# Patient Record
Sex: Male | Born: 1985
Health system: Southern US, Community
[De-identification: ages and names within clinical notes are randomized; demographics above are authoritative.]

## PROBLEM LIST (undated history)

## (undated) DIAGNOSIS — K5190 Ulcerative colitis, unspecified, without complications: Secondary | ICD-10-CM

## (undated) DIAGNOSIS — F419 Anxiety disorder, unspecified: Secondary | ICD-10-CM

## (undated) DIAGNOSIS — Z21 Asymptomatic human immunodeficiency virus [HIV] infection status: Secondary | ICD-10-CM

## (undated) DIAGNOSIS — E559 Vitamin D deficiency, unspecified: Secondary | ICD-10-CM

## (undated) DIAGNOSIS — F329 Major depressive disorder, single episode, unspecified: Secondary | ICD-10-CM

## (undated) DIAGNOSIS — Z8619 Personal history of other infectious and parasitic diseases: Secondary | ICD-10-CM

## (undated) DIAGNOSIS — F32A Depression, unspecified: Secondary | ICD-10-CM

## (undated) DIAGNOSIS — B20 Human immunodeficiency virus [HIV] disease: Secondary | ICD-10-CM

## (undated) HISTORY — DX: Major depressive disorder, single episode, unspecified: F32.9

## (undated) HISTORY — DX: Depression, unspecified: F32.A

## (undated) HISTORY — DX: Ulcerative colitis, unspecified, without complications: K51.90

## (undated) HISTORY — DX: Vitamin D deficiency, unspecified: E55.9

## (undated) HISTORY — DX: Anxiety disorder, unspecified: F41.9

## (undated) HISTORY — DX: Personal history of other infectious and parasitic diseases: Z86.19

## (undated) HISTORY — DX: Human immunodeficiency virus (HIV) disease: B20

## (undated) HISTORY — DX: Asymptomatic human immunodeficiency virus (hiv) infection status: Z21

## (undated) MED FILL — Emtricitabine-Rilpivirine-Tenofovir AF Tab 200-25-25 MG: ORAL | Fill #0 | Status: CN

---

## 2005-07-27 ENCOUNTER — Emergency Department: Payer: Self-pay | Admitting: Emergency Medicine

## 2005-10-31 ENCOUNTER — Emergency Department (HOSPITAL_COMMUNITY): Admission: EM | Admit: 2005-10-31 | Discharge: 2005-10-31 | Payer: Self-pay | Admitting: Family Medicine

## 2005-11-01 ENCOUNTER — Emergency Department: Payer: Self-pay | Admitting: Emergency Medicine

## 2006-02-16 ENCOUNTER — Ambulatory Visit: Payer: Self-pay | Admitting: Gastroenterology

## 2006-03-16 ENCOUNTER — Ambulatory Visit: Payer: Self-pay | Admitting: Gastroenterology

## 2007-08-27 ENCOUNTER — Emergency Department: Payer: Self-pay | Admitting: Emergency Medicine

## 2008-03-11 ENCOUNTER — Emergency Department: Payer: Self-pay | Admitting: Internal Medicine

## 2008-03-13 ENCOUNTER — Emergency Department: Payer: Self-pay | Admitting: Unknown Physician Specialty

## 2009-05-31 ENCOUNTER — Emergency Department: Payer: Self-pay | Admitting: Emergency Medicine

## 2009-06-29 ENCOUNTER — Ambulatory Visit: Payer: Self-pay | Admitting: Family

## 2009-07-03 ENCOUNTER — Emergency Department (HOSPITAL_COMMUNITY): Admission: EM | Admit: 2009-07-03 | Discharge: 2009-07-03 | Payer: Self-pay | Admitting: Emergency Medicine

## 2009-07-06 ENCOUNTER — Ambulatory Visit: Payer: Self-pay | Admitting: Infectious Diseases

## 2009-07-06 DIAGNOSIS — K5190 Ulcerative colitis, unspecified, without complications: Secondary | ICD-10-CM | POA: Insufficient documentation

## 2009-07-06 DIAGNOSIS — R509 Fever, unspecified: Secondary | ICD-10-CM | POA: Insufficient documentation

## 2009-07-06 LAB — CONVERTED CEMR LAB
Albumin: 3.8 g/dL (ref 3.5–5.2)
BUN: 8 mg/dL (ref 6–23)
CO2: 30 meq/L (ref 19–32)
Eosinophils Absolute: 0.1 10*3/uL (ref 0.0–0.7)
Eosinophils Relative: 1 % (ref 0–5)
HIV 1 RNA Quant: 355000 copies/mL — ABNORMAL HIGH (ref <48)
HIV-1 RNA Quant, Log: 5.55 — ABNORMAL HIGH (ref <1.68)
HIV: REACTIVE
Lymphs Abs: 0.8 10*3/uL (ref 0.7–4.0)
MCHC: 31.3 g/dL (ref 30.0–36.0)
Monocytes Absolute: 0.5 10*3/uL (ref 0.1–1.0)
Monocytes Relative: 6 % (ref 3–12)
Platelets: 127 10*3/uL — ABNORMAL LOW (ref 150–400)
Potassium: 3.7 meq/L (ref 3.5–5.3)
RBC: 4.26 M/uL (ref 4.22–5.81)
Sodium: 138 meq/L (ref 135–145)
Total Protein: 7.4 g/dL (ref 6.0–8.3)
WBC: 8.2 10*3/uL (ref 4.0–10.5)

## 2009-07-09 ENCOUNTER — Ambulatory Visit (HOSPITAL_COMMUNITY): Admission: RE | Admit: 2009-07-09 | Discharge: 2009-07-09 | Payer: Self-pay | Admitting: Infectious Diseases

## 2009-07-13 ENCOUNTER — Ambulatory Visit: Payer: Self-pay | Admitting: Infectious Diseases

## 2009-07-13 DIAGNOSIS — B20 Human immunodeficiency virus [HIV] disease: Secondary | ICD-10-CM | POA: Insufficient documentation

## 2009-07-13 LAB — CONVERTED CEMR LAB
Basophils Absolute: 0.1 10*3/uL (ref 0.0–0.1)
Basophils Relative: 1 % (ref 0–1)
Chlamydia, Swab/Urine, PCR: NEGATIVE
Eosinophils Relative: 0 % (ref 0–5)
GC Probe Amp, Urine: NEGATIVE
HCT: 34.4 % — ABNORMAL LOW (ref 39.0–52.0)
HCV Ab: NEGATIVE
HDL: 23 mg/dL — ABNORMAL LOW (ref >39)
Monocytes Absolute: 0.8 10*3/uL (ref 0.1–1.0)
Monocytes Relative: 9 % (ref 3–12)
Neutrophils Relative %: 77 % (ref 43–77)
RBC: 4.01 M/uL — ABNORMAL LOW (ref 4.22–5.81)
VLDL: 39 mg/dL (ref 0–40)
WBC: 8.7 10*3/uL (ref 4.0–10.5)

## 2009-07-22 ENCOUNTER — Ambulatory Visit: Payer: Self-pay | Admitting: Infectious Diseases

## 2009-07-29 ENCOUNTER — Telehealth: Payer: Self-pay

## 2009-08-21 ENCOUNTER — Encounter: Payer: Self-pay | Admitting: Infectious Diseases

## 2009-08-25 ENCOUNTER — Encounter: Payer: Self-pay | Admitting: Infectious Diseases

## 2009-09-02 ENCOUNTER — Ambulatory Visit: Payer: Self-pay | Admitting: Infectious Diseases

## 2009-09-02 LAB — CONVERTED CEMR LAB
HIV 1 RNA Quant: 63 copies/mL — ABNORMAL HIGH (ref <48)
HIV-1 RNA Quant, Log: 1.8 — ABNORMAL HIGH (ref <1.68)

## 2009-09-03 ENCOUNTER — Encounter: Payer: Self-pay | Admitting: Infectious Diseases

## 2009-11-02 ENCOUNTER — Ambulatory Visit: Payer: Self-pay | Admitting: Infectious Diseases

## 2010-01-07 ENCOUNTER — Encounter (INDEPENDENT_AMBULATORY_CARE_PROVIDER_SITE_OTHER): Payer: Self-pay | Admitting: *Deleted

## 2010-03-02 ENCOUNTER — Ambulatory Visit: Payer: Self-pay | Admitting: Infectious Diseases

## 2010-03-15 ENCOUNTER — Ambulatory Visit: Payer: Self-pay | Admitting: Infectious Diseases

## 2010-03-23 ENCOUNTER — Encounter: Payer: Self-pay | Admitting: Infectious Diseases

## 2010-05-03 ENCOUNTER — Telehealth: Payer: Self-pay | Admitting: Infectious Diseases

## 2010-05-18 NOTE — Assessment & Plan Note (Signed)
Summary: 70MONTH F/U/VS   CC:  2 month follow up.  History of Present Illness: 25 yo M with a hx of Ulcerative Coliits. Seen in ID clinic for FUO and found to have CD4 10 and VL 355,000 (07-06-09). Geno naive. Started on atripla 07-22-09.  CD4 40 and VL 63 (09-02-09). no problems. working. no problems with medicine. has some timing irregularity.   Preventive Screening-Counseling & Management  Alcohol-Tobacco     Alcohol drinks/day: 0     Smoking Status: never  Caffeine-Diet-Exercise     Caffeine use/day:  sodas and tea     Does Patient Exercise: no  Safety-Violence-Falls     Seat Belt Use: yes  Current Medications (verified): 1)  Atripla 600-200-300 Mg Tabs (Efavirenz-Emtricitab-Tenofovir) .Marland Kitchen.. 1 Tab At Bedtime  Allergies (verified): No Known Drug Allergies    Updated Prior Medication List: ATRIPLA 600-200-300 MG TABS (EFAVIRENZ-EMTRICITAB-TENOFOVIR) 1 tab at bedtime  Current Allergies (reviewed today): No known allergies  Past History:  Past medical, surgical, family and social histories (including risk factors) reviewed, and no changes noted (except as noted below).  Past Medical History: Reviewed history from 07/13/2009 and no changes required. Current Problems:  UNSPECIFIED ULCERATIVE COLITIS (ICD-556.9) FEVER UNSPECIFIED (ICD-780.60) HIV disease  Family History: Reviewed history from 07/06/2009 and no changes required. Family History Hypertension  Social History: Reviewed history from 07/06/2009 and no changes required. Never Smoked Alcohol use-no here with girlfriend.   Review of Systems       wt steady, eating well,   Vital Signs:  Patient profile:   25 year old male Height:      71 inches (180.34 cm) Weight:      151.8 pounds (69 kg) BMI:     21.25 Temp:     98.1 degrees F (36.72 degrees C) oral Pulse rate:   86 / minute BP sitting:   117 / 72  (left arm)  Vitals Entered By: Rocky Morel) (November 02, 2009 10:02 AM) CC: 2 month follow  up Pain Assessment Patient in pain? no      Nutritional Status Detail appetite is good per patient  Have you ever been in a relationship where you felt threatened, hurt or afraid?Unable to ask-friend in the room   Does patient need assistance? Functional Status Self care Ambulation Normal        Medication Adherence: 11/02/2009   Adherence to medications reviewed with patient. Counseling to provide adequate adherence provided   Prevention For Positives: 11/02/2009   Safe sex practices discussed with patient. Condoms offered.                             Physical Exam  General:  well-developed, well-nourished, and well-hydrated.   Eyes:  pupils equal, pupils round, and pupils reactive to light.   Mouth:  pharynx pink and moist and no exudates.   Neck:  no masses.   Lungs:  normal respiratory effort and normal breath sounds.   Heart:  normal rate, regular rhythm, and no murmur.   Abdomen:  soft, non-tender, and normal bowel sounds.     Impression & Recommendations:  Problem # 1:  HIV DISEASE (ICD-042) he is doing well with his meds. unfotunately is not recontituting his immune system. would like his chart to be made anonymous. return to clinic 4 months with labs prior. offered condoms. he will get vaccine records from hospital for [place].   Problem # 2:  FEVER UNSPECIFIED (ICD-780.60) Assessment:  Improved resolved.   Other Orders: Est. Patient Level III (09927) Future Orders: T-HIV Viral Load ([id number]) ... 01/31/2010 T-CD4SP ([facility]) (CD4SP) ... 01/31/2010

## 2010-05-18 NOTE — Letter (Signed)
Summary: [employer]: [employer]: 32 Statement   Imported By: Bonner Puna 08/27/2009 14:55:01  _____________________________________________________________________  External Attachment:    Type:   Image     Comment:   External Document

## 2010-05-18 NOTE — Miscellaneous (Signed)
Summary: [facility] [employer]   Imported By: Bonner Puna 09/03/2009 16:24:18  _____________________________________________________________________  External Attachment:    Type:   Image     Comment:   External Document

## 2010-05-18 NOTE — Assessment & Plan Note (Signed)
Summary: 1WK F/U/OK PER DR Carlyn Lemke/VS   CC:  1 week follow up.  History of Present Illness: 25 yo M with a hx of Ulcerative Coliits. Seen in ID clinic for FUO and found to have CD4 10 and VL 355,000 (07-06-09). Geno naive. States he has been having temps to 101. feeling sleepy.   Preventive Screening-Counseling & Management  Alcohol-Tobacco     Alcohol drinks/day: 0     Smoking Status: never  Caffeine-Diet-Exercise     Caffeine use/day:  sodas and tea     Does Patient Exercise: no  Safety-Violence-Falls     Seat Belt Use: yes   Current Allergies (reviewed today): No known allergies  Past History:  Past medical, surgical, family and social histories (including risk factors) reviewed, and no changes noted (except as noted below).  Past Medical History: Reviewed history from 07/13/2009 and no changes required. Current Problems:  UNSPECIFIED ULCERATIVE COLITIS (ICD-556.9) FEVER UNSPECIFIED (ICD-780.60) HIV disease  Family History: Reviewed history from 07/06/2009 and no changes required. Family History Hypertension  Social History: Reviewed history from 07/06/2009 and no changes required. Never Smoked Alcohol use-no here with girlfriend.   Review of Systems       see hpi. wt down 4#. no dysphagia. decreased apetite.   Vital Signs:  Patient profile:   25 year old male Height:      71 inches (180.34 cm) Weight:      161.8 pounds (73.55 kg) BMI:     22.65 Temp:     98.0 degrees F (36.67 degrees C) oral Pulse rate:   116 / minute BP sitting:   106 / 72  (left arm)  Vitals Entered By: Rocky Morel) (July 22, 2009 10:16 AM) CC: 1 week follow up Is Patient Diabetic? No Pain Assessment Patient in pain? no      Nutritional Status BMI of 19 -24 = normal Nutritional Status Detail appetite is not good per patient  Does patient need assistance? Functional Status Self care Ambulation Normal   Physical Exam  General:  well-developed, well-nourished,  and well-hydrated.   Eyes:  pupils equal, pupils round, and pupils reactive to light.   Mouth:  pharynx pink and moist.  mild thrush Neck:  no masses.   Lungs:  normal respiratory effort and normal breath sounds.   Heart:  normal rate, regular rhythm, and no murmur.   Abdomen:  soft, non-tender, and normal bowel sounds.          Medication Adherence: 07/22/2009   Adherence to medications reviewed with patient. Counseling to provide adequate adherence provided                                Current Medications (verified): 1)  Tylenol 325 Mg Tabs (Acetaminophen) .... As Needed  Allergies (verified): No Known Drug Allergies   Impression & Recommendations:  Problem # 1:  HIV DISEASE (ICD-042)  will start him on atripla and bactrim for OI prophylaxis. explained side effects to him (rash and abn dreams). he will call if he finds med intolerable. offered condoms. return to clinic 1 month. need dates of  flu, pnvx and to restart Hep B series (S Ab indeterminant).   His updated medication list for this problem includes:    Bactrim Ds 800-160 Mg Tabs (Sulfamethoxazole-trimethoprim) .Marland Kitchen... 1 tab by mouth mwf  Medications Added to Medication List This Visit: 1)  Atripla 600-200-300 Mg Tabs (Efavirenz-emtricitab-tenofovir) .Marland Kitchen.. 1 tab  at bedtime 2)  Bactrim Ds 800-160 Mg Tabs (Sulfamethoxazole-trimethoprim) .Marland Kitchen.. 1 tab by mouth mwf  Other Orders: Est. Patient Level IV (41282)  Prescriptions: BACTRIM DS 800-160 MG TABS (SULFAMETHOXAZOLE-TRIMETHOPRIM) 1 tab by mouth mwf  #30 x 3   Entered and Authorized by:   Bobby Rumpf MD   Signed by:   Bobby Rumpf MD on 07/22/2009   Method used:   Print then Give to Patient   RxID:   [id number] ATRIPLA 550-158-682 MG TABS (EFAVIRENZ-EMTRICITAB-TENOFOVIR) 1 tab at bedtime  #30 x 3   Entered and Authorized by:   Bobby Rumpf MD   Signed by:   Bobby Rumpf MD on 07/22/2009   Method used:   Print then Give to Patient   RxID:    [id number]

## 2010-05-18 NOTE — Assessment & Plan Note (Addendum)
Summary: 50MONTH F/U [MKJ]   CC:  follow-up visit and would like something for his appetite.  History of Present Illness: 25 yo M with a hx of Ulcerative Coliits. Seen in ID clinic for FUO and found to have CD4 10 and VL 355,000 (07-06-09). Geno naive. Started on atripla 07-22-09.  CD4 140 and VL <20 (03-02-10).  Has been feeling well, concerned he is loosing weight.   Preventive Screening-Counseling & Management  Alcohol-Tobacco     Alcohol drinks/day: 0     Smoking Status: never  Caffeine-Diet-Exercise     Caffeine use/day:  sodas and tea     Does Patient Exercise: no  Hep-HIV-STD-Contraception     HIV Risk: no risk noted     HIV Risk Counseling: not indicated-no HIV risk noted  Safety-Violence-Falls     Seat Belt Use: yes  Comments: declined condoms      Sexual History:  currently monogamous.        Drug Use:  never.     Updated Prior Medication List: ATRIPLA 600-200-300 MG TABS (EFAVIRENZ-EMTRICITAB-TENOFOVIR) 1 tab at bedtime  Current Allergies (reviewed today): No known allergies  Past History:  Past medical, surgical, family and social histories (including risk factors) reviewed, and no changes noted (except as noted below).  Past Medical History: Reviewed history from 07/13/2009 and no changes required. Current Problems:  UNSPECIFIED ULCERATIVE COLITIS (ICD-556.9) FEVER UNSPECIFIED (ICD-780.60) HIV disease  Family History: Reviewed history from 07/06/2009 and no changes required. Family History Hypertension  Social History: Reviewed history from 07/06/2009 and no changes required. Never Smoked Alcohol use-no here with girlfriend.  Sexual History:  currently monogamous  Review of Systems       wt down 9.8#. eating ok- early satiety, little apetite. eats 'constantly', no dysphagia, nl BM.   Vital Signs:  Patient profile:   25 year old male Height:      71 inches (180.34 cm) Weight:      142 pounds (64.55 kg) BMI:     19.88 Temp:     97.8  degrees F (36.56 degrees C) oral Pulse rate:   79 / minute BP sitting:   131 / 72  (left arm) Cuff size:   regular  Vitals Entered By: Lorne Skeens RN (March 15, 2010 10:42 AM) CC: follow-up visit, would like something for his appetite Is Patient Diabetic? No Pain Assessment Patient in pain? no      Nutritional Status BMI of 19 -24 = normal Nutritional Status Detail appetite "so-so"  Have you ever been in a relationship where you felt threatened, hurt or afraid?not asked today, friend present   Does patient need assistance? Functional Status Self care Ambulation Normal Comments no missed doses of rxes   Physical Exam  General:  well-developed, well-nourished, well-hydrated, and underweight appearing.   Eyes:  pupils equal, pupils round, and pupils reactive to light.   Mouth:  pharynx pink and moist and no exudates.   Neck:  no masses.   Lungs:  normal respiratory effort and normal breath sounds.   Heart:  normal rate, regular rhythm, and no murmur.   Abdomen:  soft, non-tender, and normal bowel sounds.          Medication Adherence: 03/15/2010   Adherence to medications reviewed with patient. Counseling to provide adequate adherence provided   Prevention For Positives: 03/15/2010   Safe sex practices discussed with patient. Condoms offered.  Impression & Recommendations:  Problem # 1:  HIV DISEASE (ICD-042) he is doing better despite his wt loss (CD4 has come up). Still hopeful it will get to over 200. will give him Rx for marinol to try and improve apetite. offered condoms. he has gotten flu shot. will re-start Hep B series. return to clinic 2-3 months.   Medications Added to Medication List This Visit: 1)  Marinol 10 Mg Caps (Dronabinol) .... Take 1 tablet by mouth once a day  Other Orders: Est. Patient Level IV (93552) Future Orders: T-CD4SP ([facility]) (CD4SP) ... 06/13/2010 T-HIV Viral Load [id number]) ...  06/13/2010 T-Comprehensive Metabolic Panel ([id number]) ... 06/13/2010 T-CBC w/Diff ([id number]) ... 06/13/2010 T-RPR (Syphilis) [phone]) ... 06/13/2010 T-Lipid Profile [id number]) ... 06/13/2010 Prescriptions: MARINOL 10 MG CAPS (DRONABINOL) Take 1 tablet by mouth once a day  #30 x 3   Entered and Authorized by:   Bobby Rumpf MD   Signed by:   Bobby Rumpf MD on 03/15/2010   Method used:   Print then Give to Patient   RxID:   [id number]     Appended Document: 53MONTH F/U. #1 Hep B   Hepatitis B Vaccine # 1    Vaccine Type: HepB Adult    Site: left deltoid    Mfr: Merck    Dose: 0.5 ml    Route: IM    Given by: Lorne Skeens RN    Exp. Date: 04/05/2012    Lot #: [id number]

## 2010-05-18 NOTE — Miscellaneous (Signed)
Summary: RW update  Clinical Lists Changes  Observations: Added new observation of RWPARTICIP: Yes (01/07/2010 9:54)

## 2010-05-18 NOTE — Miscellaneous (Signed)
Summary: HIPAA Restrictions  HIPAA Restrictions   Imported By: Bonner Puna 07/06/2009 14:44:00  _____________________________________________________________________  External Attachment:    Type:   Image     Comment:   External Document

## 2010-05-18 NOTE — Assessment & Plan Note (Signed)
Summary: 28monthf/u/vs   CC:  follow-up visit.  History of Present Illness: 25yo M with a hx of Ulcerative Coliits. Seen in ID clinic for FUO and found to have CD4 10 and VL 355,000 (07-06-09). Geno naive. States he has been having temps but improved. has otherwise "been sitting at the house, bored." no probs with medicine. has been having some n/v after taking meds. perhaps 1-2/week. worse when he has full stomach. skipped meds last pm because his stomach was full.   Preventive Screening-Counseling & Management  Alcohol-Tobacco     Alcohol drinks/day: 0     Smoking Status: never  Caffeine-Diet-Exercise     Caffeine use/day:  sodas and tea     Does Patient Exercise: no  Hep-HIV-STD-Contraception     HIV Risk: no risk noted  Safety-Violence-Falls     Seat Belt Use: yes  Comments: declined condoms      Sexual History:  n/a.        Drug Use:  never.     Updated Prior Medication List: TTZGYFVC 944MG TABS (ACETAMINOPHEN) as needed ATRIPLA 600-200-300 MG TABS (EFAVIRENZ-EMTRICITAB-TENOFOVIR) 1 tab at bedtime  Current Allergies (reviewed today): No known allergies  Past History:  Past medical, surgical, family and social histories (including risk factors) reviewed, and no changes noted (except as noted below).  Past Medical History: Reviewed history from 07/13/2009 and no changes required. Current Problems:  UNSPECIFIED ULCERATIVE COLITIS (ICD-556.9) FEVER UNSPECIFIED (ICD-780.60) HIV disease  Family History: Reviewed history from 07/06/2009 and no changes required. Family History Hypertension  Social History: Reviewed history from 07/06/2009 and no changes required. Never Smoked Alcohol use-no here with girlfriend.  Sexual History:  n/a Drug Use:  never  Review of Systems       no headaches. no sob, nl BM, nl urination.   Vital Signs:  Patient profile:   25year old male Height:      71 inches (180.34 cm) Weight:      148.5 pounds (67.50 kg) BMI:      20.79 Temp:     98.1 degrees F (36.72 degrees C) oral Pulse rate:   114 / minute BP sitting:   155 / 97  (left arm)  Vitals Entered By: DLorne SkeensRN (Sep 02, 2009 9:14 AM) CC: follow-up visit Is Patient Diabetic? No Pain Assessment Patient in pain? no      Nutritional Status BMI of 19 -24 = normal Nutritional Status Detail appetite "GREAT"  Have you ever been in a relationship where you felt threatened, hurt or afraid?not asked friend present   Does patient need assistance? Functional Status Self care Ambulation Normal Comments vomiting after taking medication at night. random times.  Stomach burns then he vomits   Serial Vital Signs/Assessments:  Time      Position  BP       Pulse  Resp  Temp     By 9:19 AM   R Arm     152/93                         DLorne SkeensRN        Medication Adherence: 09/02/2009   Adherence to medications reviewed with patient. Counseling to provide adequate adherence provided   Prevention For Positives: 09/02/2009   Safe sex practices discussed with patient. Condoms offered.  Physical Exam  General:  alert, well-developed, well-nourished, and well-hydrated.   Eyes:  pupils equal, pupils round, and pupils reactive to light.   Mouth:  pharynx pink and moist and no exudates.   Neck:  no masses.   Lungs:  normal respiratory effort and normal breath sounds.   Heart:  regular rhythm, tachycardia, and Grade  2 /6 systolic ejection murmur.   Abdomen:  soft, non-tender, and normal bowel sounds.     Impression & Recommendations:  Problem # 1:  HIV DISEASE (ICD-042) he is doing whe is well. will recheck his labs. will give PNVX. given Rx for zofran to help with nausea and adherence. adherence reinforced. offered condoms. asked him to limithis tylenol use to 530m tid. written notes for pt to return to work. return to clinic 2-3 months.  The following medications were removed from the medication list:     Bactrim Ds 800-160 Mg Tabs (Sulfamethoxazole-trimethoprim) ..Marland Kitchen.. 1 tab by mouth mwf  Problem # 2:  UNSPECIFIED ULCERATIVE COLITIS (ICD-556.9) states that ths is improving.   Medications Added to Medication List This Visit: 1)  Zofran 4 Mg Tabs (Ondansetron hcl) ..Marland Kitchen. 1 tab 30 minutes prior to taking art  Other Orders: Est. Patient Level IV ((12248 T-CD4SP ([facility]) (CD4SP) T-HIV Viral Load (([id number]  Prescriptions: ZOFRAN 4 MG TABS (ONDANSETRON HCL) 1 tab 30 minutes prior to taking ART  #30 x 3   Entered and Authorized by:   JBobby RumpfMD   Signed by:   JBobby RumpfMD on 09/02/2009   Method used:   Print then Give to Patient   RxID:   [id number]  Appended Document: 179month/u - pneumovax   Pneumovax Vaccine    Vaccine Type: Pneumovax    Site: left deltoid    Mfr: Merck    Dose: 0.5 ml    Route: IM    Given by: DeLorne SkeensN    Exp. Date: 02/10/2011    Lot #: [id number]

## 2010-05-18 NOTE — Assessment & Plan Note (Signed)
Summary: OK PER [facility]/new pt fever x 3 wks/ulcerative colits   History of Present Illness: 25 yo M with a hx of Ulcerative Coliits. Has had fevers for the last 3 weeks. Temp is releived with tylenol. Temp goes to 103. No pain, no diarrhea. Has nausea when his fever goes up, but not otherwise. No change in his UC signs. Has flare in January. No new meds. Has rx for UC but has not been taking due to fear that he would throw it up.  Was seen in ED 3-18 and had WBC 9, CXR showing linear atx vs infiltrate, UA +prot. UCx (-), BCx NGTD.   Preventive Screening-Counseling & Management  Alcohol-Tobacco     Alcohol drinks/day: 0     Smoking Status: never   Updated Prior Medication List: SFKCLEX 517 MG TABS (ACETAMINOPHEN) as needed  Current Allergies: No known allergies  Past History:  Past Medical History: Current Problems:  UNSPECIFIED ULCERATIVE COLITIS (ICD-556.9) FEVER UNSPECIFIED (ICD-780.60)  Family History: Family History Hypertension  Social History: Never Smoked Alcohol use-no here with girlfriend.   Review of Systems       wt is down 10# in last few weeks due to decreased apetite. early satiety. no oral ulcers, no rashes.   Vital Signs:  Patient profile:   25 year old male O2 Sat:      95 % on Room air Temp:     98.8 degrees F oral Pulse rate:   87 / minute BP sitting:   109 / 79  O2 Flow:  Room air  Physical Exam  General:  well-developed, well-nourished, and well-hydrated.   Eyes:  pupils equal, pupils round, and pupils reactive to light.   Mouth:  pharynx pink and moist and no exudates.  mild erythema at L tonsil.  Neck:  no masses.   Lungs:  normal respiratory effort and normal breath sounds.   Heart:  normal rate, regular rhythm, and no murmur.   Abdomen:  soft, non-tender, and normal bowel sounds.   Extremities:  no edema   Impression & Recommendations:  Problem # 1:  UNSPECIFIED ULCERATIVE COLITIS (ICD-556.9)  Will recheck his labs, HIV elisa/RNA.  Check CT of abd/pelvis. return to clinic 2 weeks. Suspect he has UC flair.   Orders: T-CBC w/Diff [phone]) T-HIV Antibody  (Reflex) [id number]) T-HIV1 Quant rflx Ultra or Genotype ([id number]) T-Culture, Blood Routine ([id number]) T-Sed Rate (Automated) ([id number]) CT with Contrast (CT w/ contrast) T-Comprehensive Metabolic Panel ([id number])  Medications Added to Medication List This Visit: 1)  Tylenol 325 Mg Tabs (Acetaminophen) .... As needed  Other Orders: Consultation Level IV (93734) Consultation Level IV (28768) Process Orders Check Orders Results:     [facility]: ABN not required for this insurance Tests Sent for requisitioning (July 06, 2009 12:12 PM):     07/06/2009: [facility] -- T-CBC w/Diff [[id number]] (signed)     07/06/2009: [facility] -- T-HIV Antibody  (Reflex) [[id number]] (signed)     07/06/2009: [facility] -- T-HIV1 Quant rflx Ultra or Genotype [id number] (signed)     07/06/2009: [facility] -- T-Culture, Blood Routine [[id number]] (signed)     07/06/2009: [facility] -- T-Sed Rate (Automated) [[id number]] (signed)     07/06/2009: [facility] -- T-Comprehensive Metabolic Panel [[id number]] (signed)   Appended Document: Orders Update    Clinical Lists Changes  Orders: Added new Test order of T-Culture, Blood Routine ([id number]) - Signed      Process Orders Check Orders Results:  [facility]: ABN not required for this insurance Tests Sent for requisitioning (October 20, 2009 2:40 PM):     07/06/2009: [facility] -- T-Culture, Blood Routine [[id number]] (signed)

## 2010-05-18 NOTE — Progress Notes (Signed)
Summary: Cont. FMLA?  Phone Note Call from Patient Call back at Home Phone [phone]   Caller: Patient Call For: Bobby Rumpf MD Reason for Call: Acute Illness Summary of Call: Pt. requesting extension of FMLA.  Last day of 2-week FMLA was 07/27/09.  Pt. still having problems with fatigue/malaise.  Requesting an additional 2 weeks of FMLA.  Please let Nevin Bloodgood, CMA know of your decision.  Lorne Skeens RN  July 29, 2009 11:33 AM fax to [phone] to Zebed[employer] Iba  Initial call taken by: Jarrett Ables CMA,  Aug 18, 2009 9:37 AM  Follow-up for Phone Call        I spoke with Zebedee Iba and informed her that the FMLA would be extended .  Dr Johnnye Sima should just write the script and fax to Zebedee Iba.  In speaking with her we realized he will need an intermittent FMLA completed due to his medical conditon.  She will fax the forms. Orland Mustard RN  Aug 21, 2009 12:28 PM   Additional Follow-up for Phone Call Additional follow up Details #1::        Forms received.  Orland Mustard RN  Aug 21, 2009 4:05 PM     Additional Follow-up for Phone Call Additional follow up Details #2::    Extension faxed to Zebedee Iba on 08-24-09 [facility] RN  Aug 26, 2009 11:41 AM

## 2010-05-18 NOTE — Assessment & Plan Note (Signed)
Summary: 1wk f/u/vs   CC:  1 week follow up.  History of Present Illness: 25 yo M with a hx of Ulcerative Coliits. Has had fevers for the last 3 weeks. Temp is releived with tylenol. Temp goes to 103. No pain, no diarrhea. Has nausea when his fever goes up, but not otherwise. No change in his UC signs. Has flare in January. No new meds. Has rx for UC but has not been taking due to fear that he would throw it up.  Was seen in ED 3-18 and had WBC 9, CXR showing linear atx vs infiltrate, UA +prot. UCx (-), BCx NGTD. At ID clinic was sched for CT abd/pelvis (-). Had HIV+ Today c/o feeling weak and tired. his fevers have resolved, no tylenol in 2 days. Normal BM, no abd pain.    Preventive Screening-Counseling & Management  Alcohol-Tobacco     Alcohol drinks/day: 0     Smoking Status: never  Caffeine-Diet-Exercise     Caffeine use/day:  sodas and tea     Does Patient Exercise: no  Safety-Violence-Falls     Seat Belt Use: yes   Updated Prior Medication List: TYLENOL 325 MG TABS (ACETAMINOPHEN) as needed  Current Allergies (reviewed today): No known allergies  Past History:  Past medical, surgical, family and social histories (including risk factors) reviewed, and no changes noted (except as noted below).  Past Medical History: Current Problems:  UNSPECIFIED ULCERATIVE COLITIS (ICD-556.9) FEVER UNSPECIFIED (ICD-780.60) HIV disease  Family History: Reviewed history from 07/06/2009 and no changes required. Family History Hypertension  Social History: Reviewed history from 07/06/2009 and no changes required. Never Smoked Alcohol use-no here with girlfriend.   Review of Systems       no oral ulcers, no dysphagia  Vital Signs:  Patient profile:   25 year old male Height:      71 inches (180.34 cm) Weight:      165.1 pounds (75.05 kg) BMI:     23.11 Temp:     99.1 degrees F (37.28 degrees C) oral Pulse rate:   122 / minute BP sitting:   106 / 71  (left arm)  Vitals  Entered By: Rocky Morel) (July 13, 2009 10:14 AM) CC: 1 week follow up Pain Assessment Patient in pain? no      Nutritional Status BMI of 19 -24 = normal Nutritional Status Detail appetite is not good per patient  Does patient need assistance? Functional Status Self care Ambulation Normal   Physical Exam  General:  well-developed, well-nourished, and well-hydrated.   Eyes:  pupils equal, pupils round, and pupils reactive to light.   Mouth:  pharynx pink and moist and no exudates.   Neck:  no masses.   Lungs:  normal respiratory effort and normal breath sounds.   Heart:  normal rate, regular rhythm, and no murmur.   Abdomen:  soft, non-tender, and normal bowel sounds.             Prevention For Positives: 07/13/2009   Safe sex practices discussed with patient. Condoms offered.                             Impression & Recommendations:  Problem # 1:  FEVER UNSPECIFIED (ICD-780.60) dx change to below  Problem # 2:  HIV DISEASE (ICD-042)  pt states that he has had high rsik exposures, not using condoms. He is offered condoms today but states he is no longer sexually  active. will check his CD4 and general STD screening. see him back in 1 week, hopeful to start ART then. His FMLA form is filled out.   Orders: T-CD4SP [facility]) (CD4SP) T-Chlamydia  Probe, urine [phone]) T-GC Probe, urine [phone]) T-RPR (Syphilis) [phone]) Est. Patient Level IV (48250) T-Hepatitis C Antibody (03704-88891) T-Hepatitis A Antibody (69450-38882) T-Hepatitis B Core Antibody (80034-91791) T-Hepatitis B Surface Antibody (50569-79480) T-Hepatitis B Surface Antigen (16553-74827) T-Lipid Profile ([id number])  Process Orders Check Orders Results:     [facility]: EFE not required for this insurance Tests Sent for requisitioning (July 13, 2009 10:40 AM):     07/13/2009: [facility] -- T-Chlamydia  Probe, urine [phone]  (signed)     07/13/2009: [facility] -- T-GC Probe, urine [phone] (signed)     07/13/2009: [facility] -- T-RPR (Syphilis) [phone] (signed)     07/13/2009: [facility] -- T-Hepatitis C Antibody [[id number]] (signed)     07/13/2009: [facility] -- T-Hepatitis A Antibody [[id number]] (signed)     07/13/2009: [facility] -- T-Hepatitis B Core Antibody [[id number]] (signed)     07/13/2009: [facility] -- T-Hepatitis B Surface Antibody [[id number]] (signed)     07/13/2009: [facility] -- T-Hepatitis B Surface Antigen [[id number]] (signed)     07/13/2009: [facility] -- T-Lipid Profile [phone] (signed)   Appended Document: Orders Update    Clinical Lists Changes  Orders: Added new Test order of T-CBC w/Diff [phone]) - Signed      Process Orders Check Orders Results:     [facility]: EBX not required for this insurance Tests Sent for requisitioning (July 13, 2009 11:12 AM):     07/13/2009: [facility] -- [facility] w/Diff [[id number]] (signed)

## 2010-05-18 NOTE — Letter (Signed)
Summary: FMLA  FMLA   Imported By: Bonner Puna 09/01/2009 15:17:49  _____________________________________________________________________  External Attachment:    Type:   Image     Comment:   External Document

## 2010-05-18 NOTE — Letter (Signed)
Summary: Connor [name]: FMLA 24 Statement  Connor [name]: FMLA 67 Statement   Imported By: Bonner Puna 09/03/2009 09:54:26  _____________________________________________________________________  External Attachment:    Type:   Image     Comment:   External Document

## 2010-05-20 NOTE — Progress Notes (Signed)
Summary: change of dose on Marinol  Phone Note Call from Patient   Caller: Patient Summary of Call: Pt. requesting a lower dose of Marinol stating the 10 mg is causing dizziness.  Per Dr. Edrick Oh to call in 2.5 mg. Initial call taken by: Myrtis Hopping CMA Deborra Medina),  May 03, 2010 2:53 PM    New/Updated Medications: MARINOL 2.5 MG CAPS (DRONABINOL) take one capsule one time a day Prescriptions: MARINOL 2.5 MG CAPS (DRONABINOL) take one capsule one time a day  #30 x 3   Entered by:   Myrtis Hopping CMA ( [place])   Authorized by:   Bobby Rumpf MD   Signed by:   Myrtis Hopping CMA ( [place]) on 05/03/2010   Method used:   Telephoned to ...       [id number]* (retail)       [place], [place]  [address]       Ph: [phone]       Fax: [phone]   RxID:   [id number]

## 2010-05-20 NOTE — Miscellaneous (Signed)
Summary: RW Update  Clinical Lists Changes  Observations: Added new observation of PAYOR: Private (03/23/2010 14:10) Added new observation of GENDER: Male (03/23/2010 14:10) Added new observation of RACE: African American (03/23/2010 14:10)

## 2010-05-31 ENCOUNTER — Encounter (INDEPENDENT_AMBULATORY_CARE_PROVIDER_SITE_OTHER): Payer: Self-pay | Admitting: *Deleted

## 2010-06-09 NOTE — Miscellaneous (Signed)
  Clinical Lists Changes  Observations: Added new observation of LATINO/HISP: No (05/31/2010 15:39) Added new observation of [place]COUNT[place]: [place] (05/31/2010 15:39)

## 2010-06-15 ENCOUNTER — Encounter: Payer: Self-pay | Admitting: Infectious Diseases

## 2010-06-16 ENCOUNTER — Encounter (INDEPENDENT_AMBULATORY_CARE_PROVIDER_SITE_OTHER): Payer: Self-pay | Admitting: *Deleted

## 2010-06-24 NOTE — Miscellaneous (Signed)
Summary: Atripla prior auth aproved until 04/17/2038.  Clinical Lists Changes  Prior authorization received from [employer] for pt.  Authorized time frame until 04/17/2038. Lorne Skeens RN  June 16, 2010 9:21 AM

## 2010-06-24 NOTE — Medication Information (Signed)
Summary: [employer]: Prior Auth  [employer]: Prior Auth   Imported By: Bonner Puna 06/16/2010 15:03:10  _____________________________________________________________________  External Attachment:    Type:   Image     Comment:   External Document

## 2010-06-29 ENCOUNTER — Other Ambulatory Visit: Payer: Self-pay | Admitting: Infectious Diseases

## 2010-06-29 ENCOUNTER — Encounter: Payer: Self-pay | Admitting: Infectious Diseases

## 2010-06-29 ENCOUNTER — Other Ambulatory Visit (INDEPENDENT_AMBULATORY_CARE_PROVIDER_SITE_OTHER): Payer: 59

## 2010-06-29 DIAGNOSIS — B20 Human immunodeficiency virus [HIV] disease: Secondary | ICD-10-CM

## 2010-06-29 LAB — CONVERTED CEMR LAB
ALT: 10 units/L (ref 0–53)
AST: 15 units/L (ref 0–37)
Albumin: 4.4 g/dL (ref 3.5–5.2)
Basophils Absolute: 0 10*3/uL (ref 0.0–0.1)
Basophils Relative: 0 % (ref 0–1)
Calcium: 9.6 mg/dL (ref 8.4–10.5)
Creatinine, Ser: 0.99 mg/dL (ref 0.40–1.50)
Eosinophils Absolute: 0.2 10*3/uL (ref 0.0–0.7)
Glucose, Bld: 105 mg/dL — ABNORMAL HIGH (ref 70–99)
HIV-1 RNA Quant, Log: <1.3 (ref <1.30)
Lymphocytes Relative: 59 % — ABNORMAL HIGH (ref 12–46)
Lymphs Abs: 5.7 10*3/uL — ABNORMAL HIGH (ref 0.7–4.0)
MCHC: 32.2 g/dL (ref 30.0–36.0)
Monocytes Absolute: 1 10*3/uL (ref 0.1–1.0)
Monocytes Relative: 10 % (ref 3–12)
Neutro Abs: 2.8 10*3/uL (ref 1.7–7.7)
Platelets: 194 10*3/uL (ref 150–400)
RBC: 4.36 M/uL (ref 4.22–5.81)
RDW: 12.9 % (ref 11.5–15.5)
Total CHOL/HDL Ratio: 3.5
Triglycerides: 118 mg/dL (ref <150)
VLDL: 24 mg/dL (ref 0–40)
WBC: 9.6 10*3/uL (ref 4.0–10.5)

## 2010-06-29 LAB — T-HELPER CELL (CD4) - (RCID CLINIC ONLY): CD4 T Cell Abs: 140 uL — ABNORMAL LOW (ref 400–2700)

## 2010-07-05 LAB — T-HELPER CELL (CD4) - (RCID CLINIC ONLY): CD4 % Helper T Cell: 3 % — ABNORMAL LOW (ref 33–55)

## 2010-07-12 ENCOUNTER — Ambulatory Visit: Payer: Self-pay | Admitting: Infectious Diseases

## 2010-07-12 LAB — URINE CULTURE: Culture: NO GROWTH

## 2010-07-12 LAB — MONONUCLEOSIS SCREEN: Mono Screen: NEGATIVE

## 2010-07-12 LAB — POCT I-STAT, CHEM 8
Calcium, Ion: 1.04 mmol/L — ABNORMAL LOW (ref 1.12–1.32)
Creatinine, Ser: 1 mg/dL (ref 0.4–1.5)
Glucose, Bld: 109 mg/dL — ABNORMAL HIGH (ref 70–99)
Hemoglobin: 11.6 g/dL — ABNORMAL LOW (ref 13.0–17.0)
Potassium: 3.5 mEq/L (ref 3.5–5.1)

## 2010-07-12 LAB — DIFFERENTIAL
Basophils Absolute: 0.1 10*3/uL (ref 0.0–0.1)
Basophils Relative: 1 % (ref 0–1)
Lymphocytes Relative: 6 % — ABNORMAL LOW (ref 12–46)
Lymphs Abs: 0.5 10*3/uL — ABNORMAL LOW (ref 0.7–4.0)
Neutro Abs: 7.8 10*3/uL — ABNORMAL HIGH (ref 1.7–7.7)

## 2010-07-12 LAB — CBC
HCT: 33.8 % — ABNORMAL LOW (ref 39.0–52.0)
Platelets: 80 10*3/uL — ABNORMAL LOW (ref 150–400)
RDW: 13 % (ref 11.5–15.5)

## 2010-07-12 LAB — URINALYSIS, ROUTINE W REFLEX MICROSCOPIC
Leukocytes, UA: NEGATIVE
Nitrite: NEGATIVE
Protein, ur: 100 mg/dL — AB
Urobilinogen, UA: 1 mg/dL (ref 0.0–1.0)
pH: 6 (ref 5.0–8.0)

## 2010-07-12 LAB — CULTURE, BLOOD (ROUTINE X 2)
Culture: NO GROWTH
Culture: NO GROWTH

## 2010-07-12 LAB — URINE MICROSCOPIC-ADD ON

## 2010-07-12 LAB — T-HELPER CELL (CD4) - (RCID CLINIC ONLY): CD4 % Helper T Cell: 1 % — ABNORMAL LOW (ref 33–55)

## 2010-07-13 ENCOUNTER — Ambulatory Visit (INDEPENDENT_AMBULATORY_CARE_PROVIDER_SITE_OTHER): Payer: 59 | Admitting: Infectious Diseases

## 2010-07-13 ENCOUNTER — Telehealth: Payer: Self-pay | Admitting: *Deleted

## 2010-07-13 ENCOUNTER — Encounter: Payer: Self-pay | Admitting: Infectious Diseases

## 2010-07-13 VITALS — BP 115/72 | HR 69 | Temp 97.8°F | Ht 71.0 in | Wt 145.5 lb

## 2010-07-13 DIAGNOSIS — B20 Human immunodeficiency virus [HIV] disease: Secondary | ICD-10-CM

## 2010-07-13 MED ORDER — MEGESTROL ACETATE 625 MG/5ML PO SUSP: 625.0000 mg | Freq: Every day | ORAL | Status: AC

## 2010-07-13 NOTE — Progress Notes (Signed)
  Subjective:    Patient ID: Mahin Guardia, male    DOB: 08/04/1983, 25 y.o.   MRN: [id number]  HPI 25 yo M with HIV/AIDS here for f/u. Has been taking atripla without difficulty. His most recent CD4 is 240 and VL is <20 (06-29-10). Without complaints today.     Review of Systems  Constitutional: Negative for unexpected weight change.  Respiratory: Negative for cough and shortness of breath.   Gastrointestinal: Negative for diarrhea and constipation.       Objective:   Physical Exam  Constitutional: He appears well-developed and well-nourished.  Eyes: EOM are normal. Pupils are equal, round, and reactive to light.  Neck: Normal range of motion. Neck supple.  Cardiovascular: Normal rate, regular rhythm and normal heart sounds.   Pulmonary/Chest: Effort normal and breath sounds normal.  Abdominal: Soft. Bowel sounds are normal. He exhibits no distension. There is no tenderness.          Assessment & Plan:

## 2010-07-13 NOTE — Telephone Encounter (Signed)
Here for appt. His insurance will only pay for 22 days of Atripla per month. He is going to have his insurance company fax a form here to see if we can override this. Has enough for another few weeks. Had him sign an application for Atripla. Form in triage office.

## 2010-07-13 NOTE — Assessment & Plan Note (Signed)
He is doing well. Offered condoms (refused). Will cont his current meds. He states he quit taking the marinol as it didn't make him feel himself. He would like to try something else so I will rx megace. Will see him back in 3 months to see how he tolerates this and what his wt does.

## 2010-08-02 ENCOUNTER — Telehealth: Payer: Self-pay | Admitting: Infectious Diseases

## 2010-08-02 NOTE — Telephone Encounter (Signed)
Connor [name] called back and said that he does not need assistance for Atripla, he has gotten everything straightened out with his Insurance.Connor [name]

## 2011-03-17 ENCOUNTER — Encounter: Payer: Self-pay | Admitting: *Deleted

## 2011-04-14 ENCOUNTER — Telehealth: Payer: Self-pay | Admitting: *Deleted

## 2011-04-14 NOTE — Telephone Encounter (Signed)
Called patient to have him reschedule an appointment and had to leave a message as he was not available.

## 2011-05-31 ENCOUNTER — Telehealth: Payer: Self-pay | Admitting: *Deleted

## 2011-05-31 DIAGNOSIS — B20 Human immunodeficiency virus [HIV] disease: Secondary | ICD-10-CM

## 2011-05-31 MED ORDER — EFAVIRENZ-EMTRICITAB-TENOFOVIR 600-200-300 MG PO TABS: 1.0000 | ORAL_TABLET | Freq: Every day | ORAL | Status: DC

## 2011-05-31 NOTE — Telephone Encounter (Signed)
He called for a refill on the atripla . Our chart has the year of his birth as [birth year]. He states it is 34 .  To office manager to correct   Transferred to front desk to make appt

## 2011-06-08 ENCOUNTER — Other Ambulatory Visit: Payer: Self-pay | Admitting: Infectious Diseases

## 2011-06-08 DIAGNOSIS — Z79899 Other long term (current) drug therapy: Secondary | ICD-10-CM

## 2011-06-08 DIAGNOSIS — B20 Human immunodeficiency virus [HIV] disease: Secondary | ICD-10-CM

## 2011-06-08 DIAGNOSIS — Z113 Encounter for screening for infections with a predominantly sexual mode of transmission: Secondary | ICD-10-CM

## 2011-06-08 NOTE — Telephone Encounter (Signed)
Gay Filler, I changed this the first time you sent to me. Thanks Roselyn Reef

## 2011-06-14 ENCOUNTER — Other Ambulatory Visit: Payer: Self-pay | Admitting: Infectious Diseases

## 2011-06-14 ENCOUNTER — Other Ambulatory Visit: Payer: 59

## 2011-06-14 DIAGNOSIS — Z21 Asymptomatic human immunodeficiency virus [HIV] infection status: Secondary | ICD-10-CM

## 2011-06-14 DIAGNOSIS — B20 Human immunodeficiency virus [HIV] disease: Secondary | ICD-10-CM

## 2011-06-14 DIAGNOSIS — Z79899 Other long term (current) drug therapy: Secondary | ICD-10-CM

## 2011-06-14 DIAGNOSIS — Z113 Encounter for screening for infections with a predominantly sexual mode of transmission: Secondary | ICD-10-CM

## 2011-06-14 LAB — URINALYSIS, ROUTINE W REFLEX MICROSCOPIC
Bilirubin Urine: NEGATIVE
Glucose, UA: NEGATIVE mg/dL
Specific Gravity, Urine: 1.021 (ref 1.005–1.030)

## 2011-06-14 LAB — LIPID PANEL
Cholesterol: 124 mg/dL (ref 0–200)
HDL: 29 mg/dL — ABNORMAL LOW (ref >39)
Triglycerides: 98 mg/dL (ref <150)

## 2011-06-14 LAB — COMPREHENSIVE METABOLIC PANEL
ALT: 13 U/L (ref 0–53)
AST: 19 U/L (ref 0–37)
Calcium: 9.1 mg/dL (ref 8.4–10.5)
Chloride: 108 mEq/L (ref 96–112)
Creat: 0.95 mg/dL (ref 0.50–1.35)
Sodium: 142 mEq/L (ref 135–145)
Total Bilirubin: 0.3 mg/dL (ref 0.3–1.2)
Total Protein: 7.2 g/dL (ref 6.0–8.3)

## 2011-06-14 LAB — CBC WITH DIFFERENTIAL/PLATELET
Basophils Absolute: 0 10*3/uL (ref 0.0–0.1)
HCT: 35.4 % — ABNORMAL LOW (ref 39.0–52.0)
Lymphocytes Relative: 55 % — ABNORMAL HIGH (ref 12–46)
Lymphs Abs: 5.2 10*3/uL — ABNORMAL HIGH (ref 0.7–4.0)
MCHC: 31.6 g/dL (ref 30.0–36.0)
Monocytes Absolute: 1.1 10*3/uL — ABNORMAL HIGH (ref 0.1–1.0)
Monocytes Relative: 11 % (ref 3–12)
Neutro Abs: 3.1 10*3/uL (ref 1.7–7.7)
Neutrophils Relative %: 33 % — ABNORMAL LOW (ref 43–77)
Platelets: 185 10*3/uL (ref 150–400)
RDW: 13.2 % (ref 11.5–15.5)

## 2011-06-15 LAB — GC/CHLAMYDIA PROBE AMP, URINE
Chlamydia, Swab/Urine, PCR: NEGATIVE
GC Probe Amp, Urine: NEGATIVE

## 2011-06-17 ENCOUNTER — Other Ambulatory Visit: Payer: Self-pay | Admitting: Infectious Diseases

## 2011-06-17 DIAGNOSIS — B20 Human immunodeficiency virus [HIV] disease: Secondary | ICD-10-CM

## 2011-06-27 LAB — HIV-1 GENOTYPR PLUS

## 2011-06-28 ENCOUNTER — Encounter: Payer: Self-pay | Admitting: Infectious Diseases

## 2011-06-28 ENCOUNTER — Ambulatory Visit (INDEPENDENT_AMBULATORY_CARE_PROVIDER_SITE_OTHER): Payer: 59 | Admitting: Infectious Diseases

## 2011-06-28 ENCOUNTER — Other Ambulatory Visit: Payer: Self-pay | Admitting: *Deleted

## 2011-06-28 ENCOUNTER — Telehealth: Payer: Self-pay | Admitting: *Deleted

## 2011-06-28 DIAGNOSIS — G4700 Insomnia, unspecified: Secondary | ICD-10-CM

## 2011-06-28 DIAGNOSIS — K5190 Ulcerative colitis, unspecified, without complications: Secondary | ICD-10-CM

## 2011-06-28 DIAGNOSIS — B20 Human immunodeficiency virus [HIV] disease: Secondary | ICD-10-CM

## 2011-06-28 MED ORDER — EMTRICITAB-RILPIVIR-TENOFOV DF 200-25-300 MG PO TABS: 1.0000 | ORAL_TABLET | Freq: Every day | ORAL | Status: DC

## 2011-06-28 MED ORDER — ZOLPIDEM TARTRATE 10 MG PO TABS: 10.0000 mg | ORAL_TABLET | Freq: Every evening | ORAL | Status: DC | PRN

## 2011-06-28 NOTE — Progress Notes (Signed)
  Subjective:    Patient ID: Connor [name], male    DOB: [birth year], 26 y.o.   MRN: [id number]  HPI 26 yo M with HIV/AIDS here for f/u. HIV 1 RNA Quant (copies/mL)  Date Value  06/14/2011 2259*  06/29/2010 <20 copies/mL   03/02/2010 <20 copies/mL      CD4 T Cell Abs (cmm)  Date Value  06/14/2011 330*  06/29/2010 240*  03/02/2010 140*    At last lab had detectable VL, had genotype done which showed naive virus.  Has been off medicine for several months. Not sure why he quit taking. Has had difficulty with sleep. Feels drained in the AM's. Seeing GI in the AM, having colitis flare up currently. Having constant diarrhea.    Review of Systems  Constitutional: Negative for appetite change and unexpected weight change.  HENT: Negative for mouth sores.   Gastrointestinal: Positive for diarrhea. Negative for constipation.  Genitourinary: Negative for dysuria.  Psychiatric/Behavioral: Positive for sleep disturbance.       Objective:   Physical Exam  Constitutional: He appears well-developed and well-nourished.  HENT:  Mouth/Throat: Oropharynx is clear and moist. No oropharyngeal exudate.  Eyes: EOM are normal. Pupils are equal, round, and reactive to light.  Neck: Neck supple.  Cardiovascular: Normal rate, regular rhythm and normal heart sounds.   Pulmonary/Chest: Effort normal and breath sounds normal.  Abdominal: Soft. Bowel sounds are normal. He exhibits no distension.  Lymphadenopathy:    He has no cervical adenopathy.          Assessment & Plan:

## 2011-06-28 NOTE — Assessment & Plan Note (Signed)
Has f/u appt with GI. Greatly appreciate their partnering with [place].

## 2011-06-28 NOTE — Assessment & Plan Note (Signed)
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 Will change him to complera (he had a low VL and good CD4 at last lab, off rx). counseled him to take with food. Will see hm back in 3 months. Need to check his Hep A serologies. Other vaccines up to date. Offered/refused condoms.

## 2011-06-28 NOTE — Assessment & Plan Note (Signed)
Will write him rx for ambien.

## 2011-06-28 NOTE — Telephone Encounter (Signed)
Prior authorization request faxed by [facility], [address] [phone]) [place], [place].  Phone call to [place] [phone]).  Prior Authorization must be completed by the MD on paper not over the phone.  Request to be faxed by [employer].  Fax arrived and placed in Dr. Dellis Filbert Hatcher's box for completion.

## 2011-07-04 ENCOUNTER — Other Ambulatory Visit: Payer: Self-pay | Admitting: *Deleted

## 2011-07-04 DIAGNOSIS — B20 Human immunodeficiency virus [HIV] disease: Secondary | ICD-10-CM

## 2011-07-04 MED ORDER — EMTRICITAB-RILPIVIR-TENOFOV DF 200-25-300 MG PO TABS: 1.0000 | ORAL_TABLET | Freq: Every day | ORAL | Status: DC

## 2011-07-04 NOTE — Telephone Encounter (Signed)
[employer] Prior Authorization approved for Complera rx, [id number].  Pharmacy to call pt when rx ready for pick-up.

## 2012-02-29 ENCOUNTER — Other Ambulatory Visit: Payer: Self-pay | Admitting: *Deleted

## 2012-02-29 ENCOUNTER — Telehealth: Payer: Self-pay | Admitting: *Deleted

## 2012-02-29 DIAGNOSIS — B20 Human immunodeficiency virus [HIV] disease: Secondary | ICD-10-CM

## 2012-02-29 NOTE — Telephone Encounter (Signed)
Called patient to try and have him come in as he is unsuppressed and he advised he just sleeps and works. Was able to work with is schedule and give him a lab appt for 03/05/12 and doctor appt for 03/20/12 which is a Tuesday and Lonna Duval can possibly speak with him about adherence.

## 2012-03-05 ENCOUNTER — Other Ambulatory Visit (INDEPENDENT_AMBULATORY_CARE_PROVIDER_SITE_OTHER): Payer: 59

## 2012-03-05 DIAGNOSIS — B20 Human immunodeficiency virus [HIV] disease: Secondary | ICD-10-CM

## 2012-03-05 LAB — CBC WITH DIFFERENTIAL/PLATELET
Eosinophils Absolute: 0.1 10*3/uL (ref 0.0–0.7)
Eosinophils Relative: 1 % (ref 0–5)
Hemoglobin: 13.2 g/dL (ref 13.0–17.0)
Lymphocytes Relative: 52 % — ABNORMAL HIGH (ref 12–46)
Lymphs Abs: 5.1 10*3/uL — ABNORMAL HIGH (ref 0.7–4.0)
MCH: 30.3 pg (ref 26.0–34.0)
MCV: 88.3 fL (ref 78.0–100.0)
Monocytes Relative: 9 % (ref 3–12)
RBC: 4.36 MIL/uL (ref 4.22–5.81)
WBC: 9.7 10*3/uL (ref 4.0–10.5)

## 2012-03-05 LAB — COMPLETE METABOLIC PANEL WITH GFR
ALT: 10 U/L (ref 0–53)
AST: 18 U/L (ref 0–37)
Alkaline Phosphatase: 76 U/L (ref 39–117)
BUN: 13 mg/dL (ref 6–23)
Creat: 1.15 mg/dL (ref 0.50–1.35)
Total Bilirubin: 0.5 mg/dL (ref 0.3–1.2)

## 2012-03-06 LAB — T-HELPER CELL (CD4) - (RCID CLINIC ONLY)
CD4 % Helper T Cell: 9 % — ABNORMAL LOW (ref 33–55)
CD4 T Cell Abs: 430 uL (ref 400–2700)

## 2012-03-06 LAB — HEPATITIS A ANTIBODY, IGM: Hep A IgM: NEGATIVE

## 2012-03-06 LAB — HIV-1 RNA QUANT-NO REFLEX-BLD: HIV 1 RNA Quant: 24 copies/mL — ABNORMAL HIGH (ref <20)

## 2012-03-20 ENCOUNTER — Encounter: Payer: Self-pay | Admitting: Infectious Diseases

## 2012-03-20 ENCOUNTER — Ambulatory Visit (INDEPENDENT_AMBULATORY_CARE_PROVIDER_SITE_OTHER): Payer: 59 | Admitting: Infectious Diseases

## 2012-03-20 VITALS — BP 120/70 | HR 70 | Temp 97.7°F | Ht 71.0 in | Wt 175.8 lb

## 2012-03-20 DIAGNOSIS — Z113 Encounter for screening for infections with a predominantly sexual mode of transmission: Secondary | ICD-10-CM

## 2012-03-20 DIAGNOSIS — Z79899 Other long term (current) drug therapy: Secondary | ICD-10-CM

## 2012-03-20 DIAGNOSIS — K5190 Ulcerative colitis, unspecified, without complications: Secondary | ICD-10-CM

## 2012-03-20 DIAGNOSIS — G4700 Insomnia, unspecified: Secondary | ICD-10-CM

## 2012-03-20 DIAGNOSIS — B20 Human immunodeficiency virus [HIV] disease: Secondary | ICD-10-CM

## 2012-03-20 NOTE — Progress Notes (Signed)
  Subjective:    Patient ID: Connor [name], male    DOB: Dec 04, 1985, 26 y.o.   MRN: [id number]  HPI  26 yo M with hx of HIV/AIDS, was having difficulty with his ART previously ( headaches, trouble sleeping). Was changed to complera at his previous visit April 2013.  Has been taking his ART without difficulty, noted no side effects.   HIV 1 RNA Quant (copies/mL)  Date Value  03/05/2012 24*  06/14/2011 2259*  06/29/2010 <20 copies/mL      CD4 T Cell Abs (cmm)  Date Value  03/05/2012 430   06/14/2011 330*  06/29/2010 240*     Review of Systems  Constitutional: Negative for appetite change and unexpected weight change.  Gastrointestinal: Negative for diarrhea and constipation.  Genitourinary: Negative for difficulty urinating.       Objective:   Physical Exam  Constitutional: He appears well-developed and well-nourished.  Eyes: EOM are normal. Pupils are equal, round, and reactive to light.  Neck: Neck supple.  Cardiovascular: Normal rate, regular rhythm and normal heart sounds.   Pulmonary/Chest: Effort normal and breath sounds normal.  Abdominal: Soft. Bowel sounds are normal. There is no tenderness. There is no rebound.  Lymphadenopathy:    He has no cervical adenopathy.          Assessment & Plan:

## 2012-03-20 NOTE — Assessment & Plan Note (Signed)
He's doing very well after med switch. Hopefully  <20 soon. will continue complera. Has gotten flu shot, PNVX uptodate. . Offered/refused condoms.  rtc 6 months.

## 2012-03-20 NOTE — Assessment & Plan Note (Signed)
Related to working 3rd shift.

## 2012-03-20 NOTE — Assessment & Plan Note (Signed)
Appears to be doing well, no abn BM per pt.

## 2012-07-03 ENCOUNTER — Other Ambulatory Visit: Payer: Self-pay | Admitting: Infectious Diseases

## 2012-07-03 DIAGNOSIS — B20 Human immunodeficiency virus [HIV] disease: Secondary | ICD-10-CM

## 2012-07-07 ENCOUNTER — Other Ambulatory Visit: Payer: Self-pay | Admitting: Infectious Diseases

## 2012-07-07 DIAGNOSIS — B20 Human immunodeficiency virus [HIV] disease: Secondary | ICD-10-CM

## 2012-09-18 ENCOUNTER — Other Ambulatory Visit: Payer: 59

## 2012-10-02 ENCOUNTER — Ambulatory Visit: Payer: 59 | Admitting: Infectious Diseases

## 2012-10-03 ENCOUNTER — Encounter: Payer: Self-pay | Admitting: Infectious Diseases

## 2012-10-03 ENCOUNTER — Ambulatory Visit (INDEPENDENT_AMBULATORY_CARE_PROVIDER_SITE_OTHER): Payer: 59 | Admitting: Infectious Diseases

## 2012-10-03 VITALS — BP 121/74 | HR 74 | Temp 98.0°F | Ht 71.0 in | Wt 181.0 lb

## 2012-10-03 DIAGNOSIS — Z79899 Other long term (current) drug therapy: Secondary | ICD-10-CM

## 2012-10-03 DIAGNOSIS — Z113 Encounter for screening for infections with a predominantly sexual mode of transmission: Secondary | ICD-10-CM

## 2012-10-03 DIAGNOSIS — K5190 Ulcerative colitis, unspecified, without complications: Secondary | ICD-10-CM

## 2012-10-03 DIAGNOSIS — B20 Human immunodeficiency virus [HIV] disease: Secondary | ICD-10-CM

## 2012-10-03 LAB — CBC
Hemoglobin: 13 g/dL (ref 13.0–17.0)
MCH: 29.6 pg (ref 26.0–34.0)
Platelets: 173 10*3/uL (ref 150–400)
RBC: 4.39 MIL/uL (ref 4.22–5.81)
WBC: 6.3 10*3/uL (ref 4.0–10.5)

## 2012-10-03 LAB — LIPID PANEL
Cholesterol: 125 mg/dL (ref 0–200)
LDL Cholesterol: 72 mg/dL (ref 0–99)
Triglycerides: 65 mg/dL (ref <150)
VLDL: 13 mg/dL (ref 0–40)

## 2012-10-03 LAB — COMPREHENSIVE METABOLIC PANEL
ALT: 11 U/L (ref 0–53)
Albumin: 4.3 g/dL (ref 3.5–5.2)
Alkaline Phosphatase: 68 U/L (ref 39–117)
CO2: 23 mEq/L (ref 19–32)
Glucose, Bld: 101 mg/dL — ABNORMAL HIGH (ref 70–99)
Potassium: 4.1 mEq/L (ref 3.5–5.3)
Sodium: 141 mEq/L (ref 135–145)
Total Bilirubin: 0.4 mg/dL (ref 0.3–1.2)
Total Protein: 7.2 g/dL (ref 6.0–8.3)

## 2012-10-03 NOTE — Assessment & Plan Note (Signed)
He is doing very well. Denies missed. I offered/he refused condoms. i encouraged him to exercise, eat properly, and wear his seat belt. Will see him back in 6 months.

## 2012-10-03 NOTE — Addendum Note (Signed)
Addended byMarlis Edelson on: 10/03/2012 10:38 AM   Modules accepted: Orders

## 2012-10-03 NOTE — Assessment & Plan Note (Signed)
This appears to be quiescent at the moment. Will continue to monitor.

## 2012-10-03 NOTE — Progress Notes (Signed)
  Subjective:    Patient ID: Connor [name], male    DOB: 07/28/1985, 27 y.o.   MRN: [id number]  HPI 27 yo M with hx of HIV/AIDS, was having difficulty with his ART previously ( headaches, trouble sleeping). Was changed to complera at his previous visit April 2013.  Without complaints. 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    HIV 1 RNA Quant (copies/mL)  Date Value  03/05/2012 24*  06/14/2011 2259*  06/29/2010 <20 copies/mL      CD4 T Cell Abs (cmm)  Date Value  03/05/2012 430   06/14/2011 330*  06/29/2010 240*      Review of Systems  Constitutional: Negative for fever.  Gastrointestinal: Negative for diarrhea and constipation.  Genitourinary: Negative for difficulty urinating.  Psychiatric/Behavioral: Negative for sleep disturbance.       Objective:   Physical Exam  Constitutional: He appears well-developed and well-nourished.  HENT:  Mouth/Throat: No oropharyngeal exudate.  Eyes: EOM are normal. Pupils are equal, round, and reactive to light.  Neck: Neck supple.  Cardiovascular: Normal rate, regular rhythm and normal heart sounds.   Pulmonary/Chest: Effort normal and breath sounds normal.  Abdominal: Soft. Bowel sounds are normal. He exhibits no distension.  Lymphadenopathy:    He has no cervical adenopathy.          Assessment & Plan:

## 2012-10-04 LAB — T-HELPER CELL (CD4) - (RCID CLINIC ONLY): CD4 % Helper T Cell: 12 % — ABNORMAL LOW (ref 33–55)

## 2012-10-05 LAB — HIV-1 RNA QUANT-NO REFLEX-BLD
HIV 1 RNA Quant: <20 copies/mL (ref <20)
HIV-1 RNA Quant, Log: <1.3 {Log} (ref <1.30)

## 2013-02-21 ENCOUNTER — Other Ambulatory Visit: Payer: Self-pay

## 2013-04-03 ENCOUNTER — Other Ambulatory Visit: Payer: 59

## 2013-04-03 DIAGNOSIS — B20 Human immunodeficiency virus [HIV] disease: Secondary | ICD-10-CM

## 2013-04-03 LAB — CBC WITH DIFFERENTIAL/PLATELET
Basophils Absolute: 0 10*3/uL (ref 0.0–0.1)
Basophils Relative: 0 % (ref 0–1)
Eosinophils Absolute: 0.1 10*3/uL (ref 0.0–0.7)
HCT: 37.8 % — ABNORMAL LOW (ref 39.0–52.0)
Hemoglobin: 12.8 g/dL — ABNORMAL LOW (ref 13.0–17.0)
MCH: 29.8 pg (ref 26.0–34.0)
MCHC: 33.9 g/dL (ref 30.0–36.0)
Monocytes Absolute: 0.7 10*3/uL (ref 0.1–1.0)
Monocytes Relative: 10 % (ref 3–12)
Neutro Abs: 2.8 10*3/uL (ref 1.7–7.7)
Neutrophils Relative %: 41 % — ABNORMAL LOW (ref 43–77)
RDW: 13.5 % (ref 11.5–15.5)

## 2013-04-03 LAB — COMPLETE METABOLIC PANEL WITH GFR
ALT: <8 U/L (ref 0–53)
Albumin: 4.2 g/dL (ref 3.5–5.2)
CO2: 27 mEq/L (ref 19–32)
GFR, Est African American: >89 mL/min
GFR, Est Non African American: 88 mL/min
Glucose, Bld: 94 mg/dL (ref 70–99)
Potassium: 3.9 mEq/L (ref 3.5–5.3)
Sodium: 138 mEq/L (ref 135–145)
Total Bilirubin: 0.6 mg/dL (ref 0.3–1.2)
Total Protein: 7.2 g/dL (ref 6.0–8.3)

## 2013-04-17 ENCOUNTER — Ambulatory Visit: Payer: 59 | Admitting: Infectious Diseases

## 2013-04-24 ENCOUNTER — Encounter: Payer: Self-pay | Admitting: Infectious Diseases

## 2013-04-24 ENCOUNTER — Ambulatory Visit (INDEPENDENT_AMBULATORY_CARE_PROVIDER_SITE_OTHER): Payer: 59 | Admitting: Infectious Diseases

## 2013-04-24 VITALS — BP 125/69 | HR 78 | Temp 97.7°F | Ht 71.0 in | Wt 197.0 lb

## 2013-04-24 DIAGNOSIS — Z79899 Other long term (current) drug therapy: Secondary | ICD-10-CM

## 2013-04-24 DIAGNOSIS — Z113 Encounter for screening for infections with a predominantly sexual mode of transmission: Secondary | ICD-10-CM

## 2013-04-24 DIAGNOSIS — K5190 Ulcerative colitis, unspecified, without complications: Secondary | ICD-10-CM

## 2013-04-24 DIAGNOSIS — G4700 Insomnia, unspecified: Secondary | ICD-10-CM

## 2013-04-24 DIAGNOSIS — B20 Human immunodeficiency virus [HIV] disease: Secondary | ICD-10-CM

## 2013-04-24 LAB — RPR

## 2013-04-24 LAB — LIPID PANEL
CHOLESTEROL: 138 mg/dL (ref 0–200)
HDL: 42 mg/dL (ref >39)
LDL CALC: 85 mg/dL (ref 0–99)
Total CHOL/HDL Ratio: 3.3 Ratio
Triglycerides: 57 mg/dL (ref <150)
VLDL: 11 mg/dL (ref 0–40)

## 2013-04-24 NOTE — Assessment & Plan Note (Signed)
Currently non-active.

## 2013-04-24 NOTE — Assessment & Plan Note (Signed)
Most likely related to [place], work schedule (3rd shift).

## 2013-04-24 NOTE — Progress Notes (Signed)
   Subjective:    Patient ID: Connor [name], male    DOB: 1985/10/13, 28 y.o.   MRN: [id number]  HPI 28 yo M with hx of HIV/AIDS, was having difficulty with his ART previously ( headaches, trouble sleeping). Was changed to [facility] April 2013. Doing well with complera except would like to take different rx that does not have food or sleep restrictions.  Has had Hep B series at least twice.   HIV 1 RNA Quant (copies/mL)  Date Value  04/03/2013 <20   10/03/2012 <20   03/05/2012 24*     CD4 T Cell Abs (/uL)  Date Value  04/03/2013 340*  10/03/2012 300*  03/05/2012 430      Review of Systems  Constitutional: Negative for appetite change and unexpected weight change.  Gastrointestinal: Negative for diarrhea and constipation.  Genitourinary: Negative for difficulty urinating.  Neurological: Negative for headaches.  Psychiatric/Behavioral: Negative for sleep disturbance.       Objective:   Physical Exam  Constitutional: He appears well-developed and well-nourished.  HENT:  Mouth/Throat: No oropharyngeal exudate.  Eyes: EOM are normal. Pupils are equal, round, and reactive to light.  Neck: Neck supple.  Cardiovascular: Normal rate, regular rhythm and normal heart sounds.   Pulmonary/Chest: Effort normal and breath sounds normal.  Abdominal: Soft. Bowel sounds are normal. He exhibits no distension. There is no tenderness.  Lymphadenopathy:    He has no cervical adenopathy.          Assessment & Plan:

## 2013-04-24 NOTE — Assessment & Plan Note (Signed)
Will check his HLA testing to see if he can be switched triumeq. He has no hx of anal lesions or warts. His flu shot is uptodate, he has received multiple courses of hep B. wil check his lab and call him back to change to triumeq if hla (-). Otherwise rtc in 3-4 months.

## 2013-04-27 LAB — HLA B*5701: HLA-B*5701 w/rflx HLA-B High: NEGATIVE

## 2013-05-08 ENCOUNTER — Encounter: Payer: Self-pay | Admitting: Infectious Diseases

## 2013-05-08 ENCOUNTER — Ambulatory Visit (INDEPENDENT_AMBULATORY_CARE_PROVIDER_SITE_OTHER): Payer: 59 | Admitting: Infectious Diseases

## 2013-05-08 VITALS — BP 119/75 | HR 73 | Temp 98.5°F | Ht 71.0 in | Wt 197.0 lb

## 2013-05-08 DIAGNOSIS — B20 Human immunodeficiency virus [HIV] disease: Secondary | ICD-10-CM

## 2013-05-08 MED ORDER — ABACAVIR-DOLUTEGRAVIR-LAMIVUD 600-50-300 MG PO TABS: 1.0000 | ORAL_TABLET | Freq: Every day | ORAL | Status: DC

## 2013-05-08 NOTE — Progress Notes (Signed)
   Subjective:    Patient ID: Connor [name], male    DOB: 1985-06-04, 28 y.o.   MRN: [id number]  HPI 28 yo M with HIV+, has been having trouble taking his complera due to food requirement. Here to f/u on HLA testing.  No complaints.   Review of Systems     Objective:   Physical Exam  Constitutional: He appears well-developed and well-nourished.          Assessment & Plan:

## 2013-05-08 NOTE — Assessment & Plan Note (Signed)
Doing well. Will change him to triumeq due to problems with missed doses due to food requirements.  Will see him back in 3 months.

## 2013-07-23 ENCOUNTER — Other Ambulatory Visit: Payer: 59

## 2013-08-07 ENCOUNTER — Telehealth: Payer: Self-pay | Admitting: *Deleted

## 2013-08-07 ENCOUNTER — Ambulatory Visit: Payer: 59 | Admitting: Infectious Diseases

## 2013-08-07 NOTE — Telephone Encounter (Signed)
Made new appts for the pt.

## 2013-08-15 ENCOUNTER — Other Ambulatory Visit: Payer: 59

## 2013-09-23 ENCOUNTER — Ambulatory Visit: Payer: 59 | Admitting: Infectious Diseases

## 2013-10-30 ENCOUNTER — Encounter: Payer: Self-pay | Admitting: Infectious Diseases

## 2013-10-30 ENCOUNTER — Ambulatory Visit (INDEPENDENT_AMBULATORY_CARE_PROVIDER_SITE_OTHER): Payer: 59 | Admitting: Infectious Diseases

## 2013-10-30 VITALS — BP 119/74 | HR 72 | Temp 97.9°F | Ht 71.0 in | Wt 206.0 lb

## 2013-10-30 DIAGNOSIS — B20 Human immunodeficiency virus [HIV] disease: Secondary | ICD-10-CM

## 2013-10-30 DIAGNOSIS — G4700 Insomnia, unspecified: Secondary | ICD-10-CM

## 2013-10-30 LAB — CBC
HCT: 38.9 % — ABNORMAL LOW (ref 39.0–52.0)
Hemoglobin: 13 g/dL (ref 13.0–17.0)
MCH: 30.8 pg (ref 26.0–34.0)
MCHC: 33.4 g/dL (ref 30.0–36.0)
MCV: 92.2 fL (ref 78.0–100.0)
Platelets: 167 10*3/uL (ref 150–400)
RBC: 4.22 MIL/uL (ref 4.22–5.81)
RDW: 13.1 % (ref 11.5–15.5)
WBC: 9 10*3/uL (ref 4.0–10.5)

## 2013-10-30 NOTE — Progress Notes (Signed)
   Subjective:    Patient ID: Connor [name], male    DOB: [birth year], 28 y.o.   MRN: [id number]  HPI 28 yo M with hx of HIV/AIDS, was having difficulty with his ART previously ( headaches, trouble sleeping). Was changed to complera April 2013, then to triumeq January 2015 (due to food issues). Has received Hep B series x 2.   HIV 1 RNA Quant (copies/mL)  Date Value  04/03/2013 <20   10/03/2012 <20   03/05/2012 24*     CD4 T Cell Abs (/uL)  Date Value  04/03/2013 340*  10/03/2012 300*  03/05/2012 430    Without complaints, no problems with ART. Has not had labs yet.  Wearing seat belt.   Review of Systems  Constitutional: Negative for appetite change and unexpected weight change.  Gastrointestinal: Negative for diarrhea and constipation.  Genitourinary: Negative for difficulty urinating.  Psychiatric/Behavioral: Negative for sleep disturbance.       Objective:   Physical Exam  Constitutional: He appears well-developed and well-nourished.  HENT:  Mouth/Throat: No oropharyngeal exudate.  Eyes: EOM are normal. Pupils are equal, round, and reactive to light.  Neck: Neck supple.  Cardiovascular: Normal rate, regular rhythm and normal heart sounds.   Pulmonary/Chest: Effort normal and breath sounds normal.  Abdominal: Soft. Bowel sounds are normal. There is no tenderness.  Lymphadenopathy:    He has no cervical adenopathy.          Assessment & Plan:

## 2013-10-30 NOTE — Assessment & Plan Note (Signed)
Doing very well. Will recheck his labs today. He has gotten Hep B x2, will not repeat.  Offered/refused condoms. rtc 6 months.

## 2013-10-30 NOTE — Assessment & Plan Note (Signed)
Appears to [place] for now.Marland KitchenMarland Kitchen

## 2013-10-31 LAB — HIV-1 RNA QUANT-NO REFLEX-BLD
HIV 1 RNA Quant: <20 copies/mL (ref <20)
HIV-1 RNA Quant, Log: <1.3 {Log} (ref <1.30)

## 2013-10-31 LAB — T-HELPER CELL (CD4) - (RCID CLINIC ONLY)
CD4 % Helper T Cell: 11 % — ABNORMAL LOW (ref 33–55)
CD4 T Cell Abs: 410 /uL (ref 400–2700)

## 2014-02-26 ENCOUNTER — Other Ambulatory Visit: Payer: Self-pay | Admitting: *Deleted

## 2014-02-26 DIAGNOSIS — B20 Human immunodeficiency virus [HIV] disease: Secondary | ICD-10-CM

## 2014-02-26 MED ORDER — ABACAVIR-DOLUTEGRAVIR-LAMIVUD 600-50-300 MG PO TABS: 1.0000 | ORAL_TABLET | Freq: Every day | ORAL | Status: DC

## 2014-04-29 ENCOUNTER — Ambulatory Visit (INDEPENDENT_AMBULATORY_CARE_PROVIDER_SITE_OTHER): Payer: 59 | Admitting: Internal Medicine

## 2014-04-29 ENCOUNTER — Encounter: Payer: Self-pay | Admitting: Internal Medicine

## 2014-04-29 VITALS — BP 108/66 | HR 71 | Temp 98.2°F | Ht 70.0 in | Wt 205.0 lb

## 2014-04-29 DIAGNOSIS — L239 Allergic contact dermatitis, unspecified cause: Secondary | ICD-10-CM

## 2014-04-29 DIAGNOSIS — L200 Besnier's prurigo: Secondary | ICD-10-CM

## 2014-04-29 DIAGNOSIS — B20 Human immunodeficiency virus [HIV] disease: Secondary | ICD-10-CM

## 2014-04-29 DIAGNOSIS — R5383 Other fatigue: Secondary | ICD-10-CM

## 2014-04-29 DIAGNOSIS — Z0000 Encounter for general adult medical examination without abnormal findings: Secondary | ICD-10-CM

## 2014-04-29 LAB — VITAMIN D 25 HYDROXY (VIT D DEFICIENCY, FRACTURES): VITD: 20.42 ng/mL — AB (ref 30.00–100.00)

## 2014-04-29 LAB — LIPID PANEL
CHOL/HDL RATIO: 4
CHOLESTEROL: 150 mg/dL (ref 0–200)
HDL: 35 mg/dL — AB (ref >39.00)
LDL CALC: 99 mg/dL (ref 0–99)
NonHDL: 115
TRIGLYCERIDES: 82 mg/dL (ref 0.0–149.0)
VLDL: 16.4 mg/dL (ref 0.0–40.0)

## 2014-04-29 LAB — COMPREHENSIVE METABOLIC PANEL
ALBUMIN: 3.8 g/dL (ref 3.5–5.2)
ALT: 39 U/L (ref 0–53)
AST: 36 U/L (ref 0–37)
Alkaline Phosphatase: 207 U/L — ABNORMAL HIGH (ref 39–117)
BUN: 13 mg/dL (ref 6–23)
CHLORIDE: 103 meq/L (ref 96–112)
CO2: 27 mEq/L (ref 19–32)
Calcium: 9 mg/dL (ref 8.4–10.5)
Creatinine, Ser: 1.2 mg/dL (ref 0.4–1.5)
GFR: 91.86 mL/min (ref >60.00)
Glucose, Bld: 107 mg/dL — ABNORMAL HIGH (ref 70–99)
POTASSIUM: 3.8 meq/L (ref 3.5–5.1)
Sodium: 135 mEq/L (ref 135–145)
TOTAL PROTEIN: 7.9 g/dL (ref 6.0–8.3)
Total Bilirubin: 0.5 mg/dL (ref 0.2–1.2)

## 2014-04-29 LAB — CBC
HCT: 38.3 % — ABNORMAL LOW (ref 39.0–52.0)
Hemoglobin: 12.5 g/dL — ABNORMAL LOW (ref 13.0–17.0)
MCHC: 32.7 g/dL (ref 30.0–36.0)
MCV: 93 fl (ref 78.0–100.0)
PLATELETS: 224 10*3/uL (ref 150.0–400.0)
RBC: 4.12 Mil/uL — ABNORMAL LOW (ref 4.22–5.81)
RDW: 13.7 % (ref 11.5–15.5)
WBC: 7.5 10*3/uL (ref 4.0–10.5)

## 2014-04-29 LAB — TSH: TSH: 2 u[IU]/mL (ref 0.35–4.50)

## 2014-04-29 MED ORDER — PREDNISONE 10 MG PO TABS: ORAL_TABLET | ORAL | Status: DC

## 2014-04-29 NOTE — Assessment & Plan Note (Signed)
Following with Dr. Johnnye Sima Viral load and CD4 counts reviewed

## 2014-04-29 NOTE — Progress Notes (Signed)
Pre visit review using our clinic review tool, if applicable. No additional management support is needed unless otherwise documented below in the visit note. 

## 2014-04-29 NOTE — Patient Instructions (Signed)

## 2014-04-29 NOTE — Progress Notes (Signed)
HPI  Pt presents to the clinic today establish care. She has not had a PCP in many years.   HIV: He is being followed by Dr. Johnnye Sima. His viral load was less than 20. His CD4 count was 410. He is taking the antiviral daily.  He does c/o a rash. He noticed this 2 weeks ago. The rash in noticed on his arm, legs and trunk. It does not itch. He has put a moisturizing lotion on it but it has not helped. He has not come in contact with anything that he is allergic to. He has not changed his diet. He has not started any new medications.  Additionally, he does c/o fatigue. He reports this started in the last month. He has felt like this in the past when his Vit D was low. He would like it rechecked today.  Past Medical History  Diagnosis Date  . HIV infection     Current Outpatient Prescriptions  Medication Sig Dispense Refill  . Abacavir-Dolutegravir-Lamivud 600-50-300 MG TABS Take 1 tablet by mouth daily. 30 tablet 11   No current facility-administered medications for this visit.    No Known Allergies  Family History  Problem Relation Age of Onset  . Hypertension Mother   . Alcohol abuse Father   . Hypertension Father   . Diabetes Maternal Grandmother   . Stroke Paternal Grandfather   . Cancer Neg Hx     History   Social History  . Marital Status: Single    Spouse Name: N/A    Number of Children: N/A  . Years of Education: N/A   Occupational History  . Not on file.   Social History Main Topics  . Smoking status: Never Smoker   . Smokeless tobacco: Never Used  . Alcohol Use: No  . Drug Use: No  . Sexual Activity: Yes     Comment: refused condoms   Other Topics Concern  . Not on file   Social History Narrative    ROS:  Constitutional: Pt reports fatigue. Denies fever, malaise, headache or abrupt weight changes.  HEENT: Denies eye pain, eye redness, ear pain, ringing in the ears, wax buildup, runny nose, nasal congestion, bloody nose, or sore throat. Respiratory:  Denies difficulty breathing, shortness of breath, cough or sputum production.   Cardiovascular: Denies chest pain, chest tightness, palpitations or swelling in the hands or feet.  Gastrointestinal: Denies abdominal pain, bloating, constipation, diarrhea or blood in the stool.  GU: Denies frequency, urgency, pain with urination, blood in urine, odor or discharge. Musculoskeletal: Denies decrease in range of motion, difficulty with gait, muscle pain or joint pain and swelling.  Skin: Pt reports rash. Denies  lesions or ulcercations.  Neurological: Denies dizziness, difficulty with memory, difficulty with speech or problems with balance and coordination.  Psych: Pt denies anxiety, depression, SI/HI.  No other specific complaints in a complete review of systems (except as listed in HPI above).  PE:  BP 108/66 mmHg  Pulse 71  Temp(Src) 98.2 F (36.8 C) (Oral)  Ht 5' 10"  (1.778 m)  Wt 205 lb (92.987 kg)  BMI 29.41 kg/m2  SpO2 98% Wt Readings from Last 3 Encounters:  04/29/14 205 lb (92.987 kg)  10/30/13 206 lb (93.441 kg)  05/08/13 197 lb (89.359 kg)    General: Appears his stated age, overweight but in NAD. Skin: Scattered convalescent erythematous blanchable rash noted on trunk, back and bilateral arms. HEENT: Head: normal shape and size; Eyes: sclera white, no icterus, conjunctiva pink, PERRLA  and EOMs intact; Ears: Tm's gray and intact, normal light reflex; Nose: mucosa pink and moist, septum midline; Throat/Mouth: Teeth present, mucosa pink and moist, no lesions or ulcerations noted.  Neck: Neck supple, trachea midline. No masses, lumps or thyromegaly present.  Cardiovascular: Normal rate and rhythm. S1,S2 noted.  No murmur, rubs or gallops noted. No JVD or BLE edema. No carotid bruits noted. Pulmonary/Chest: Normal effort and positive vesicular breath sounds. No respiratory distress. No wheezes, rales or ronchi noted.  Abdomen: Soft and nontender. Normal bowel sounds, no bruits  noted. No distention or masses noted. Liver, spleen and kidneys non palpable. Musculoskeletal: Normal range of motion. Strength 5/5 BUE/BLE. No difficulty with gait.  Neurological: Alert and oriented. Cranial nerves II-XII grossly intact. Coordination normal.  Psychiatric: Mood and affect normal. Behavior is normal. Judgment and thought content normal.    BMET    Component Value Date/Time   NA 138 04/03/2013 0918   K 3.9 04/03/2013 0918   CL 104 04/03/2013 0918   CO2 27 04/03/2013 0918   GLUCOSE 94 04/03/2013 0918   BUN 12 04/03/2013 0918   CREATININE 1.12 04/03/2013 0918   CREATININE 0.99 06/29/2010 1837   CALCIUM 9.1 04/03/2013 0918   GFRNONAA 88 04/03/2013 0918   GFRAA >89 04/03/2013 0918    Lipid Panel     Component Value Date/Time   CHOL 138 04/24/2013 1030   TRIG 57 04/24/2013 1030   HDL 42 04/24/2013 1030   CHOLHDL 3.3 04/24/2013 1030   VLDL 11 04/24/2013 1030   LDLCALC 85 04/24/2013 1030    CBC    Component Value Date/Time   WBC 9.0 10/30/2013 0912   RBC 4.22 10/30/2013 0912   HGB 13.0 10/30/2013 0912   HCT 38.9* 10/30/2013 0912   PLT 167 10/30/2013 0912   MCV 92.2 10/30/2013 0912   MCH 30.8 10/30/2013 0912   MCHC 33.4 10/30/2013 0912   RDW 13.1 10/30/2013 0912   LYMPHSABS 3.3 04/03/2013 0918   MONOABS 0.7 04/03/2013 0918   EOSABS 0.1 04/03/2013 0918   BASOSABS 0.0 04/03/2013 0918    Hgb A1C No results found for: HGBA1C   Assessment and Plan:  Preventative Health Maintenance:  He is unsure of last Tdap but declines booster today Will check CBC, CMET, Lipid today Encouraged him to work on diet and exercise  Fatigue:  Will check TSH and Vit D today  Allergic Dermatitis:  eRx for pred taper x 6 days Benadryl as needed for itching  Will follow up with you after your labs are back, RTC as needed

## 2014-04-30 ENCOUNTER — Other Ambulatory Visit: Payer: Self-pay | Admitting: Internal Medicine

## 2014-04-30 MED ORDER — VITAMIN D (ERGOCALCIFEROL) 1.25 MG (50000 UNIT) PO CAPS: 50000.0000 [IU] | ORAL_CAPSULE | ORAL | Status: DC

## 2014-06-26 ENCOUNTER — Other Ambulatory Visit: Payer: Self-pay | Admitting: *Deleted

## 2014-06-26 DIAGNOSIS — Z113 Encounter for screening for infections with a predominantly sexual mode of transmission: Secondary | ICD-10-CM

## 2014-07-22 ENCOUNTER — Ambulatory Visit (INDEPENDENT_AMBULATORY_CARE_PROVIDER_SITE_OTHER): Payer: 59 | Admitting: Internal Medicine

## 2014-07-22 ENCOUNTER — Encounter: Payer: Self-pay | Admitting: Internal Medicine

## 2014-07-22 VITALS — BP 116/68 | HR 85 | Temp 98.4°F | Wt 217.5 lb

## 2014-07-22 DIAGNOSIS — R631 Polydipsia: Secondary | ICD-10-CM

## 2014-07-22 DIAGNOSIS — E559 Vitamin D deficiency, unspecified: Secondary | ICD-10-CM | POA: Diagnosis not present

## 2014-07-22 DIAGNOSIS — Z833 Family history of diabetes mellitus: Secondary | ICD-10-CM

## 2014-07-22 DIAGNOSIS — H538 Other visual disturbances: Secondary | ICD-10-CM

## 2014-07-22 DIAGNOSIS — R358 Other polyuria: Secondary | ICD-10-CM

## 2014-07-22 DIAGNOSIS — R3589 Other polyuria: Secondary | ICD-10-CM

## 2014-07-22 LAB — VITAMIN D 25 HYDROXY (VIT D DEFICIENCY, FRACTURES): VITD: 37.87 ng/mL (ref 30.00–100.00)

## 2014-07-22 LAB — HEMOGLOBIN A1C: Hgb A1c MFr Bld: 6.1 % (ref 4.6–6.5)

## 2014-07-22 NOTE — Progress Notes (Signed)
Pre visit review using our clinic review tool, if applicable. No additional management support is needed unless otherwise documented below in the visit note. 

## 2014-07-22 NOTE — Progress Notes (Signed)
Subjective:    Patient ID: Connor [name], male    DOB: 05/13/1985, 29 y.o.   MRN: [id number]  HPI  Pt presents to the clinic today to recheck Vit D levels. His Vit D level was 20.42 04/2014. He was started on ergocalciferol 50000 units weekly x 12 weeks. He took the medication as prescribed. He denies any adverse effects. He does need his levels rechecked today. He is not taking any supplemental Vit D OTC.  He also wants to be checked for diabetes. He has a strong family history of it. He is having some blurred vision in his left eye. This is constant. He saw his eye doctor 04/2014 and was told he had an astigmatism in his left eye. He was given prescription glasses but reports they have not helped. He also feels like he has increased thirst and urination. He denies frequent infections, non healing wounds or numbness and tingling in his hands and feet.  Review of Systems  Past Medical History  Diagnosis Date  . HIV infection     Current Outpatient Prescriptions  Medication Sig Dispense Refill  . Abacavir-Dolutegravir-Lamivud 600-50-300 MG TABS Take 1 tablet by mouth daily. 30 tablet 11  . predniSONE (DELTASONE) 10 MG tablet Take 3 tabs on days 1-2, take 2 tabs on days 3-4, take 1 tab on days 5-6 12 tablet 0  . Vitamin D, Ergocalciferol, (DRISDOL) 50000 UNITS CAPS capsule Take 1 capsule (50,000 Units total) by mouth every 7 (seven) days. 12 capsule 0   No current facility-administered medications for this visit.    No Known Allergies  Family History  Problem Relation Age of Onset  . Hypertension Mother   . Alcohol abuse Father   . Hypertension Father   . Diabetes Maternal Grandmother   . Stroke Paternal Grandfather   . Cancer Neg Hx     History   Social History  . Marital Status: Single    Spouse Name: N/A  . Number of Children: N/A  . Years of Education: N/A   Occupational History  . Not on file.   Social History Main Topics  . Smoking status: Never Smoker   .  Smokeless tobacco: Never Used  . Alcohol Use: No  . Drug Use: No  . Sexual Activity: Yes     Comment: refused condoms   Other Topics Concern  . Not on file   Social History Narrative     Constitutional: Denies fever, malaise, fatigue, headache or abrupt weight changes.  HEENT: Denies eye pain, eye redness, ear pain, ringing in the ears, wax buildup, runny nose, nasal congestion, bloody nose, or sore throat. Respiratory: Denies difficulty breathing, shortness of breath, cough or sputum production.   Cardiovascular: Denies chest pain, chest tightness, palpitations or swelling in the hands or feet.  Gastrointestinal: Denies abdominal pain, bloating, constipation, diarrhea or blood in the stool.  GU: Denies urgency, frequency, pain with urination, burning sensation, blood in urine, odor or discharge. Musculoskeletal: Denies decrease in range of motion, difficulty with gait, muscle pain or joint pain and swelling.  Skin: Denies redness, rashes, lesions or ulcercations.  Neurological: Denies dizziness, difficulty with memory, difficulty with speech or problems with balance and coordination.   No other specific complaints in a complete review of systems (except as listed in HPI above).     Objective:   Physical Exam  BP 116/68 mmHg  Pulse 85  Temp(Src) 98.4 F (36.9 C) (Oral)  Wt 217 lb 8 oz (98.657 kg)  SpO2 99% Wt Readings from Last 3 Encounters:  07/22/14 217 lb 8 oz (98.657 kg)  04/29/14 205 lb (92.987 kg)  10/30/13 206 lb (93.441 kg)    General: Appears his stated age, well developed, well nourished in NAD. Skin: Warm, dry and intact. No rashes, lesions or ulcerations noted. HEENT: Head: normal shape and size; Eyes: sclera white, no icterus, conjunctiva pink, PERRLA and EOMs intact;  Cardiovascular: Normal rate and rhythm. S1,S2 noted.  No murmur, rubs or gallops noted.  Pulmonary/Chest: Normal effort and positive vesicular breath sounds. No respiratory distress. No  wheezes, rales or ronchi noted.  Abdomen: Soft and nontender. Normal bowel sounds, no bruits noted. No distention or masses noted. Liver, spleen and kidneys non palpable. Neurological: Alert and oriented.    BMET    Component Value Date/Time   NA 135 04/29/2014 1125   K 3.8 04/29/2014 1125   CL 103 04/29/2014 1125   CO2 27 04/29/2014 1125   GLUCOSE 107* 04/29/2014 1125   BUN 13 04/29/2014 1125   CREATININE 1.2 04/29/2014 1125   CREATININE 1.12 04/03/2013 0918   CALCIUM 9.0 04/29/2014 1125   GFRNONAA 88 04/03/2013 0918   GFRAA >89 04/03/2013 0918    Lipid Panel     Component Value Date/Time   CHOL 150 04/29/2014 1125   TRIG 82.0 04/29/2014 1125   HDL 35.00* 04/29/2014 1125   CHOLHDL 4 04/29/2014 1125   VLDL 16.4 04/29/2014 1125   LDLCALC 99 04/29/2014 1125    CBC    Component Value Date/Time   WBC 7.5 04/29/2014 1125   RBC 4.12* 04/29/2014 1125   HGB 12.5* 04/29/2014 1125   HCT 38.3* 04/29/2014 1125   PLT 224.0 04/29/2014 1125   MCV 93.0 04/29/2014 1125   MCH 30.8 10/30/2013 0912   MCHC 32.7 04/29/2014 1125   RDW 13.7 04/29/2014 1125   LYMPHSABS 3.3 04/03/2013 0918   MONOABS 0.7 04/03/2013 0918   EOSABS 0.1 04/03/2013 0918   BASOSABS 0.0 04/03/2013 0918    Hgb A1C No results found for: HGBA1C       Assessment & Plan:   Vit D deficiency:  Will repeat Vit D level today If normal, start OTC Vit D3 supplement If low, will repeat ergocalciferol 50000 units weekly x 12 weeks  Family history of DM, blurred vision in left eye, polyuria and polydipsia:  Will check A1C today If A1C normal, will advise him to return to see his opthalmologist about blurred vision.  RTC in 9 months for your annual exam, sooner if needed

## 2014-07-22 NOTE — Patient Instructions (Signed)
Vitamin D Deficiency Vitamin D is an important vitamin that your body needs. Having too little of it in your body is called a deficiency. A very bad deficiency can make your bones soft and can cause a condition called rickets.  Vitamin D is important to your body for different reasons, such as:   It helps your body absorb 2 minerals called calcium and phosphorus.  It helps make your bones healthy.  It may prevent some diseases, such as diabetes and multiple sclerosis.  It helps your muscles and heart. You can get vitamin D in several ways. It is a natural part of some foods. The vitamin is also added to some dairy products and cereals. Some people take vitamin D supplements. Also, your body makes vitamin D when you are in the sun. It changes the sun's rays into a form of the vitamin that your body can use. CAUSES   Not eating enough foods that contain vitamin D.  Not getting enough sunlight.  Having certain digestive system diseases that make it hard to absorb vitamin D. These diseases include Crohn's disease, chronic pancreatitis, and cystic fibrosis.  Having a surgery in which part of the stomach or small intestine is removed.  Being obese. Fat cells pull vitamin D out of your blood. That means that obese people may not have enough vitamin D left in their blood and in other body tissues.  Having chronic kidney or liver disease. RISK FACTORS Risk factors are things that make you more likely to develop a vitamin D deficiency. They include:  Being older.  Not being able to get outside very much.  Living in a nursing home.  Having had broken bones.  Having weak or thin bones (osteoporosis).  Having a disease or condition that changes how your body absorbs vitamin D.  Having dark skin.  Some medicines such as seizure medicines or steroids.  Being overweight or obese. SYMPTOMS Mild cases of vitamin D deficiency may not have any symptoms. If you have a very bad case, symptoms  may include:  Bone pain.  Muscle pain.  Falling often.  Broken bones caused by a minor injury, due to osteoporosis. DIAGNOSIS A blood test is the best way to tell if you have a vitamin D deficiency. TREATMENT Vitamin D deficiency can be treated in different ways. Treatment for vitamin D deficiency depends on what is causing it. Options include:  Taking vitamin D supplements.  Taking a calcium supplement. Your caregiver will suggest what dose is best for you. HOME CARE INSTRUCTIONS  Take any supplements that your caregiver prescribes. Follow the directions carefully. Take only the suggested amount.  Have your blood tested 2 months after you start taking supplements.  Eat foods that contain vitamin D. Healthy choices include:  Fortified dairy products, cereals, or juices. Fortified means vitamin D has been added to the food. Check the label on the package to be sure.  Fatty fish like salmon or trout.  Eggs.  Oysters.  Do not use a tanning bed.  Keep your weight at a healthy level. Lose weight if you need to.  Keep all follow-up appointments. Your caregiver will need to perform blood tests to make sure your vitamin D deficiency is going away. SEEK MEDICAL CARE IF:  You have any questions about your treatment.  You continue to have symptoms of vitamin D deficiency.  You have nausea or vomiting.  You are constipated.  You feel confused.  You have severe abdominal or back pain. MAKE  SURE YOU:  Understand these instructions.  Will watch your condition.  Will get help right away if you are not doing well or get worse. Document Released: 06/27/2011 Document Revised: 07/30/2012 Document Reviewed: 06/27/2011 [facility] Patient Information 2015 [place], [place]. This information is not intended to replace advice given to you by your health care provider. Make sure you discuss any questions you have with your health care provider.

## 2014-09-29 ENCOUNTER — Other Ambulatory Visit: Payer: 59

## 2014-09-29 DIAGNOSIS — Z113 Encounter for screening for infections with a predominantly sexual mode of transmission: Secondary | ICD-10-CM

## 2014-09-29 DIAGNOSIS — B20 Human immunodeficiency virus [HIV] disease: Secondary | ICD-10-CM

## 2014-09-30 ENCOUNTER — Telehealth: Payer: Self-pay | Admitting: *Deleted

## 2014-09-30 LAB — COMPLETE METABOLIC PANEL WITH GFR
ALT: 9 U/L (ref 0–53)
AST: 16 U/L (ref 0–37)
Albumin: 4.2 g/dL (ref 3.5–5.2)
Alkaline Phosphatase: 87 U/L (ref 39–117)
BUN: 15 mg/dL (ref 6–23)
CO2: 28 meq/L (ref 19–32)
Calcium: 9.6 mg/dL (ref 8.4–10.5)
Chloride: 104 mEq/L (ref 96–112)
Creat: 1 mg/dL (ref 0.50–1.35)
GFR, Est Non African American: >89 mL/min
GLUCOSE: 86 mg/dL (ref 70–99)
Potassium: 4.1 mEq/L (ref 3.5–5.3)
SODIUM: 138 meq/L (ref 135–145)
TOTAL PROTEIN: 7.6 g/dL (ref 6.0–8.3)
Total Bilirubin: 0.4 mg/dL (ref 0.2–1.2)

## 2014-09-30 LAB — CBC WITH DIFFERENTIAL/PLATELET
Basophils Absolute: 0 10*3/uL (ref 0.0–0.1)
Basophils Relative: 0 % (ref 0–1)
Eosinophils Absolute: 0.2 10*3/uL (ref 0.0–0.7)
Eosinophils Relative: 3 % (ref 0–5)
HEMATOCRIT: 38.2 % — AB (ref 39.0–52.0)
Hemoglobin: 12.6 g/dL — ABNORMAL LOW (ref 13.0–17.0)
LYMPHS ABS: 2.6 10*3/uL (ref 0.7–4.0)
LYMPHS PCT: 34 % (ref 12–46)
MCH: 30.9 pg (ref 26.0–34.0)
MCHC: 33 g/dL (ref 30.0–36.0)
MCV: 93.6 fL (ref 78.0–100.0)
MPV: 11 fL (ref 8.6–12.4)
Monocytes Absolute: 0.8 10*3/uL (ref 0.1–1.0)
Monocytes Relative: 11 % (ref 3–12)
NEUTROS ABS: 4 10*3/uL (ref 1.7–7.7)
Neutrophils Relative %: 52 % (ref 43–77)
PLATELETS: 219 10*3/uL (ref 150–400)
RBC: 4.08 MIL/uL — ABNORMAL LOW (ref 4.22–5.81)
RDW: 13.5 % (ref 11.5–15.5)
WBC: 7.6 10*3/uL (ref 4.0–10.5)

## 2014-09-30 LAB — T-HELPER CELL (CD4) - (RCID CLINIC ONLY)
CD4 % Helper T Cell: 16 % — ABNORMAL LOW (ref 33–55)
CD4 T Cell Abs: 420 /uL (ref 400–2700)

## 2014-09-30 LAB — RPR TITER: RPR Titer: 1:1024 {titer} — AB

## 2014-09-30 LAB — HIV-1 RNA QUANT-NO REFLEX-BLD

## 2014-09-30 LAB — RPR: RPR Ser Ql: REACTIVE — AB

## 2014-09-30 LAB — FLUORESCENT TREPONEMAL AB(FTA)-IGG-BLD: FLUORESCENT TREPONEMAL ABS: REACTIVE — AB

## 2014-09-30 NOTE — Telephone Encounter (Signed)
Will contact patient. Please advise how many injections of Bicillin 2.4 IM? Landis Gandy, RN

## 2014-09-30 NOTE — Telephone Encounter (Signed)
-----   Message from Campbell Riches, MD sent at 09/30/2014  1:51 PM EDT ----- Has pt gotten PEN G yet for primary syphillis?

## 2014-10-01 NOTE — Telephone Encounter (Signed)
Per Dr. Johnnye Sima, Bicillin 2.4 IM x one. Myrtis Hopping

## 2014-10-01 NOTE — Telephone Encounter (Signed)
Pt called to schedule appt for injection.  Scheduled pt for Fri., June 17 @ 0900.

## 2014-10-01 NOTE — Telephone Encounter (Signed)
Left message with patient asking him to call back and schedule 1 appointment for treatment due to recent lab results.

## 2014-10-01 NOTE — Progress Notes (Signed)
Per Dr. Johnnye Sima 2.4 million Bicillin x one.

## 2014-10-02 NOTE — Progress Notes (Signed)
Patient scheduled for 10/03/14

## 2014-10-03 ENCOUNTER — Ambulatory Visit (INDEPENDENT_AMBULATORY_CARE_PROVIDER_SITE_OTHER): Payer: 59 | Admitting: *Deleted

## 2014-10-03 DIAGNOSIS — A539 Syphilis, unspecified: Secondary | ICD-10-CM | POA: Diagnosis not present

## 2014-10-03 MED ORDER — PENICILLIN G BENZATHINE 1200000 UNIT/2ML IM SUSP
1.2000 10*6.[IU] | Freq: Once | INTRAMUSCULAR | Status: AC
  Administered 2014-10-03: 1.2 10*6.[IU] via INTRAMUSCULAR

## 2014-10-08 ENCOUNTER — Telehealth: Payer: Self-pay | Admitting: *Deleted

## 2014-10-08 NOTE — Telephone Encounter (Signed)
Spoke with patient, confirmed appointments for the next 2 Thursdays instead of Fridays. Patient unable to come in on Fridays due to work schedule. Left message with this information on his voicemail at his request.

## 2014-10-08 NOTE — Telephone Encounter (Signed)
Probably should get 2 more to weekly IM doses.  Let the HD know as well when that is done for their reporting purposes.  thanks

## 2014-10-08 NOTE — Telephone Encounter (Signed)
Connor [name], [facility] in office to follow up on most recent RPR. Patient received bicillin 2.4 million units x 1.  Health Department is inquiring if patient should be treated for latent syphilis due to high titer (1:1024) and last negative RPR >12 months ago (most recent was 18 months ago). Please advise. He can be reached at [phone]. Landis Gandy, RN

## 2014-10-08 NOTE — Telephone Encounter (Signed)
Left message asking the patient to come in the next 2 Fridays to complete treatment.  Asked him to call back and schedule/confirm.

## 2014-10-09 ENCOUNTER — Ambulatory Visit (INDEPENDENT_AMBULATORY_CARE_PROVIDER_SITE_OTHER): Payer: 59 | Admitting: Licensed Clinical Social Worker

## 2014-10-09 DIAGNOSIS — A539 Syphilis, unspecified: Secondary | ICD-10-CM | POA: Diagnosis not present

## 2014-10-09 MED ORDER — PENICILLIN G BENZATHINE 1200000 UNIT/2ML IM SUSP
1.2000 10*6.[IU] | Freq: Once | INTRAMUSCULAR | Status: AC
  Administered 2014-10-09: 1.2 10*6.[IU] via INTRAMUSCULAR

## 2014-10-16 ENCOUNTER — Ambulatory Visit: Payer: 59

## 2014-10-17 ENCOUNTER — Ambulatory Visit (INDEPENDENT_AMBULATORY_CARE_PROVIDER_SITE_OTHER): Payer: 59 | Admitting: *Deleted

## 2014-10-17 DIAGNOSIS — A539 Syphilis, unspecified: Secondary | ICD-10-CM | POA: Diagnosis not present

## 2014-10-17 MED ORDER — PENICILLIN G BENZATHINE 1200000 UNIT/2ML IM SUSP
1.2000 10*6.[IU] | Freq: Once | INTRAMUSCULAR | Status: AC
Start: 2014-10-17 — End: 2014-10-17
  Administered 2014-10-17: 1.2 10*6.[IU] via INTRAMUSCULAR

## 2014-10-17 MED ORDER — PENICILLIN G BENZATHINE 1200000 UNIT/2ML IM SUSP
1.2000 10*6.[IU] | Freq: Once | INTRAMUSCULAR | Status: AC
  Administered 2014-10-17: 1.2 10*6.[IU] via INTRAMUSCULAR

## 2014-10-17 NOTE — Patient Instructions (Signed)
Pt given 2 bags of condoms and instructed to always use them.

## 2014-11-03 ENCOUNTER — Ambulatory Visit (INDEPENDENT_AMBULATORY_CARE_PROVIDER_SITE_OTHER): Payer: 59 | Admitting: Infectious Diseases

## 2014-11-03 ENCOUNTER — Encounter: Payer: Self-pay | Admitting: Infectious Diseases

## 2014-11-03 VITALS — BP 124/72 | HR 72 | Temp 97.6°F | Wt 211.0 lb

## 2014-11-03 DIAGNOSIS — G4700 Insomnia, unspecified: Secondary | ICD-10-CM | POA: Diagnosis not present

## 2014-11-03 DIAGNOSIS — Z79899 Other long term (current) drug therapy: Secondary | ICD-10-CM

## 2014-11-03 DIAGNOSIS — B20 Human immunodeficiency virus [HIV] disease: Secondary | ICD-10-CM | POA: Diagnosis not present

## 2014-11-03 DIAGNOSIS — Z113 Encounter for screening for infections with a predominantly sexual mode of transmission: Secondary | ICD-10-CM | POA: Diagnosis not present

## 2014-11-03 NOTE — Assessment & Plan Note (Signed)
Doing well, appears to be resolved

## 2014-11-03 NOTE — Progress Notes (Signed)
   Subjective:    Patient ID: Connor [name], male    DOB: December 10, 1985, 29 y.o.   MRN: [id number]  HPI 29 yo M with hx of HIV/AIDS, was having difficulty with his ART previously ( headaches, trouble sleeping). Was changed to complera April 2013, then to triumeq January 2015 (due to food issues). Has received Hep B series x 2.   No problems with ART.  Sleeping well.  Has been gaining wt, not exercising. Not watching diet.  Got syphilis inj last month  HIV 1 RNA QUANT (copies/mL)  Date Value  09/29/2014 <20  10/30/2013 <20  04/03/2013 <20   CD4 T CELL ABS (/uL)  Date Value  09/29/2014 420  10/30/2013 410  04/03/2013 340*    Review of Systems  Constitutional: Negative for appetite change and unexpected weight change.  Gastrointestinal: Negative for diarrhea and constipation.  Genitourinary: Negative for difficulty urinating.       Objective:   Physical Exam  Constitutional: He appears well-developed and well-nourished.  HENT:  Mouth/Throat: No oropharyngeal exudate.  Eyes: EOM are normal. Pupils are equal, round, and reactive to light.  Neck: Neck supple.  Cardiovascular: Normal rate, regular rhythm and normal heart sounds.   Pulmonary/Chest: Effort normal and breath sounds normal.  Abdominal: Soft. Bowel sounds are normal. There is no tenderness. There is no rebound.  Lymphadenopathy:    He has no cervical adenopathy.       Assessment & Plan:

## 2014-11-03 NOTE — Assessment & Plan Note (Signed)
He is doing very well.  Has some greenish sputum- suggested he try OTC allergy rx.  Offered/refused condoms.  rtc 6-12 months.

## 2015-02-16 ENCOUNTER — Encounter: Payer: Self-pay | Admitting: Internal Medicine

## 2015-03-17 ENCOUNTER — Other Ambulatory Visit: Payer: Self-pay | Admitting: Infectious Diseases

## 2015-03-17 DIAGNOSIS — B20 Human immunodeficiency virus [HIV] disease: Secondary | ICD-10-CM

## 2015-03-18 ENCOUNTER — Encounter: Payer: Self-pay | Admitting: Internal Medicine

## 2015-03-18 DIAGNOSIS — E559 Vitamin D deficiency, unspecified: Secondary | ICD-10-CM

## 2015-03-19 ENCOUNTER — Other Ambulatory Visit (INDEPENDENT_AMBULATORY_CARE_PROVIDER_SITE_OTHER): Payer: 59

## 2015-03-19 DIAGNOSIS — E559 Vitamin D deficiency, unspecified: Secondary | ICD-10-CM | POA: Diagnosis not present

## 2015-03-19 LAB — VITAMIN D 25 HYDROXY (VIT D DEFICIENCY, FRACTURES): VITD: 34.36 ng/mL (ref 30.00–100.00)

## 2015-04-22 ENCOUNTER — Other Ambulatory Visit: Payer: 59

## 2015-04-23 MED FILL — TRIUMEQ 600-50-300 MG TABS: 600-50-300 | 30 days supply | Qty: 30 | Fill #1

## 2015-04-28 ENCOUNTER — Encounter: Payer: 59 | Admitting: Internal Medicine

## 2015-05-05 ENCOUNTER — Ambulatory Visit (INDEPENDENT_AMBULATORY_CARE_PROVIDER_SITE_OTHER): Payer: 59 | Admitting: Internal Medicine

## 2015-05-05 ENCOUNTER — Encounter: Payer: Self-pay | Admitting: Internal Medicine

## 2015-05-05 VITALS — BP 120/74 | HR 78 | Temp 97.7°F | Ht 70.25 in | Wt 214.0 lb

## 2015-05-05 DIAGNOSIS — Z0000 Encounter for general adult medical examination without abnormal findings: Secondary | ICD-10-CM

## 2015-05-05 DIAGNOSIS — Z23 Encounter for immunization: Secondary | ICD-10-CM

## 2015-05-05 DIAGNOSIS — B20 Human immunodeficiency virus [HIV] disease: Secondary | ICD-10-CM | POA: Diagnosis not present

## 2015-05-05 LAB — COMPREHENSIVE METABOLIC PANEL
ALT: 10 U/L (ref 0–53)
AST: 16 U/L (ref 0–37)
Albumin: 4.2 g/dL (ref 3.5–5.2)
Alkaline Phosphatase: 84 U/L (ref 39–117)
BILIRUBIN TOTAL: 0.3 mg/dL (ref 0.2–1.2)
BUN: 16 mg/dL (ref 6–23)
CALCIUM: 9.4 mg/dL (ref 8.4–10.5)
CO2: 29 meq/L (ref 19–32)
CREATININE: 1.13 mg/dL (ref 0.40–1.50)
Chloride: 106 mEq/L (ref 96–112)
GFR: 96.87 mL/min (ref >60.00)
Glucose, Bld: 108 mg/dL — ABNORMAL HIGH (ref 70–99)
Potassium: 4 mEq/L (ref 3.5–5.1)
Sodium: 141 mEq/L (ref 135–145)
Total Protein: 7.7 g/dL (ref 6.0–8.3)

## 2015-05-05 LAB — CBC
HCT: 40.6 % (ref 39.0–52.0)
Hemoglobin: 13.3 g/dL (ref 13.0–17.0)
MCHC: 32.8 g/dL (ref 30.0–36.0)
MCV: 92.3 fl (ref 78.0–100.0)
PLATELETS: 167 10*3/uL (ref 150.0–400.0)
RBC: 4.4 Mil/uL (ref 4.22–5.81)
RDW: 13.6 % (ref 11.5–15.5)
WBC: 8 10*3/uL (ref 4.0–10.5)

## 2015-05-05 LAB — LIPID PANEL
CHOL/HDL RATIO: 4
Cholesterol: 132 mg/dL (ref 0–200)
HDL: 35.2 mg/dL — ABNORMAL LOW (ref >39.00)
LDL Cholesterol: 86 mg/dL (ref 0–99)
NonHDL: 96.9
Triglycerides: 57 mg/dL (ref 0.0–149.0)
VLDL: 11.4 mg/dL (ref 0.0–40.0)

## 2015-05-05 NOTE — Progress Notes (Signed)
Pre visit review using our clinic review tool, if applicable. No additional management support is needed unless otherwise documented below in the visit note. 

## 2015-05-05 NOTE — Patient Instructions (Signed)

## 2015-05-05 NOTE — Assessment & Plan Note (Signed)
He will follow up with Dr. Johnnye Sima this month Will check CD4, HIV RNA and RPR

## 2015-05-05 NOTE — Progress Notes (Signed)
HPI  Pt presents to the clinic today for his annual exam.  HIV: He is being followed by Dr. Johnnye Sima. His viral load was less than 20. His CD4 count was 420. He is taking the antiviral daily.  Flu: 01/2015, at work Tetanus:  unsure Pneumovax: 2011 Dentist: biannually  Diet: He does eat meat. He consumes fruits and veggies daily. He does eat some fried foods. He drinks mostly water, some soda. Exercise: None  Past Medical History  Diagnosis Date  . HIV infection     Current Outpatient Prescriptions  Medication Sig Dispense Refill  . TRIUMEQ 600-50-300 MG TABS TAKE 1 TABLET BY MOUTH DAILY. 30 tablet 5   No current facility-administered medications for this visit.    No Known Allergies  Family History  Problem Relation Age of Onset  . Hypertension Mother   . Alcohol abuse Father   . Hypertension Father   . Diabetes Maternal Grandmother   . Stroke Paternal Grandfather   . Cancer Neg Hx     Social History   Social History  . Marital Status: Single    Spouse Name: N/A  . Number of Children: N/A  . Years of Education: N/A   Occupational History  . Not on file.   Social History Main Topics  . Smoking status: Never Smoker   . Smokeless tobacco: Never Used  . Alcohol Use: No  . Drug Use: No  . Sexual Activity: Yes     Comment: refused condoms   Other Topics Concern  . Not on file   Social History Narrative    ROS:  Constitutional: Pt reports fatigue. Denies fever, malaise, headache or abrupt weight changes.  HEENT: Denies eye pain, eye redness, ear pain, ringing in the ears, wax buildup, runny nose, nasal congestion, bloody nose, or sore throat. Respiratory: Denies difficulty breathing, shortness of breath, cough or sputum production.   Cardiovascular: Denies chest pain, chest tightness, palpitations or swelling in the hands or feet.  Gastrointestinal: Denies abdominal pain, bloating, constipation, diarrhea or blood in the stool.  GU: Denies frequency,  urgency, pain with urination, blood in urine, odor or discharge. Musculoskeletal: Denies decrease in range of motion, difficulty with gait, muscle pain or joint pain and swelling.  Skin: Pt reports rash. Denies  lesions or ulcercations.  Neurological: Denies dizziness, difficulty with memory, difficulty with speech or problems with balance and coordination.  Psych: Pt denies anxiety, depression, SI/HI.  No other specific complaints in a complete review of systems (except as listed in HPI above).  PE:  BP 120/74 mmHg  Pulse 78  Temp(Src) 97.7 F (36.5 C) (Oral)  Ht 5' 10.25" (1.784 m)  Wt 214 lb (97.07 kg)  BMI 30.50 kg/m2  SpO2 98%  Wt Readings from Last 3 Encounters:  11/03/14 211 lb (95.709 kg)  07/22/14 217 lb 8 oz (98.657 kg)  04/29/14 205 lb (92.987 kg)    General: Appears his stated age, overweight but in NAD. Skin: Warm, dry and intact. HEENT: Head: normal shape and size; Eyes: sclera white, no icterus, conjunctiva pink, PERRLA and EOMs intact; Ears: Tm's gray and intact, normal light reflex; Throat/Mouth: Teeth present, mucosa pink and moist, no lesions or ulcerations noted.  Neck: Neck supple, trachea midline. No masses, lumps or thyromegaly present.  Cardiovascular: Normal rate and rhythm. S1,S2 noted.  No murmur, rubs or gallops noted.  Pulmonary/Chest: Normal effort and positive vesicular breath sounds. No respiratory distress. No wheezes, rales or ronchi noted.  Abdomen: Soft and nontender. Normal  bowel sounds. No distention or masses noted. Liver, spleen and kidneys non palpable. Musculoskeletal: Normal range of motion. Strength 5/5 BUE/BLE. No difficulty with gait.  Neurological: Alert and oriented. Cranial nerves II-XII grossly intact. Coordination normal.  Psychiatric: Mood and affect normal. Behavior is normal. Judgment and thought content normal.    BMET    Component Value Date/Time   NA 138 09/29/2014 1407   K 4.1 09/29/2014 1407   CL 104 09/29/2014 1407    CO2 28 09/29/2014 1407   GLUCOSE 86 09/29/2014 1407   BUN 15 09/29/2014 1407   CREATININE 1.00 09/29/2014 1407   CREATININE 1.2 04/29/2014 1125   CALCIUM 9.6 09/29/2014 1407   GFRNONAA >89 09/29/2014 1407   GFRAA >89 09/29/2014 1407    Lipid Panel     Component Value Date/Time   CHOL 150 04/29/2014 1125   TRIG 82.0 04/29/2014 1125   HDL 35.00* 04/29/2014 1125   CHOLHDL 4 04/29/2014 1125   VLDL 16.4 04/29/2014 1125   LDLCALC 99 04/29/2014 1125    CBC    Component Value Date/Time   WBC 7.6 09/29/2014 1407   RBC 4.08* 09/29/2014 1407   HGB 12.6* 09/29/2014 1407   HCT 38.2* 09/29/2014 1407   PLT 219 09/29/2014 1407   MCV 93.6 09/29/2014 1407   MCH 30.9 09/29/2014 1407   MCHC 33.0 09/29/2014 1407   RDW 13.5 09/29/2014 1407   LYMPHSABS 2.6 09/29/2014 1407   MONOABS 0.8 09/29/2014 1407   EOSABS 0.2 09/29/2014 1407   BASOSABS 0.0 09/29/2014 1407    Hgb A1C Lab Results  Component Value Date   HGBA1C 6.1 07/22/2014     Assessment and Plan:  Preventative Health Maintenance:  Flu and Pneumovax UTD Tdap today Will check CBC, CMET, Lipid today Encouraged him to consume a healthy diet and start an exercise regimen Encouraged him to continue to see a dentist yearly   RTC in 1 year or sooner if needed

## 2015-05-06 LAB — HIV-1 RNA QUANT-NO REFLEX-BLD
HIV 1 RNA Quant: <20 copies/mL (ref <20)
HIV-1 RNA Quant, Log: <1.3 Log copies/mL (ref <1.30)

## 2015-05-06 LAB — RPR

## 2015-05-07 ENCOUNTER — Other Ambulatory Visit (INDEPENDENT_AMBULATORY_CARE_PROVIDER_SITE_OTHER): Payer: 59

## 2015-05-07 DIAGNOSIS — R7309 Other abnormal glucose: Secondary | ICD-10-CM | POA: Diagnosis not present

## 2015-05-07 LAB — HEMOGLOBIN A1C: HEMOGLOBIN A1C: 5.8 % (ref 4.6–6.5)

## 2015-05-07 NOTE — Addendum Note (Signed)
Addended by: Lurlean Nanny on: 05/07/2015 08:41 AM   Modules accepted: Orders

## 2015-05-14 ENCOUNTER — Telehealth: Payer: 59 | Admitting: Physician Assistant

## 2015-05-14 DIAGNOSIS — R6889 Other general symptoms and signs: Secondary | ICD-10-CM | POA: Diagnosis not present

## 2015-05-14 MED ORDER — OSELTAMIVIR PHOSPHATE 75 MG PO CAPS: 75.0000 mg | ORAL_CAPSULE | Freq: Two times a day (BID) | ORAL | Status: DC

## 2015-05-14 MED FILL — OSELTAMIVIR PHOS 75 MG CAP: 75 | 5 days supply | Qty: 10 | Fill #0

## 2015-05-14 NOTE — Progress Notes (Signed)

## 2015-06-01 MED FILL — TRIUMEQ 600-50-300 MG TABS: 600-50-300 | 30 days supply | Qty: 30 | Fill #2

## 2015-07-09 MED FILL — TRIUMEQ 600-50-300 MG TABS: 600-50-300 | 30 days supply | Qty: 30 | Fill #3

## 2015-08-19 MED FILL — TRIUMEQ 600-50-300 MG TABS: 600-50-300 | 30 days supply | Qty: 30 | Fill #4

## 2015-08-27 ENCOUNTER — Other Ambulatory Visit: Payer: 59

## 2015-08-31 ENCOUNTER — Other Ambulatory Visit: Payer: 59

## 2015-09-17 ENCOUNTER — Ambulatory Visit: Payer: 59 | Admitting: Infectious Diseases

## 2015-09-28 ENCOUNTER — Ambulatory Visit (INDEPENDENT_AMBULATORY_CARE_PROVIDER_SITE_OTHER): Payer: 59 | Admitting: Infectious Diseases

## 2015-09-28 ENCOUNTER — Encounter: Payer: Self-pay | Admitting: Infectious Diseases

## 2015-09-28 VITALS — BP 124/72 | HR 98 | Temp 98.3°F | Ht 71.0 in | Wt 219.0 lb

## 2015-09-28 DIAGNOSIS — Z113 Encounter for screening for infections with a predominantly sexual mode of transmission: Secondary | ICD-10-CM | POA: Diagnosis not present

## 2015-09-28 DIAGNOSIS — B20 Human immunodeficiency virus [HIV] disease: Secondary | ICD-10-CM | POA: Diagnosis not present

## 2015-09-28 DIAGNOSIS — Z79899 Other long term (current) drug therapy: Secondary | ICD-10-CM | POA: Diagnosis not present

## 2015-09-28 MED FILL — TRIUMEQ 600-50-300 MG TABS: 600-50-300 | 30 days supply | Qty: 30 | Fill #5

## 2015-09-28 NOTE — Progress Notes (Signed)
   Subjective:    Patient ID: Connor [name], male    DOB: 1985/11/04, 30 y.o.   MRN: [id number]  HPI 30 yo M with HIV+, acne. Has been doing well on art.  Has missed few doses over last month (1-2).  C/o freq urination.  See NP student note as well.   HIV 1 RNA QUANT (copies/mL)  Date Value  05/05/2015 <20  09/29/2014 <20  10/30/2013 <20   CD4 T CELL ABS (/uL)  Date Value  09/29/2014 420  10/30/2013 410  04/03/2013 340*     Review of Systems     Objective:   Physical Exam  Constitutional: He appears well-developed and well-nourished.  HENT:  Mouth/Throat: No oropharyngeal exudate.  Eyes: EOM are normal. Pupils are equal, round, and reactive to light.  Neck: Neck supple.  Cardiovascular: Normal rate, regular rhythm and normal heart sounds.   Pulmonary/Chest: Effort normal and breath sounds normal.  Abdominal: Soft. Bowel sounds are normal. There is no tenderness. There is no rebound.  Musculoskeletal: He exhibits no edema.  Lymphadenopathy:    He has no cervical adenopathy.       Assessment & Plan:

## 2015-09-28 NOTE — Addendum Note (Signed)
Addended by: HATCHER, JEFFREY C on: 09/28/2015 03:28 PM   Modules accepted: Orders

## 2015-09-28 NOTE — Assessment & Plan Note (Signed)
He is doing well.  Offered/refused condoms.  Will check his labs today.  Will check his UA, gc/chlamydia.  Will see him back in 1 year.

## 2015-09-29 LAB — URINALYSIS, ROUTINE W REFLEX MICROSCOPIC
BILIRUBIN URINE: NEGATIVE
GLUCOSE, UA: NEGATIVE
Hgb urine dipstick: NEGATIVE
KETONES UR: NEGATIVE
Leukocytes, UA: NEGATIVE
Nitrite: NEGATIVE
PH: 8 (ref 5.0–8.0)
Protein, ur: NEGATIVE
SPECIFIC GRAVITY, URINE: 1.022 (ref 1.001–1.035)

## 2015-09-29 LAB — VITAMIN D 25 HYDROXY (VIT D DEFICIENCY, FRACTURES): VIT D 25 HYDROXY: 44 ng/mL (ref 30–100)

## 2015-09-30 LAB — URINE CYTOLOGY ANCILLARY ONLY
Chlamydia: NEGATIVE
NEISSERIA GONORRHEA: NEGATIVE

## 2015-09-30 LAB — HIV-1 RNA QUANT-NO REFLEX-BLD
HIV 1 RNA Quant: <20 copies/mL (ref <20)
HIV-1 RNA Quant, Log: <1.3 Log copies/mL (ref <1.30)

## 2015-09-30 LAB — T-HELPER CELL (CD4) - (RCID CLINIC ONLY)
CD4 T CELL HELPER: 19 % — AB (ref 33–55)
CD4 T Cell Abs: 650 /uL (ref 400–2700)

## 2015-10-13 ENCOUNTER — Encounter: Payer: Self-pay | Admitting: Internal Medicine

## 2015-10-13 ENCOUNTER — Ambulatory Visit (INDEPENDENT_AMBULATORY_CARE_PROVIDER_SITE_OTHER): Payer: 59 | Admitting: Internal Medicine

## 2015-10-13 VITALS — BP 112/76 | HR 84 | Temp 98.7°F | Ht 70.25 in | Wt 221.8 lb

## 2015-10-13 DIAGNOSIS — R002 Palpitations: Secondary | ICD-10-CM | POA: Diagnosis not present

## 2015-10-13 DIAGNOSIS — F411 Generalized anxiety disorder: Secondary | ICD-10-CM

## 2015-10-13 LAB — TSH: TSH: 1.43 u[IU]/mL (ref 0.35–4.50)

## 2015-10-13 LAB — POTASSIUM: Potassium: 4.4 mEq/L (ref 3.5–5.1)

## 2015-10-13 LAB — MAGNESIUM: Magnesium: 2 mg/dL (ref 1.5–2.5)

## 2015-10-13 MED ORDER — ESCITALOPRAM OXALATE 10 MG PO TABS: 10.0000 mg | ORAL_TABLET | Freq: Every day | ORAL | Status: DC

## 2015-10-13 MED FILL — ESCITALOPRAM 10 MG TABLET: 10 | 30 days supply | Qty: 30 | Fill #0

## 2015-10-13 NOTE — Progress Notes (Addendum)
Subjective:    Patient ID: Connor [name], male    DOB: June 26, 1985, 29 y.o.   MRN: [id number]  HPI  Pt presents to the clinic today with c/o of intermittent palpitations. This started 3 weeks ago. He feels like at times, his heart is racing. He denies associated dizziness or chest pain, but is mildly short of breath. It goes away without intervention. He is not sure if this is related to anxiety or not, but reports he is more anxious lately. He is not sure what is triggering his anxiety. He has not had an ECG. His grandmother/grandfather had multiple heart attacks and bypass surgeries.  Review of Systems      Past Medical History  Diagnosis Date  . HIV infection [facility])     Current Outpatient Prescriptions  Medication Sig Dispense Refill  . TRIUMEQ 600-50-300 MG TABS TAKE 1 TABLET BY MOUTH DAILY. 30 tablet 5   No current facility-administered medications for this visit.    No Known Allergies  Family History  Problem Relation Age of Onset  . Hypertension Mother   . Alcohol abuse Father   . Hypertension Father   . Diabetes Maternal Grandmother   . Stroke Paternal Grandfather   . Cancer Neg Hx     Social History   Social History  . Marital Status: Single    Spouse Name: N/A  . Number of Children: N/A  . Years of Education: N/A   Occupational History  . Not on file.   Social History Main Topics  . Smoking status: Never Smoker   . Smokeless tobacco: Never Used  . Alcohol Use: No  . Drug Use: No  . Sexual Activity: Yes     Comment: refused condoms   Other Topics Concern  . Not on file   Social History Narrative     Constitutional: Denies fever, malaise, fatigue, headache or abrupt weight changes.  Respiratory: Pt reports intermittent shortness of breath. Denies difficulty breathing, cough or sputum production.   Cardiovascular: Pt reports palpitations. Denies chest pain, chest tightness, or swelling in the hands or feet.  Neurological: Denies dizziness,  difficulty with memory, difficulty with speech or problems with balance and coordination.  Psych: Pt reports anxiety. Denies depression, SI/HI.  No other specific complaints in a complete review of systems (except as listed in HPI above).  Objective:   Physical Exam   BP 112/76 mmHg  Pulse 84  Temp(Src) 98.7 F (37.1 C) (Oral)  Ht 5' 10.25" (1.784 m)  Wt 221 lb 12 oz (100.585 kg)  BMI 31.60 kg/m2  SpO2 97% Wt Readings from Last 3 Encounters:  10/13/15 221 lb 12 oz (100.585 kg)  09/28/15 219 lb (99.338 kg)  05/05/15 214 lb (97.07 kg)    General: Appears his stated age, well developed, well nourished in NAD. Neck:  Neck supple, trachea midline. No masses, lumps or thyromegaly present.  Cardiovascular: Normal rate and rhythm. S1,S2 noted.  No murmur, rubs or gallops noted.  Pulmonary/Chest: Normal effort and positive vesicular breath sounds. No respiratory distress. No wheezes, rales or ronchi noted.  Neurological: Alert and oriented.  Psychiatric: He is mildly anxious appearing today.  BMET    Component Value Date/Time   NA 141 05/05/2015 1014   K 4.0 05/05/2015 1014   CL 106 05/05/2015 1014   CO2 29 05/05/2015 1014   GLUCOSE 108* 05/05/2015 1014   BUN 16 05/05/2015 1014   CREATININE 1.13 05/05/2015 1014   CREATININE 1.00 09/29/2014 1407  CALCIUM 9.4 05/05/2015 1014   GFRNONAA >89 09/29/2014 1407   GFRAA >89 09/29/2014 1407    Lipid Panel     Component Value Date/Time   CHOL 132 05/05/2015 1014   TRIG 57.0 05/05/2015 1014   HDL 35.20* 05/05/2015 1014   CHOLHDL 4 05/05/2015 1014   VLDL 11.4 05/05/2015 1014   LDLCALC 86 05/05/2015 1014    CBC    Component Value Date/Time   WBC 8.0 05/05/2015 1014   RBC 4.40 05/05/2015 1014   HGB 13.3 05/05/2015 1014   HCT 40.6 05/05/2015 1014   PLT 167.0 05/05/2015 1014   MCV 92.3 05/05/2015 1014   MCH 30.9 09/29/2014 1407   MCHC 32.8 05/05/2015 1014   RDW 13.6 05/05/2015 1014   LYMPHSABS 2.6 09/29/2014 1407    MONOABS 0.8 09/29/2014 1407   EOSABS 0.2 09/29/2014 1407   BASOSABS 0.0 09/29/2014 1407    Hgb A1C Lab Results  Component Value Date   HGBA1C 5.8 05/07/2015        Assessment & Plan:   Palpitations:  ECG today normal Will check potassium, magnesium and TSH today He wants to go ahead with treatment of anxiety with SSRI. Discussed risk and benefits of treament, including side effect profile. eRx for Lexapro 10 mg QHS  Will follow up after labs, let me know how you are doing in 4 weeks via mychart.  Webb Silversmith, NP

## 2015-10-13 NOTE — Patient Instructions (Signed)

## 2015-10-13 NOTE — Addendum Note (Signed)
Addended by: Jearld Fenton on: 10/13/2015 09:56 AM   Modules accepted: Orders

## 2015-10-13 NOTE — Progress Notes (Signed)
Pre visit review using our clinic review tool, if applicable. No additional management support is needed unless otherwise documented below in the visit note. 

## 2015-10-27 ENCOUNTER — Other Ambulatory Visit: Payer: Self-pay | Admitting: *Deleted

## 2015-10-27 DIAGNOSIS — B20 Human immunodeficiency virus [HIV] disease: Secondary | ICD-10-CM

## 2015-10-27 MED ORDER — ABACAVIR-DOLUTEGRAVIR-LAMIVUD 600-50-300 MG PO TABS: 1.0000 | ORAL_TABLET | Freq: Every day | ORAL | Status: DC

## 2015-10-27 MED FILL — TRIUMEQ 600-50-300 MG TABS: 600-50-300 | 30 days supply | Qty: 30 | Fill #0

## 2015-10-29 ENCOUNTER — Encounter: Payer: Self-pay | Admitting: Internal Medicine

## 2015-11-02 MED ORDER — BUSPIRONE HCL 10 MG PO TABS: 10.0000 mg | ORAL_TABLET | Freq: Two times a day (BID) | ORAL | Status: DC

## 2015-11-02 MED FILL — busPIRone HCL 10 MG TABS: 10 | 30 days supply | Qty: 60 | Fill #0

## 2015-11-17 ENCOUNTER — Other Ambulatory Visit: Payer: Self-pay | Admitting: Internal Medicine

## 2015-11-17 ENCOUNTER — Telehealth: Payer: Self-pay

## 2015-11-17 ENCOUNTER — Encounter: Payer: Self-pay | Admitting: Internal Medicine

## 2015-11-17 MED ORDER — BUPROPION HCL ER (SR) 150 MG PO TB12: 150.0000 mg | ORAL_TABLET | Freq: Every day | ORAL | 2 refills | Status: DC

## 2015-11-17 NOTE — Telephone Encounter (Signed)
Elizabeth from [facility] left v/m; to verify; bupropion 150 mg 12 hr with instructions take one daily; this is new med for pt and Benjamine Mola said usually directions for bupropion 150 mg 12 hr tab are bid not daily. Please verify and Benjamine Mola request cb.

## 2015-11-17 NOTE — Telephone Encounter (Signed)
It thought it was XL. Ok to change to BID

## 2015-11-18 MED ORDER — BUPROPION HCL ER (SR) 150 MG PO TB12: 150.0000 mg | ORAL_TABLET | Freq: Two times a day (BID) | ORAL | 2 refills | Status: DC

## 2015-11-18 NOTE — Addendum Note (Signed)
Addended by: Lurlean Nanny on: 11/18/2015 08:40 AM   Modules accepted: Orders

## 2015-11-18 NOTE — Telephone Encounter (Signed)
Called pharmacy to change quantity and updated in chart

## 2015-11-19 MED FILL — BUPROPION HCL SR 150 MG TAB: 150 | 30 days supply | Qty: 60 | Fill #0

## 2015-12-03 DIAGNOSIS — H52223 Regular astigmatism, bilateral: Secondary | ICD-10-CM | POA: Diagnosis not present

## 2015-12-07 MED FILL — TRIUMEQ 600-50-300 MG TABS: 600-50-300 | 30 days supply | Qty: 30 | Fill #1

## 2015-12-18 MED FILL — BUPROPION SR 150 MG TABLET: 150 | 30 days supply | Qty: 60 | Fill #1

## 2016-01-20 MED FILL — TRIUMEQ 600-50-300 MG TABS: 600-50-300 | 30 days supply | Qty: 30 | Fill #2

## 2016-02-18 MED FILL — TRIUMEQ 600-50-300 MG TABS: 600-50-300 | 30 days supply | Qty: 30 | Fill #3

## 2016-04-07 MED FILL — TRIUMEQ 600-50-300 MG TABS: 600-50-300 | 30 days supply | Qty: 30 | Fill #4

## 2016-05-01 ENCOUNTER — Encounter: Payer: Self-pay | Admitting: Internal Medicine

## 2016-05-11 MED FILL — TRIUMEQ 600-50-300 MG TABS: 600-50-300 | 30 days supply | Qty: 30 | Fill #5 | Status: TO

## 2016-05-16 ENCOUNTER — Ambulatory Visit (INDEPENDENT_AMBULATORY_CARE_PROVIDER_SITE_OTHER): Payer: 59 | Admitting: Internal Medicine

## 2016-05-16 ENCOUNTER — Encounter: Payer: Self-pay | Admitting: Internal Medicine

## 2016-05-16 VITALS — BP 110/70 | HR 70 | Temp 97.9°F | Ht 70.0 in | Wt 238.0 lb

## 2016-05-16 DIAGNOSIS — B20 Human immunodeficiency virus [HIV] disease: Secondary | ICD-10-CM

## 2016-05-16 DIAGNOSIS — K5190 Ulcerative colitis, unspecified, without complications: Secondary | ICD-10-CM | POA: Diagnosis not present

## 2016-05-16 DIAGNOSIS — E559 Vitamin D deficiency, unspecified: Secondary | ICD-10-CM

## 2016-05-16 DIAGNOSIS — F32A Depression, unspecified: Secondary | ICD-10-CM

## 2016-05-16 DIAGNOSIS — Z23 Encounter for immunization: Secondary | ICD-10-CM | POA: Diagnosis not present

## 2016-05-16 DIAGNOSIS — Z0001 Encounter for general adult medical examination with abnormal findings: Secondary | ICD-10-CM | POA: Diagnosis not present

## 2016-05-16 DIAGNOSIS — E669 Obesity, unspecified: Secondary | ICD-10-CM | POA: Insufficient documentation

## 2016-05-16 DIAGNOSIS — E6609 Other obesity due to excess calories: Secondary | ICD-10-CM

## 2016-05-16 DIAGNOSIS — F418 Other specified anxiety disorders: Secondary | ICD-10-CM | POA: Diagnosis not present

## 2016-05-16 DIAGNOSIS — Z6834 Body mass index (BMI) 34.0-34.9, adult: Secondary | ICD-10-CM

## 2016-05-16 DIAGNOSIS — F419 Anxiety disorder, unspecified: Secondary | ICD-10-CM

## 2016-05-16 DIAGNOSIS — F329 Major depressive disorder, single episode, unspecified: Secondary | ICD-10-CM

## 2016-05-16 LAB — COMPREHENSIVE METABOLIC PANEL
ALK PHOS: 89 U/L (ref 39–117)
ALT: 11 U/L (ref 0–53)
AST: 17 U/L (ref 0–37)
Albumin: 4.2 g/dL (ref 3.5–5.2)
BILIRUBIN TOTAL: 0.2 mg/dL (ref 0.2–1.2)
BUN: 11 mg/dL (ref 6–23)
CALCIUM: 9.7 mg/dL (ref 8.4–10.5)
CO2: 28 mEq/L (ref 19–32)
Chloride: 105 mEq/L (ref 96–112)
Creatinine, Ser: 1.14 mg/dL (ref 0.40–1.50)
GFR: 95.27 mL/min (ref >60.00)
Glucose, Bld: 113 mg/dL — ABNORMAL HIGH (ref 70–99)
POTASSIUM: 3.8 meq/L (ref 3.5–5.1)
Sodium: 140 mEq/L (ref 135–145)
TOTAL PROTEIN: 7.5 g/dL (ref 6.0–8.3)

## 2016-05-16 LAB — CBC
HEMATOCRIT: 39.6 % (ref 39.0–52.0)
HEMOGLOBIN: 13.1 g/dL (ref 13.0–17.0)
MCHC: 33.2 g/dL (ref 30.0–36.0)
MCV: 94.3 fl (ref 78.0–100.0)
Platelets: 185 10*3/uL (ref 150.0–400.0)
RBC: 4.2 Mil/uL — AB (ref 4.22–5.81)
RDW: 13.2 % (ref 11.5–15.5)
WBC: 8.1 10*3/uL (ref 4.0–10.5)

## 2016-05-16 LAB — LIPID PANEL
Cholesterol: 148 mg/dL (ref 0–200)
HDL: 37.9 mg/dL — AB (ref >39.00)
LDL Cholesterol: 99 mg/dL (ref 0–99)
NonHDL: 110.16
TRIGLYCERIDES: 56 mg/dL (ref 0.0–149.0)
Total CHOL/HDL Ratio: 4
VLDL: 11.2 mg/dL (ref 0.0–40.0)

## 2016-05-16 LAB — TSH: TSH: 1.26 u[IU]/mL (ref 0.35–4.50)

## 2016-05-16 LAB — VITAMIN D 25 HYDROXY (VIT D DEFICIENCY, FRACTURES): VITD: 19.26 ng/mL — AB (ref 30.00–100.00)

## 2016-05-16 LAB — HEMOGLOBIN A1C: Hgb A1c MFr Bld: 5.8 % (ref 4.6–6.5)

## 2016-05-16 MED ORDER — CITALOPRAM HYDROBROMIDE 10 MG PO TABS: 10.0000 mg | ORAL_TABLET | Freq: Every day | ORAL | 2 refills | Status: DC

## 2016-05-16 MED FILL — CITALOPRAM HBR 10 MG TABLET: 10 | 30 days supply | Qty: 30 | Fill #0

## 2016-05-16 NOTE — Progress Notes (Signed)
Subjective:    Patient ID: Connor [name], male    DOB: 19-Mar-1986, 31 y.o.   MRN: [id number]  HPI  Pt presents to the clinic today for his annual exam. He is also due for follow up chronic conditions.  HIV: He is following with Dr. Johnnye Sima, note from 09/2015 reviewed. He is taking Triumeq as prescribed. His last CD4 was 650, HIV RNA non detectable.  Anxiety and Depression: Triggered by his diagnosis of HIV and life stress. He feels like this is worse lately. He has no interest in anything. He reports he eats all the time to try to make himself feel better, and he has a noticeable weight gain. He was prescribed Buspar and Wellbutrin at different times but both caused headaches. He was prescribed Lexapro, but reports that also made him feel weird. He thinks he needs something else for his anxiety and depression. He denies SI/HI.  Ulcerative Colitis: Dx in 2006/2007. He had a colonoscopy around that time. He has never taken any medications for this. He does not follow with GI  Vit D Deficiency: He reports his Vit D has been low in the past, where he needed prescription strength Vit D. He is not taking any Vit D OTC. He is requesting that we recheck his Vit D today.  Obesity: His weight today is 238 lbs, BMI 34.15. He has gained 24 lbs over the last year. He is not currently working on diet or exercise.  Flu: 12/2015 Tetanus: 04/2015 Pneumovax: 08/2009 Dentist: biannually  Diet: He does eat meat. He consumes fruits and veggies most days. He does eat some fried foods. He drinks juice, soda, and water. Exercise: None  Review of Systems      Past Medical History:  Diagnosis Date  . HIV infection [facility])     Current Outpatient Prescriptions  Medication Sig Dispense Refill  . abacavir-dolutegravir-lamiVUDine (TRIUMEQ) 600-50-300 MG tablet Take 1 tablet by mouth daily. 30 tablet 5  . buPROPion (WELLBUTRIN SR) 150 MG 12 hr tablet Take 1 tablet (150 mg total) by mouth 2 (two) times daily.  60 tablet 2   No current facility-administered medications for this visit.     No Known Allergies  Family History  Problem Relation Age of Onset  . Hypertension Mother   . Alcohol abuse Father   . Hypertension Father   . Diabetes Maternal Grandmother   . Stroke Paternal Grandfather   . Cancer Neg Hx     Social History   Social History  . Marital status: Single    Spouse name: N/A  . Number of children: N/A  . Years of education: N/A   Occupational History  . Not on file.   Social History Main Topics  . Smoking status: Never Smoker  . Smokeless tobacco: Never Used  . Alcohol use No  . Drug use: No  . Sexual activity: Yes     Comment: refused condoms   Other Topics Concern  . Not on file   Social History Narrative  . No narrative on file     Constitutional: Pt reports weight gain. Denies fever, malaise, fatigue, headache.  HEENT: Denies eye pain, eye redness, ear pain, ringing in the ears, wax buildup, runny nose, nasal congestion, bloody nose, or sore throat. Respiratory: Denies difficulty breathing, shortness of breath, cough or sputum production.   Cardiovascular: Denies chest pain, chest tightness, palpitations or swelling in the hands or feet.  Gastrointestinal: Pt reports intermittent reflux. Denies abdominal pain, bloating, constipation, diarrhea  or blood in the stool.  GU: Denies urgency, frequency, pain with urination, burning sensation, blood in urine, odor or discharge. Musculoskeletal: Pt reports intermittent back pain. Denies decrease in range of motion, difficulty with gait, or joint pain and swelling.  Skin: Denies redness, rashes, lesions or ulcercations.  Neurological: Denies dizziness, difficulty with memory, difficulty with speech or problems with balance and coordination.  Psych: Pt reports anxiety and depression. Denies SI/HI.  No other specific complaints in a complete review of systems (except as listed in HPI above).  Objective:    Physical Exam  BP 110/70   Pulse 70   Temp 97.9 F (36.6 C) (Oral)   Ht 5' 10"  (1.778 m)   Wt 238 lb (108 kg)   SpO2 97%   BMI 34.15 kg/m  Wt Readings from Last 3 Encounters:  05/16/16 238 lb (108 kg)  10/13/15 221 lb 12 oz (100.6 kg)  09/28/15 219 lb (99.3 kg)    General: Appears his stated age, obese, in NAD. Skin: Warm, dry and intact.  HEENT: Head: normal shape and size; Eyes: sclera white, no icterus, conjunctiva pink, PERRLA and EOMs intact; Ears: Tm's gray and intact, normal light reflex; Throat/Mouth: Teeth present, mucosa pink and moist, no exudate, lesions or ulcerations noted.  Neck:  Neck supple, trachea midline. No masses, lumps or thyromegaly present.  Cardiovascular: Normal rate and rhythm. S1,S2 noted.  No murmur, rubs or gallops noted. No JVD or BLE edema.  Pulmonary/Chest: Normal effort and positive vesicular breath sounds. No respiratory distress. No wheezes, rales or ronchi noted.  Abdomen: Soft and nontender. Normal bowel sounds. No distention or masses noted. Liver, spleen and kidneys non palpable. Musculoskeletal: No bony tenderness noted over the spine. Normal ROM of the spine. Strength 5/5 BUE/BLE. No difficulty with gait.  Neurological: Alert and oriented. Cranial nerves II-XII grossly intact. Coordination normal.  Psychiatric: Mood and affect mildly flat. Behavior is normal. Judgment and thought content normal.     BMET    Component Value Date/Time   NA 141 05/05/2015 1014   K 4.4 10/13/2015 1121   CL 106 05/05/2015 1014   CO2 29 05/05/2015 1014   GLUCOSE 108 (H) 05/05/2015 1014   BUN 16 05/05/2015 1014   CREATININE 1.13 05/05/2015 1014   CREATININE 1.00 09/29/2014 1407   CALCIUM 9.4 05/05/2015 1014   GFRNONAA >89 09/29/2014 1407   GFRAA >89 09/29/2014 1407    Lipid Panel     Component Value Date/Time   CHOL 132 05/05/2015 1014   TRIG 57.0 05/05/2015 1014   HDL 35.20 (L) 05/05/2015 1014   CHOLHDL 4 05/05/2015 1014   VLDL 11.4 05/05/2015  1014   LDLCALC 86 05/05/2015 1014    CBC    Component Value Date/Time   WBC 8.0 05/05/2015 1014   RBC 4.40 05/05/2015 1014   HGB 13.3 05/05/2015 1014   HCT 40.6 05/05/2015 1014   PLT 167.0 05/05/2015 1014   MCV 92.3 05/05/2015 1014   MCH 30.9 09/29/2014 1407   MCHC 32.8 05/05/2015 1014   RDW 13.6 05/05/2015 1014   LYMPHSABS 2.6 09/29/2014 1407   MONOABS 0.8 09/29/2014 1407   EOSABS 0.2 09/29/2014 1407   BASOSABS 0.0 09/29/2014 1407    Hgb A1C Lab Results  Component Value Date   HGBA1C 5.8 05/07/2015        Assessment & Plan:   Preventative Health:  Flu and tetanus UTD Pneumovax given today Encouraged him to consume a balanced diet and exercise regimen Advised  him to see a dentist annually Will check CBC, CEMT, Lipid, TSH and A1C today  RTC in 1 year, sooner if needed Webb Silversmith, NP

## 2016-05-16 NOTE — Assessment & Plan Note (Signed)
Continue Triumeq He will continue to follow with Dr. Johnnye Sima

## 2016-05-16 NOTE — Assessment & Plan Note (Signed)
Deteriorated Failed multiple drugs Will try Celexa, eRx sent to pharmacy He will update me in 4 weeks and let me know how he is doing

## 2016-05-16 NOTE — Assessment & Plan Note (Signed)
Will check Vit D today. If less than 25, will send RX for Ergocalciferol 50000 units once weekly x 12 weeks If only mildly low, will advise him to take 2000 units of Vit D OTC with a calcium supplement.

## 2016-05-16 NOTE — Assessment & Plan Note (Signed)
Encouraged him to consume a 1700 calorie diet and start exercising for 30 minutes 3 days a week

## 2016-05-16 NOTE — Assessment & Plan Note (Signed)
Asymptomatic No treatment at this time Will monitor

## 2016-05-16 NOTE — Patient Instructions (Signed)

## 2016-05-17 ENCOUNTER — Encounter: Payer: Self-pay | Admitting: Internal Medicine

## 2016-05-18 MED ORDER — VITAMIN D (ERGOCALCIFEROL) 1.25 MG (50000 UNIT) PO CAPS: 50000.0000 [IU] | ORAL_CAPSULE | ORAL | 0 refills | Status: DC

## 2016-05-19 ENCOUNTER — Telehealth: Payer: 59 | Admitting: Nurse Practitioner

## 2016-05-19 DIAGNOSIS — R6889 Other general symptoms and signs: Secondary | ICD-10-CM | POA: Diagnosis not present

## 2016-05-19 MED ORDER — OSELTAMIVIR PHOSPHATE 75 MG PO CAPS: 75.0000 mg | ORAL_CAPSULE | Freq: Two times a day (BID) | ORAL | 0 refills | Status: AC

## 2016-05-19 MED FILL — OSELTAMIVIR PHOS 75 MG CAP: 75 | 5 days supply | Qty: 10 | Fill #0

## 2016-05-19 MED FILL — VIT D2 1.25 MG (50,000 UNIT: 1.25 MG | 84 days supply | Qty: 12 | Fill #0

## 2016-05-19 NOTE — Progress Notes (Signed)

## 2016-05-20 ENCOUNTER — Other Ambulatory Visit: Payer: Self-pay | Admitting: Infectious Diseases

## 2016-05-20 DIAGNOSIS — B20 Human immunodeficiency virus [HIV] disease: Secondary | ICD-10-CM

## 2016-07-04 MED FILL — TRIUMEQ 600-50-300 MG TABS: 600-50-300 | 30 days supply | Qty: 30 | Fill #0

## 2016-07-11 DIAGNOSIS — H52223 Regular astigmatism, bilateral: Secondary | ICD-10-CM | POA: Diagnosis not present

## 2016-07-19 ENCOUNTER — Encounter: Payer: Self-pay | Admitting: Infectious Diseases

## 2016-07-19 ENCOUNTER — Ambulatory Visit (INDEPENDENT_AMBULATORY_CARE_PROVIDER_SITE_OTHER): Payer: 59 | Admitting: Infectious Diseases

## 2016-07-19 ENCOUNTER — Other Ambulatory Visit: Payer: Self-pay | Admitting: Infectious Diseases

## 2016-07-19 ENCOUNTER — Other Ambulatory Visit (HOSPITAL_COMMUNITY)
Admission: RE | Admit: 2016-07-19 | Discharge: 2016-07-19 | Disposition: A | Discharge status: Home / Self Care | Payer: 59 | Source: Ambulatory Visit | Attending: Infectious Diseases | Admitting: Infectious Diseases

## 2016-07-19 VITALS — BP 120/82 | HR 82 | Temp 97.6°F | Wt 223.0 lb

## 2016-07-19 DIAGNOSIS — Z79899 Other long term (current) drug therapy: Secondary | ICD-10-CM | POA: Diagnosis not present

## 2016-07-19 DIAGNOSIS — F329 Major depressive disorder, single episode, unspecified: Secondary | ICD-10-CM

## 2016-07-19 DIAGNOSIS — Z113 Encounter for screening for infections with a predominantly sexual mode of transmission: Secondary | ICD-10-CM | POA: Insufficient documentation

## 2016-07-19 DIAGNOSIS — Z23 Encounter for immunization: Secondary | ICD-10-CM | POA: Diagnosis not present

## 2016-07-19 DIAGNOSIS — F419 Anxiety disorder, unspecified: Secondary | ICD-10-CM

## 2016-07-19 DIAGNOSIS — B20 Human immunodeficiency virus [HIV] disease: Secondary | ICD-10-CM

## 2016-07-19 DIAGNOSIS — F418 Other specified anxiety disorders: Secondary | ICD-10-CM | POA: Diagnosis not present

## 2016-07-19 DIAGNOSIS — F32A Depression, unspecified: Secondary | ICD-10-CM

## 2016-07-19 MED ORDER — FLUOXETINE HCL 10 MG PO TABS: 10.0000 mg | ORAL_TABLET | Freq: Every day | ORAL | 3 refills | Status: DC

## 2016-07-19 MED FILL — FLUoxetine HCL 10 MG TABS: 10 | 30 days supply | Qty: 30 | Fill #0

## 2016-07-19 NOTE — Progress Notes (Signed)
   Subjective:    Patient ID: Connor [name], male    DOB: 24-Nov-1985, 31 y.o.   MRN: [id number]  HPI 31 yo M with hx of HIV/AIDS, was having difficulty with his ART previously ( headaches, trouble sleeping). Was changed to complera April 2013, then to triumeq January 2015 (due to food issues). Has received Hep B series x 2.   Has been feeling well except for being tired. Was seen by NP and was noted to have low Vitamin D. He was started Vit D weekly.    HIV 1 RNA Quant (copies/mL)  Date Value  09/28/2015 <20  05/05/2015 <20  09/29/2014 <20   CD4 T Cell Abs (/uL)  Date Value  09/28/2015 650  09/29/2014 420  10/30/2013 410    Review of Systems  Constitutional: Positive for fatigue. Negative for appetite change and unexpected weight change.  Respiratory: Negative for shortness of breath.   Gastrointestinal: Negative for constipation and diarrhea.  Genitourinary: Negative for difficulty urinating.  Neurological: Negative for headaches.  Psychiatric/Behavioral: Negative for sleep disturbance.  not exercising.      Objective:   Physical Exam  Constitutional: He appears well-developed and well-nourished.  HENT:  Mouth/Throat: No oropharyngeal exudate.  Eyes: EOM are normal. Pupils are equal, round, and reactive to light.  Neck: Neck supple.  Cardiovascular: Normal rate, regular rhythm and normal heart sounds.   Pulmonary/Chest: Effort normal and breath sounds normal.  Abdominal: Soft. Bowel sounds are normal. There is no tenderness. There is no rebound.  Musculoskeletal: He exhibits no edema.  Lymphadenopathy:    He has no cervical adenopathy.       Assessment & Plan:

## 2016-07-19 NOTE — Addendum Note (Signed)
Addended by: Reggy Eye on: 07/19/2016 03:08 PM   Modules accepted: Orders

## 2016-07-19 NOTE — Assessment & Plan Note (Signed)
Offered/refused condoms.  Will give him mening vax Will check CD4 and VL, gc/chlamydia, rpr today.  Will see him back in 12 months.

## 2016-07-19 NOTE — Assessment & Plan Note (Addendum)
Was started on celexa and has made him angry.  Was prev on wellbutrin which gave him headaches.  Has tried zoloft which did not help.  Was also on lexapro prev as well.  Will give him low dose of prozac.  Describes his sx as more related to anxiety Denies intent to self harm.  Will f/u with PCP.

## 2016-07-20 LAB — RPR: RPR Ser Ql: REACTIVE — AB

## 2016-07-20 LAB — URINE CYTOLOGY ANCILLARY ONLY
CHLAMYDIA, DNA PROBE: NEGATIVE
NEISSERIA GONORRHEA: NEGATIVE

## 2016-07-20 LAB — T-HELPER CELL (CD4) - (RCID CLINIC ONLY)
CD4 T CELL ABS: 840 /uL (ref 400–2700)
CD4 T CELL HELPER: 21 % — AB (ref 33–55)

## 2016-07-20 LAB — RPR TITER: RPR Titer: 1:4 {titer}

## 2016-07-20 LAB — FLUORESCENT TREPONEMAL AB(FTA)-IGG-BLD: Fluorescent Treponemal ABS: REACTIVE — AB

## 2016-07-21 LAB — HIV-1 RNA,QN PCR W/REFLEX GENOTYPE: HIV-1 RNA, QN PCR: <20 Copies/mL

## 2016-07-27 ENCOUNTER — Ambulatory Visit (INDEPENDENT_AMBULATORY_CARE_PROVIDER_SITE_OTHER): Payer: 59 | Admitting: *Deleted

## 2016-07-27 ENCOUNTER — Telehealth: Payer: Self-pay | Admitting: *Deleted

## 2016-07-27 DIAGNOSIS — A539 Syphilis, unspecified: Secondary | ICD-10-CM | POA: Diagnosis not present

## 2016-07-27 MED ORDER — PENICILLIN G BENZATHINE 1200000 UNIT/2ML IM SUSP
1.2000 10*6.[IU] | Freq: Once | INTRAMUSCULAR | Status: AC
  Administered 2016-07-27: 1.2 10*6.[IU] via INTRAMUSCULAR

## 2016-07-27 NOTE — Telephone Encounter (Signed)
Patient called and adivsed he checked his MyChart and was he was +RPR. He wants to come for treatment today. Advised him will have to get an order from the doctor on treatment and to come in the afternoon.   Per Verbal from Dr Linus Salmons patient is to be treated with 1 dose of 2.4 million Bicillian. Order repeated and verified.

## 2016-08-01 MED FILL — TRIUMEQ 600-50-300 MG TABS: 600-50-300 | 30 days supply | Qty: 30 | Fill #1

## 2016-09-19 MED FILL — TRIUMEQ 600-50-300 MG TABS: 600-50-300 | 30 days supply | Qty: 30 | Fill #2

## 2016-10-09 ENCOUNTER — Encounter: Payer: Self-pay | Admitting: Internal Medicine

## 2016-10-09 DIAGNOSIS — E559 Vitamin D deficiency, unspecified: Secondary | ICD-10-CM

## 2016-10-10 ENCOUNTER — Other Ambulatory Visit (INDEPENDENT_AMBULATORY_CARE_PROVIDER_SITE_OTHER): Payer: Managed Care, Other (non HMO)

## 2016-10-10 DIAGNOSIS — E559 Vitamin D deficiency, unspecified: Secondary | ICD-10-CM | POA: Diagnosis not present

## 2016-10-10 LAB — VITAMIN D 25 HYDROXY (VIT D DEFICIENCY, FRACTURES): VITD: 29.6 ng/mL — AB (ref 30.00–100.00)

## 2016-10-14 MED FILL — TRIUMEQ 600-50-300 MG TABS: 600-50-300 | 30 days supply | Qty: 30 | Fill #3

## 2016-10-20 ENCOUNTER — Encounter: Payer: Self-pay | Admitting: Gastroenterology

## 2016-11-07 MED FILL — TRIUMEQ 600-50-300 MG TABS: 600-50-300 | 30 days supply | Qty: 30 | Fill #4

## 2016-11-22 ENCOUNTER — Encounter: Payer: Self-pay | Admitting: Internal Medicine

## 2016-11-30 MED FILL — TRIUMEQ 600-50-300 MG TABS: 600-50-300 | 30 days supply | Qty: 30 | Fill #5

## 2016-12-01 ENCOUNTER — Encounter: Payer: Self-pay | Admitting: Internal Medicine

## 2016-12-01 ENCOUNTER — Ambulatory Visit (INDEPENDENT_AMBULATORY_CARE_PROVIDER_SITE_OTHER): Payer: Managed Care, Other (non HMO) | Admitting: Internal Medicine

## 2016-12-01 VITALS — BP 120/78 | HR 77 | Temp 98.0°F | Wt 237.0 lb

## 2016-12-01 DIAGNOSIS — M545 Low back pain, unspecified: Secondary | ICD-10-CM

## 2016-12-01 MED ORDER — METHOCARBAMOL 500 MG PO TABS: 500.0000 mg | ORAL_TABLET | Freq: Three times a day (TID) | ORAL | 0 refills | Status: DC | PRN

## 2016-12-01 MED FILL — METHOCARBAMOL 500 MG TABLET: 500 | 7 days supply | Qty: 20 | Fill #0

## 2016-12-01 NOTE — Patient Instructions (Signed)

## 2016-12-01 NOTE — Progress Notes (Signed)
Subjective:    Patient ID: Connor [name], male    DOB: April 10, 1986, 31 y.o.   MRN: [id number]  HPI  Pt presents to the clinic today with c/o low back pain. He reorts this started about 10 days ago. He describes the pain as achy and tight. The pain radiates into his right upper thigh. He denies numbness or tingling in his right leg. He denies loss of bowel or bladder. He denies any injury to his back. The pain seems worse with sitting and better with laying down or standing. He has tried Ibuprofen, pain patches and massage with some relief.   Review of Systems  Past Medical History:  Diagnosis Date  . Anxiety   . Depression   . History of syphilis   . HIV infection ([place])   . Ulcerative colitis ([place])   . Vitamin D deficiency     Current Outpatient Prescriptions  Medication Sig Dispense Refill  . FLUoxetine (PROZAC) 10 MG tablet Take 1 tablet (10 mg total) by mouth daily. 30 tablet 3  . TRIUMEQ 600-50-300 MG tablet TAKE 1 TABLET BY MOUTH DAILY. 30 tablet 5  . methocarbamol (ROBAXIN) 500 MG tablet Take 1 tablet (500 mg total) by mouth every 8 (eight) hours as needed for muscle spasms. 20 tablet 0   No current facility-administered medications for this visit.     No Known Allergies  Family History  Problem Relation Age of Onset  . Hypertension Mother   . Alcohol abuse Father   . Hypertension Father   . Diabetes Maternal Grandmother   . Stroke Paternal Grandfather   . Cancer Neg Hx     Social History   Social History  . Marital status: Single    Spouse name: N/A  . Number of children: N/A  . Years of education: N/A   Occupational History  . Not on file.   Social History Main Topics  . Smoking status: Never Smoker  . Smokeless tobacco: Never Used  . Alcohol use No  . Drug use: No  . Sexual activity: Yes     Comment: refused condoms   Other Topics Concern  . Not on file   Social History Narrative  . No narrative on file     Constitutional: Denies  fever, malaise, fatigue, headache or abrupt weight changes.  Gastrointestinal: Denies abdominal pain, bloating, constipation, diarrhea or blood in the stool.  GU: Denies urgency, frequency, pain with urination, burning sensation, blood in urine, odor or discharge. Musculoskeletal: Pt reports back pain. Denies decrease in range of motion, difficulty with gait,  or joint pain and swelling.   Neurological: Denies dizziness, difficulty with memory, difficulty with speech or problems with balance and coordination.    No other specific complaints in a complete review of systems (except as listed in HPI above).     Objective:   Physical Exam   BP 120/78   Pulse 77   Temp 98 F (36.7 C) (Oral)   Wt 237 lb (107.5 kg)   SpO2 98%   BMI 34.01 kg/m  Wt Readings from Last 3 Encounters:  12/01/16 237 lb (107.5 kg)  07/19/16 223 lb (101.2 kg)  05/16/16 238 lb (108 kg)    General: Appears his stated age, obese in NAD. Musculoskeletal: Normal extension, rotation and lateral bending of the spine. Pain with flexion. No bony tenderness noted over the spine. Pain with palpation of the right paralumbar muscles. Strength 5/5 BLE. No difficulty with gait.  Neurological: Alert and  oriented. Sensation intact to BLE.   BMET    Component Value Date/Time   NA 140 05/16/2016 0856   K 3.8 05/16/2016 0856   CL 105 05/16/2016 0856   CO2 28 05/16/2016 0856   GLUCOSE 113 (H) 05/16/2016 0856   BUN 11 05/16/2016 0856   CREATININE 1.14 05/16/2016 0856   CREATININE 1.00 09/29/2014 1407   CALCIUM 9.7 05/16/2016 0856   GFRNONAA >89 09/29/2014 1407   GFRAA >89 09/29/2014 1407    Lipid Panel     Component Value Date/Time   CHOL 148 05/16/2016 0856   TRIG 56.0 05/16/2016 0856   HDL 37.90 (L) 05/16/2016 0856   CHOLHDL 4 05/16/2016 0856   VLDL 11.2 05/16/2016 0856   LDLCALC 99 05/16/2016 0856    CBC    Component Value Date/Time   WBC 8.1 05/16/2016 0856   RBC 4.20 (L) 05/16/2016 0856   HGB 13.1  05/16/2016 0856   HCT 39.6 05/16/2016 0856   PLT 185.0 05/16/2016 0856   MCV 94.3 05/16/2016 0856   MCH 30.9 09/29/2014 1407   MCHC 33.2 05/16/2016 0856   RDW 13.2 05/16/2016 0856   LYMPHSABS 2.6 09/29/2014 1407   MONOABS 0.8 09/29/2014 1407   EOSABS 0.2 09/29/2014 1407   BASOSABS 0.0 09/29/2014 1407    Hgb A1C Lab Results  Component Value Date   HGBA1C 5.8 05/16/2016           Assessment & Plan:   Right Side Low Back Pain:  Muscular in origin Continue NSAID's Stretching exercised given Heat may be helpful eRx for Robaxin 500 mg TID prn, sedation caution given  Return precautions discussed Webb Silversmith, NP

## 2016-12-06 ENCOUNTER — Ambulatory Visit: Payer: Managed Care, Other (non HMO) | Admitting: Gastroenterology

## 2017-01-10 ENCOUNTER — Ambulatory Visit: Payer: Self-pay | Admitting: Internal Medicine

## 2017-01-26 ENCOUNTER — Other Ambulatory Visit: Payer: Self-pay

## 2017-01-26 ENCOUNTER — Ambulatory Visit: Payer: Managed Care, Other (non HMO) | Admitting: Gastroenterology

## 2017-01-30 ENCOUNTER — Other Ambulatory Visit: Payer: Self-pay | Admitting: Infectious Diseases

## 2017-01-30 DIAGNOSIS — B20 Human immunodeficiency virus [HIV] disease: Secondary | ICD-10-CM

## 2017-02-17 ENCOUNTER — Other Ambulatory Visit: Payer: Managed Care, Other (non HMO)

## 2017-02-17 DIAGNOSIS — Z113 Encounter for screening for infections with a predominantly sexual mode of transmission: Secondary | ICD-10-CM

## 2017-02-17 DIAGNOSIS — B20 Human immunodeficiency virus [HIV] disease: Secondary | ICD-10-CM

## 2017-02-17 LAB — T-HELPER CELL (CD4) - (RCID CLINIC ONLY)
CD4 T CELL ABS: 860 /uL (ref 400–2700)
CD4 T CELL HELPER: 21 % — AB (ref 33–55)

## 2017-02-20 LAB — FLUORESCENT TREPONEMAL AB(FTA)-IGG-BLD: FLUORESCENT TREPONEMAL ABS: REACTIVE — AB

## 2017-02-20 LAB — COMPLETE METABOLIC PANEL WITH GFR
AG Ratio: 1.4 (calc) (ref 1.0–2.5)
ALKALINE PHOSPHATASE (APISO): 102 U/L (ref 40–115)
ALT: 12 U/L (ref 9–46)
AST: 16 U/L (ref 10–40)
Albumin: 4.2 g/dL (ref 3.6–5.1)
BUN: 16 mg/dL (ref 7–25)
CO2: 26 mmol/L (ref 20–32)
CREATININE: 1.24 mg/dL (ref 0.60–1.35)
Calcium: 9.6 mg/dL (ref 8.6–10.3)
Chloride: 103 mmol/L (ref 98–110)
GFR, Est African American: 88 mL/min/{1.73_m2} (ref >=60)
GFR, Est Non African American: 76 mL/min/{1.73_m2} (ref >=60)
GLUCOSE: 89 mg/dL (ref 65–99)
Globulin: 3.1 g/dL (calc) (ref 1.9–3.7)
Potassium: 3.8 mmol/L (ref 3.5–5.3)
SODIUM: 140 mmol/L (ref 135–146)
Total Bilirubin: 0.3 mg/dL (ref 0.2–1.2)
Total Protein: 7.3 g/dL (ref 6.1–8.1)

## 2017-02-20 LAB — CBC WITH DIFFERENTIAL/PLATELET
BASOS ABS: 44 {cells}/uL (ref 0–200)
Basophils Relative: 0.4 %
EOS PCT: 2.6 %
Eosinophils Absolute: 286 cells/uL (ref 15–500)
HCT: 36 % — ABNORMAL LOW (ref 38.5–50.0)
Hemoglobin: 12.4 g/dL — ABNORMAL LOW (ref 13.2–17.1)
Lymphs Abs: 3762 cells/uL (ref 850–3900)
MCH: 30.8 pg (ref 27.0–33.0)
MCHC: 34.4 g/dL (ref 32.0–36.0)
MCV: 89.3 fL (ref 80.0–100.0)
MONOS PCT: 8.6 %
MPV: 12 fL (ref 7.5–12.5)
NEUTROS ABS: 5962 {cells}/uL (ref 1500–7800)
NEUTROS PCT: 54.2 %
PLATELETS: 192 10*3/uL (ref 140–400)
RBC: 4.03 10*6/uL — ABNORMAL LOW (ref 4.20–5.80)
RDW: 12.5 % (ref 11.0–15.0)
Total Lymphocyte: 34.2 %
WBC mixed population: 946 cells/uL (ref 200–950)
WBC: 11 10*3/uL — AB (ref 3.8–10.8)

## 2017-02-20 LAB — RPR: RPR: REACTIVE — AB

## 2017-02-20 LAB — RPR TITER: RPR Titer: 1:4 {titer} — ABNORMAL HIGH

## 2017-02-21 LAB — HIV-1 RNA QUANT-NO REFLEX-BLD
HIV 1 RNA Quant: <20 copies/mL
HIV-1 RNA QUANT, LOG: NOT DETECTED {Log_copies}/mL

## 2017-02-22 ENCOUNTER — Telehealth: Payer: Self-pay | Admitting: Pharmacy Technician

## 2017-02-22 NOTE — Telephone Encounter (Signed)
Mr. Grabe is past due for a refill of Triumeq.  Wall and I have called multiple times (leaving voicemails) and emailed once without a response.  Last fill was 11/30/16

## 2017-02-27 ENCOUNTER — Ambulatory Visit (INDEPENDENT_AMBULATORY_CARE_PROVIDER_SITE_OTHER): Payer: Managed Care, Other (non HMO) | Admitting: Infectious Diseases

## 2017-02-27 ENCOUNTER — Encounter: Payer: Self-pay | Admitting: Infectious Diseases

## 2017-02-27 DIAGNOSIS — B20 Human immunodeficiency virus [HIV] disease: Secondary | ICD-10-CM | POA: Diagnosis not present

## 2017-02-27 DIAGNOSIS — Z6834 Body mass index (BMI) 34.0-34.9, adult: Secondary | ICD-10-CM

## 2017-02-27 DIAGNOSIS — A539 Syphilis, unspecified: Secondary | ICD-10-CM

## 2017-02-27 DIAGNOSIS — E6609 Other obesity due to excess calories: Secondary | ICD-10-CM | POA: Diagnosis not present

## 2017-02-27 NOTE — Progress Notes (Signed)
   Subjective:    Patient ID: Connor [name], male    DOB: 1986-04-09, 31 y.o.   MRN: [id number]  HPI 31 yo M with hx of HIV/AIDS, was having difficulty with his ART previously ( headaches, trouble sleeping). Was changed to complera April 2013, then to triumeq January 2015 (due to food issues). Has received Hep B series x 2.   Doing well with triumeq.  Has gotten flu shot.  Working 3rd shift, always sleepy.   HIV 1 RNA Quant (copies/mL)  Date Value  02/17/2017 <20 NOT DETECTED  09/28/2015 <20  05/05/2015 <20   CD4 T Cell Abs (/uL)  Date Value  02/17/2017 860  07/19/2016 840  09/28/2015 650    Review of Systems  Constitutional: Negative for appetite change and unexpected weight change.  Respiratory: Negative for cough and shortness of breath.   Cardiovascular: Negative for chest pain.  Gastrointestinal: Negative for constipation and diarrhea.  Genitourinary: Negative for difficulty urinating.  Neurological: Negative for headaches.  Psychiatric/Behavioral: Negative for dysphoric mood.  no exercise.  Please see HPI. All other systems reviewed and negative.     Objective:   Physical Exam  Constitutional: He appears well-developed and well-nourished.  HENT:  Mouth/Throat: No oropharyngeal exudate.  Eyes: EOM are normal. Pupils are equal, round, and reactive to light.  Neck: Neck supple.  Cardiovascular: Normal rate, regular rhythm and normal heart sounds.  Pulmonary/Chest: Effort normal and breath sounds normal.  Abdominal: Soft. Bowel sounds are normal. There is no tenderness. There is no rebound.  Musculoskeletal: He exhibits no edema.  Lymphadenopathy:    He has no cervical adenopathy.  Psychiatric: He has a normal mood and affect.      Assessment & Plan:

## 2017-02-27 NOTE — Assessment & Plan Note (Signed)
Encouraged him to start exercise program.

## 2017-02-27 NOTE — Assessment & Plan Note (Signed)
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  If he continues to have fatigue ( I suggested due to 3rd shift work), will check TSH Has gotten flu shot.  Offered/refused condoms.  rtc in 9 months.

## 2017-02-27 NOTE — Assessment & Plan Note (Signed)
He has been serofast.  Will continue to watch.  Explained to pt.

## 2017-03-03 ENCOUNTER — Encounter: Payer: Self-pay | Admitting: Infectious Diseases

## 2017-03-14 MED FILL — TRIUMEQ 600-50-300 MG TABS: 600-50-300 | 30 days supply | Qty: 30 | Fill #0

## 2017-04-12 MED FILL — TRIUMEQ 600-50-300 MG TABS: 600-50-300 | 30 days supply | Qty: 30 | Fill #1

## 2017-04-17 ENCOUNTER — Telehealth: Payer: Managed Care, Other (non HMO) | Admitting: Family

## 2017-04-17 DIAGNOSIS — R197 Diarrhea, unspecified: Secondary | ICD-10-CM

## 2017-04-17 MED ORDER — CIPROFLOXACIN HCL 500 MG PO TABS: 500.0000 mg | ORAL_TABLET | Freq: Two times a day (BID) | ORAL | 0 refills | Status: DC

## 2017-04-17 MED FILL — CIPROFLOXACIN HCL 500 MG TA: 500 | 5 days supply | Qty: 10 | Fill #0

## 2017-04-17 NOTE — Progress Notes (Signed)
We are sorry that you are not feeling well.  Here is how we plan to help!  Based on what you have shared with me it looks like you have Acute Infectious Diarrhea.  Most cases of acute diarrhea are due to infections with virus and bacteria and are self-limited conditions lasting less than 14 days.  For your symptoms you may take Imodium 2 mg tablets that are over the counter at your local pharmacy. Take two tablet now and then one after each loose stool up to 6 a day.  Antibiotics are not needed for most people with diarrhea.   I have sent in Cipro 500 mg two tablets twice a day for five days.  HOME CARE  We recommend changing your diet to help with your symptoms for the next few days.  Drink plenty of fluids that contain water salt and sugar. Sports drinks such as Gatorade may help.   You may try broths, soups, bananas, applesauce, soft breads, mashed potatoes or crackers.   You are considered infectious for as long as the diarrhea continues. Hand washing or use of alcohol based hand sanitizers is recommend.  It is best to stay out of work or school until your symptoms stop.   GET HELP RIGHT AWAY  If you have dark yellow colored urine or do not pass urine frequently you should drink more fluids.    If your symptoms worsen   If you feel like you are going to pass out (faint)  You have a new problem  MAKE SURE YOU   Understand these instructions.  Will watch your condition.  Will get help right away if you are not doing well or get worse.  Your e-visit answers were reviewed by a board certified advanced clinical practitioner to complete your personal care plan.  Depending on the condition, your plan could have included both over the counter or prescription medications.  If there is a problem please reply  once you have received a response from your provider.  Your safety is important to [place].  If you have drug allergies check your prescription carefully.    You can use  MyChart to ask questions about today's visit, request a non-urgent call back, or ask for a work or school excuse for 24 hours related to this e-Visit. If it has been greater than 24 hours you will need to follow up with your provider, or enter a new e-Visit to address those concerns.   You will get an e-mail in the next two days asking about your experience.  I hope that your e-visit has been valuable and will speed your recovery. Thank you for using e-visits.

## 2017-04-26 ENCOUNTER — Encounter: Payer: Self-pay | Admitting: Internal Medicine

## 2017-04-26 ENCOUNTER — Ambulatory Visit (INDEPENDENT_AMBULATORY_CARE_PROVIDER_SITE_OTHER): Payer: Managed Care, Other (non HMO) | Admitting: Internal Medicine

## 2017-04-26 VITALS — BP 124/80 | HR 82 | Temp 98.2°F | Wt 240.0 lb

## 2017-04-26 DIAGNOSIS — R1013 Epigastric pain: Secondary | ICD-10-CM

## 2017-04-26 NOTE — Progress Notes (Signed)
Subjective:    Patient ID: Connor [name], male    DOB: [birth year], 32 y.o.   MRN: [id number]  HPI  Pt presents to the clinic today to follow up diarrhea. He reports this started 04/16/17. He reports symptoms started about 12 hours after eating at [facility]. He had a burger and fries. He was having 8+ stools per day initially. He has not had any loose stools in over a week. His stool is white in color, which is concerning to him. He has some associated nausea and epigastric pain but denies vomiting or blood in his stool. He has taking Ranitidine with minimal relief. He did an E-visit 12/21. He was given a 5 day course of Cipro. He denies recent travel or antibiotic use outside of the Cipro that was prescribed. He denies recent changes in diet. No one around him has had similar symptoms.   Review of Systems      Past Medical History:  Diagnosis Date  . Anxiety   . Depression   . History of syphilis   . HIV infection ([place])   . Ulcerative colitis ([place])   . Vitamin D deficiency     Current Outpatient Medications  Medication Sig Dispense Refill  . ciprofloxacin (CIPRO) 500 MG tablet Take 1 tablet (500 mg total) by mouth 2 (two) times daily. 10 tablet 0  . FLUoxetine (PROZAC) 10 MG tablet Take 1 tablet (10 mg total) by mouth daily. (Patient not taking: Reported on 02/27/2017) 30 tablet 3  . methocarbamol (ROBAXIN) 500 MG tablet Take 1 tablet (500 mg total) by mouth every 8 (eight) hours as needed for muscle spasms. (Patient not taking: Reported on 02/27/2017) 20 tablet 0  . TRIUMEQ 600-50-300 MG tablet TAKE 1 TABLET BY MOUTH DAILY. 30 tablet 5   No current facility-administered medications for this visit.     No Known Allergies  Family History  Problem Relation Age of Onset  . Hypertension Mother   . Alcohol abuse Father   . Hypertension Father   . Diabetes Maternal Grandmother   . Stroke Paternal Grandfather   . Cancer Neg Hx     Social History   Socioeconomic History   . Marital status: Single    Spouse name: Not on file  . Number of children: Not on file  . Years of education: Not on file  . Highest education level: Not on file  Social Needs  . Financial resource strain: Not on file  . Food insecurity - worry: Not on file  . Food insecurity - inability: Not on file  . Transportation needs - medical: Not on file  . Transportation needs - non-medical: Not on file  Occupational History  . Not on file  Tobacco Use  . Smoking status: Never Smoker  . Smokeless tobacco: Never Used  Substance and Sexual Activity  . Alcohol use: No    Alcohol/week: 0.0 oz  . Drug use: No  . Sexual activity: Yes    Comment: refused condoms  Other Topics Concern  . Not on file  Social History Narrative  . Not on file     Constitutional: Denies fever, malaise, fatigue, headache or abrupt weight changes.  Gastrointestinal: Pt reports nausea, abdominal pain..  Denies bloating, diarrhea, constipation, or blood in the stool.  GU: Denies urgency, frequency, pain with urination, burning sensation, blood in urine, odor or discharge.   No other specific complaints in a complete review of systems (except as listed in HPI above).  Objective:  Physical Exam  BP 124/80   Pulse 82   Temp 98.2 F (36.8 C) (Oral)   Wt 240 lb (108.9 kg)   SpO2 96%   BMI 33.47 kg/m  Wt Readings from Last 3 Encounters:  04/26/17 240 lb (108.9 kg)  02/27/17 244 lb (110.7 kg)  12/01/16 237 lb (107.5 kg)    General: Appears his stated age, obese in NAD. Abdomen: Soft and mildly tender in the epigastric region. Hypoactive bowel sounds. No distention or masses noted. Marland Kitchen   BMET    Component Value Date/Time   NA 140 02/17/2017 0956   K 3.8 02/17/2017 0956   CL 103 02/17/2017 0956   CO2 26 02/17/2017 0956   GLUCOSE 89 02/17/2017 0956   BUN 16 02/17/2017 0956   CREATININE 1.24 02/17/2017 0956   CALCIUM 9.6 02/17/2017 0956   GFRNONAA 76 02/17/2017 0956   GFRAA 88 02/17/2017 0956     Lipid Panel     Component Value Date/Time   CHOL 148 05/16/2016 0856   TRIG 56.0 05/16/2016 0856   HDL 37.90 (L) 05/16/2016 0856   CHOLHDL 4 05/16/2016 0856   VLDL 11.2 05/16/2016 0856   LDLCALC 99 05/16/2016 0856    CBC    Component Value Date/Time   WBC 11.0 (H) 02/17/2017 0956   RBC 4.03 (L) 02/17/2017 0956   HGB 12.4 (L) 02/17/2017 0956   HCT 36.0 (L) 02/17/2017 0956   PLT 192 02/17/2017 0956   MCV 89.3 02/17/2017 0956   MCH 30.8 02/17/2017 0956   MCHC 34.4 02/17/2017 0956   RDW 12.5 02/17/2017 0956   LYMPHSABS 3,762 02/17/2017 0956   MONOABS 0.8 09/29/2014 1407   EOSABS 286 02/17/2017 0956   BASOSABS 44 02/17/2017 0956    Hgb A1C Lab Results  Component Value Date   HGBA1C 5.8 05/16/2016            Assessment & Plan:   Epigastric Pain:  ? Reflux Will check CBC, CMET and H Pylori today Get Prilosec OTC and start taking daily x 2 weeks Diarrhea resolved, no stool studies needed at this time  Will follow up after labs, return precautions discussed Webb Silversmith, NP

## 2017-04-26 NOTE — Patient Instructions (Signed)

## 2017-04-27 ENCOUNTER — Encounter: Payer: Self-pay | Admitting: Internal Medicine

## 2017-04-27 LAB — COMPREHENSIVE METABOLIC PANEL
ALBUMIN: 4.1 g/dL (ref 3.5–5.2)
ALK PHOS: 93 U/L (ref 39–117)
ALT: 30 U/L (ref 0–53)
AST: 30 U/L (ref 0–37)
BUN: 11 mg/dL (ref 6–23)
CALCIUM: 9.1 mg/dL (ref 8.4–10.5)
CO2: 26 mEq/L (ref 19–32)
Chloride: 107 mEq/L (ref 96–112)
Creatinine, Ser: 1.1 mg/dL (ref 0.40–1.50)
GFR: 98.71 mL/min (ref >60.00)
Glucose, Bld: 92 mg/dL (ref 70–99)
POTASSIUM: 3.6 meq/L (ref 3.5–5.1)
Sodium: 141 mEq/L (ref 135–145)
TOTAL PROTEIN: 7.3 g/dL (ref 6.0–8.3)
Total Bilirubin: 0.4 mg/dL (ref 0.2–1.2)

## 2017-04-27 LAB — CBC
HEMATOCRIT: 38.6 % — AB (ref 39.0–52.0)
HEMOGLOBIN: 12.8 g/dL — AB (ref 13.0–17.0)
MCHC: 33.1 g/dL (ref 30.0–36.0)
MCV: 93.4 fl (ref 78.0–100.0)
Platelets: 227 10*3/uL (ref 150.0–400.0)
RBC: 4.13 Mil/uL — ABNORMAL LOW (ref 4.22–5.81)
RDW: 13.4 % (ref 11.5–15.5)
WBC: 10.7 10*3/uL — AB (ref 4.0–10.5)

## 2017-04-27 LAB — H. PYLORI ANTIBODY, IGG: H PYLORI IGG: NEGATIVE

## 2017-05-03 ENCOUNTER — Other Ambulatory Visit: Payer: Self-pay | Admitting: Ophthalmology

## 2017-05-03 DIAGNOSIS — H469 Unspecified optic neuritis: Secondary | ICD-10-CM

## 2017-05-08 MED FILL — TRIUMEQ 600-50-300 MG TABS: 600-50-300 | 30 days supply | Qty: 30 | Fill #2

## 2017-05-11 ENCOUNTER — Ambulatory Visit
Admission: RE | Admit: 2017-05-11 | Discharge: 2017-05-11 | Disposition: A | Discharge status: Home / Self Care | Payer: Self-pay | Source: Ambulatory Visit | Attending: Ophthalmology | Admitting: Ophthalmology

## 2017-05-11 ENCOUNTER — Ambulatory Visit
Admission: RE | Admit: 2017-05-11 | Discharge: 2017-05-11 | Disposition: A | Discharge status: Home / Self Care | Payer: Managed Care, Other (non HMO) | Source: Ambulatory Visit | Attending: Ophthalmology | Admitting: Ophthalmology

## 2017-05-11 DIAGNOSIS — H469 Unspecified optic neuritis: Secondary | ICD-10-CM

## 2017-05-11 MED ORDER — GADOBENATE DIMEGLUMINE 529 MG/ML IV SOLN
20.0000 mL | Freq: Once | INTRAVENOUS | Status: AC | PRN
  Administered 2017-05-11: 20 mL via INTRAVENOUS

## 2017-05-12 ENCOUNTER — Ambulatory Visit
Admission: RE | Admit: 2017-05-12 | Discharge: 2017-05-12 | Disposition: A | Discharge status: Home / Self Care | Payer: Managed Care, Other (non HMO) | Source: Ambulatory Visit | Attending: Ophthalmology | Admitting: Ophthalmology

## 2017-05-12 ENCOUNTER — Other Ambulatory Visit: Payer: Self-pay | Admitting: Ophthalmology

## 2017-05-12 ENCOUNTER — Other Ambulatory Visit: Payer: Self-pay

## 2017-05-12 DIAGNOSIS — H469 Unspecified optic neuritis: Secondary | ICD-10-CM

## 2017-05-15 ENCOUNTER — Other Ambulatory Visit: Payer: Self-pay

## 2017-06-02 MED FILL — TRIUMEQ 600-50-300 MG TABS: 600-50-300 | 30 days supply | Qty: 30 | Fill #3

## 2017-06-30 MED FILL — TRIUMEQ 600-50-300 MG TABS: 600-50-300 | 30 days supply | Qty: 30 | Fill #4

## 2017-07-25 ENCOUNTER — Encounter: Payer: Self-pay | Admitting: Internal Medicine

## 2017-07-27 ENCOUNTER — Encounter: Payer: Self-pay | Admitting: Internal Medicine

## 2017-07-27 ENCOUNTER — Ambulatory Visit (INDEPENDENT_AMBULATORY_CARE_PROVIDER_SITE_OTHER): Payer: Managed Care, Other (non HMO) | Admitting: Internal Medicine

## 2017-07-27 VITALS — BP 122/78 | HR 78 | Temp 98.3°F | Ht 70.0 in | Wt 240.0 lb

## 2017-07-27 DIAGNOSIS — F419 Anxiety disorder, unspecified: Secondary | ICD-10-CM

## 2017-07-27 DIAGNOSIS — Z0001 Encounter for general adult medical examination with abnormal findings: Secondary | ICD-10-CM | POA: Diagnosis not present

## 2017-07-27 DIAGNOSIS — B20 Human immunodeficiency virus [HIV] disease: Secondary | ICD-10-CM

## 2017-07-27 DIAGNOSIS — F32A Depression, unspecified: Secondary | ICD-10-CM

## 2017-07-27 DIAGNOSIS — R0989 Other specified symptoms and signs involving the circulatory and respiratory systems: Secondary | ICD-10-CM

## 2017-07-27 DIAGNOSIS — E559 Vitamin D deficiency, unspecified: Secondary | ICD-10-CM

## 2017-07-27 DIAGNOSIS — R6889 Other general symptoms and signs: Secondary | ICD-10-CM

## 2017-07-27 DIAGNOSIS — R350 Frequency of micturition: Secondary | ICD-10-CM

## 2017-07-27 DIAGNOSIS — F329 Major depressive disorder, single episode, unspecified: Secondary | ICD-10-CM

## 2017-07-27 DIAGNOSIS — K5190 Ulcerative colitis, unspecified, without complications: Secondary | ICD-10-CM

## 2017-07-27 DIAGNOSIS — Z0000 Encounter for general adult medical examination without abnormal findings: Secondary | ICD-10-CM

## 2017-07-27 LAB — LIPID PANEL
Cholesterol: 166 mg/dL (ref 0–200)
HDL: 40.7 mg/dL (ref >39.00)
LDL Cholesterol: 111 mg/dL — ABNORMAL HIGH (ref 0–99)
NONHDL: 125.21
Total CHOL/HDL Ratio: 4
Triglycerides: 71 mg/dL (ref 0.0–149.0)
VLDL: 14.2 mg/dL (ref 0.0–40.0)

## 2017-07-27 LAB — COMPREHENSIVE METABOLIC PANEL
ALK PHOS: 105 U/L (ref 39–117)
ALT: 14 U/L (ref 0–53)
AST: 16 U/L (ref 0–37)
Albumin: 4.4 g/dL (ref 3.5–5.2)
BUN: 13 mg/dL (ref 6–23)
CO2: 29 mEq/L (ref 19–32)
CREATININE: 0.99 mg/dL (ref 0.40–1.50)
Calcium: 9.8 mg/dL (ref 8.4–10.5)
Chloride: 104 mEq/L (ref 96–112)
GFR: 111.3 mL/min (ref >60.00)
GLUCOSE: 100 mg/dL — AB (ref 70–99)
Potassium: 3.9 mEq/L (ref 3.5–5.1)
Sodium: 138 mEq/L (ref 135–145)
TOTAL PROTEIN: 7.9 g/dL (ref 6.0–8.3)
Total Bilirubin: 0.5 mg/dL (ref 0.2–1.2)

## 2017-07-27 LAB — CBC
HCT: 41.2 % (ref 39.0–52.0)
HEMOGLOBIN: 13.7 g/dL (ref 13.0–17.0)
MCHC: 33.4 g/dL (ref 30.0–36.0)
MCV: 91.1 fl (ref 78.0–100.0)
Platelets: 203 10*3/uL (ref 150.0–400.0)
RBC: 4.52 Mil/uL (ref 4.22–5.81)
RDW: 13.6 % (ref 11.5–15.5)
WBC: 10.8 10*3/uL — AB (ref 4.0–10.5)

## 2017-07-27 LAB — HEMOGLOBIN A1C: HEMOGLOBIN A1C: 5.9 % (ref 4.6–6.5)

## 2017-07-27 LAB — VITAMIN D 25 HYDROXY (VIT D DEFICIENCY, FRACTURES): VITD: 22.64 ng/mL — AB (ref 30.00–100.00)

## 2017-07-27 MED FILL — TRIUMEQ 600-50-300 MG TABS: 600-50-300 | 30 days supply | Qty: 30 | Fill #5

## 2017-07-27 NOTE — Progress Notes (Signed)
Subjective:    Patient ID: Connor [name], male    DOB: 30-Oct-1985, 32 y.o.   MRN: [id number]  HPI  Pt presents to the clinic today for his annual exam. He is also due for follow up chronic conditions.  Anxiety and Depression: Triggered by general stress and HIV status. He has failed Lexapro, Prozac, Buspar and Wellbutrin.  HIV: He is taking Triumeq as prescribed. He follows with Dr. Johnnye Sima.  Ulcerative Colitis: Diagnosed in 2007. He does not take any medications for this. He does not follow with GI.  Flu: 01/2017 Tetanus: 04/2015 Pneumovax: 04/2016 Dentist: biannually  Diet: He does eat meat. He consumes fruits and veggies daily. He does eat fried foods. He drinks mostly sweet tea, juice and soda. Exercise: None  Review of Systems      Past Medical History:  Diagnosis Date  . Anxiety   . Depression   . History of syphilis   . HIV infection ([place])   . Ulcerative colitis ([place])   . Vitamin D deficiency     Current Outpatient Medications  Medication Sig Dispense Refill  . FLUoxetine (PROZAC) 10 MG tablet Take 1 tablet (10 mg total) by mouth daily. 30 tablet 3  . methocarbamol (ROBAXIN) 500 MG tablet Take 1 tablet (500 mg total) by mouth every 8 (eight) hours as needed for muscle spasms. 20 tablet 0  . TRIUMEQ 600-50-300 MG tablet TAKE 1 TABLET BY MOUTH DAILY. 30 tablet 5   No current facility-administered medications for this visit.     No Known Allergies  Family History  Problem Relation Age of Onset  . Hypertension Mother   . Alcohol abuse Father   . Hypertension Father   . Diabetes Maternal Grandmother   . Stroke Paternal Grandfather   . Cancer Neg Hx     Social History   Socioeconomic History  . Marital status: Single    Spouse name: Not on file  . Number of children: Not on file  . Years of education: Not on file  . Highest education level: Not on file  Occupational History  . Not on file  Social Needs  . Financial resource strain: Not on file   . Food insecurity:    Worry: Not on file    Inability: Not on file  . Transportation needs:    Medical: Not on file    Non-medical: Not on file  Tobacco Use  . Smoking status: Never Smoker  . Smokeless tobacco: Never Used  Substance and Sexual Activity  . Alcohol use: No    Alcohol/week: 0.0 oz  . Drug use: No  . Sexual activity: Yes    Comment: refused condoms  Lifestyle  . Physical activity:    Days per week: Not on file    Minutes per session: Not on file  . Stress: Not on file  Relationships  . Social connections:    Talks on phone: Not on file    Gets together: Not on file    Attends religious service: Not on file    Active member of club or organization: Not on file    Attends meetings of clubs or organizations: Not on file    Relationship status: Not on file  . Intimate partner violence:    Fear of current or ex partner: Not on file    Emotionally abused: Not on file    Physically abused: Not on file    Forced sexual activity: Not on file  Other Topics Concern  .  Not on file  Social History Narrative  . Not on file     Constitutional: Pt reports fatigue. Denies fever, malaise, headache or abrupt weight changes.  HEENT: Pt reports itchy eyes, runny nose. Denies eye pain, eye redness, ear pain, ringing in the ears, wax buildup, nasal congestion, bloody nose, or sore throat. Respiratory: Denies difficulty breathing, shortness of breath, cough or sputum production.   Cardiovascular: Denies chest pain, chest tightness, palpitations or swelling in the hands or feet.  Gastrointestinal: Denies abdominal pain, bloating, constipation, diarrhea or blood in the stool.  GU: Pt reports urinary frequency. Denies urgency, pain with urination, burning sensation, blood in urine, odor or discharge. Musculoskeletal: Denies decrease in range of motion, difficulty with gait, muscle pain or joint pain and swelling.  Skin: Denies redness, rashes, lesions or ulcercations.    Neurological: Denies dizziness, difficulty with memory, difficulty with speech or problems with balance and coordination.  Psych: Pt has a history of anxiety and depression. Denies SI/HI.  No other specific complaints in a complete review of systems (except as listed in HPI above).  Objective:   Physical Exam    Wt Readings from Last 3 Encounters:  07/27/17 240 lb (108.9 kg)  04/26/17 240 lb (108.9 kg)  02/27/17 244 lb (110.7 kg)    General: Appears his stated age, obese in NAD. Skin: Warm, dry and intact.  HEENT: Head: normal shape and size; Eyes: sclera white, no icterus, conjunctiva pink, PERRLA and EOMs intact; Ears: Tm's gray and intact, normal light reflex; Throat/Mouth: Teeth present, mucosa pink and moist, no exudate, lesions or ulcerations noted.  Neck:  Neck supple, trachea midline. No masses, lumps or thyromegaly present.  Cardiovascular: Normal rate and rhythm. S1,S2 noted.  No murmur, rubs or gallops noted.  Pulmonary/Chest: Normal effort and positive vesicular breath sounds. No respiratory distress. No wheezes, rales or ronchi noted.  Abdomen: Soft and nontender. Normal bowel sounds. No distention or masses noted. Liver, spleen and kidneys non palpable. Musculoskeletal:Strength 5/5 BUE/BLE. No difficulty with gait.  Neurological: Alert and oriented. Cranial nerves II-XII grossly intact. Coordination normal.  Psychiatric: Mood and affect normal. Behavior is normal. Judgment and thought content normal.     BMET    Component Value Date/Time   NA 141 04/26/2017 1609   K 3.6 04/26/2017 1609   CL 107 04/26/2017 1609   CO2 26 04/26/2017 1609   GLUCOSE 92 04/26/2017 1609   BUN 11 04/26/2017 1609   CREATININE 1.10 04/26/2017 1609   CREATININE 1.24 02/17/2017 0956   CALCIUM 9.1 04/26/2017 1609   GFRNONAA 76 02/17/2017 0956   GFRAA 88 02/17/2017 0956    Lipid Panel     Component Value Date/Time   CHOL 148 05/16/2016 0856   TRIG 56.0 05/16/2016 0856   HDL 37.90  (L) 05/16/2016 0856   CHOLHDL 4 05/16/2016 0856   VLDL 11.2 05/16/2016 0856   LDLCALC 99 05/16/2016 0856    CBC    Component Value Date/Time   WBC 10.7 (H) 04/26/2017 1609   RBC 4.13 (L) 04/26/2017 1609   HGB 12.8 (L) 04/26/2017 1609   HCT 38.6 (L) 04/26/2017 1609   PLT 227.0 04/26/2017 1609   MCV 93.4 04/26/2017 1609   MCH 30.8 02/17/2017 0956   MCHC 33.1 04/26/2017 1609   RDW 13.4 04/26/2017 1609   LYMPHSABS 3,762 02/17/2017 0956   MONOABS 0.8 09/29/2014 1407   EOSABS 286 02/17/2017 0956   BASOSABS 44 02/17/2017 0956    Hgb A1C Lab Results  Component Value Date   HGBA1C 5.8 05/16/2016           Assessment & Plan:   Preventative Health Maintenance:  Encouraged him to get a flu shot in the fall Tetanus UTD Encouraged him to consume a balanced diet and exercise regimen Advised him to see a dentist annually Will check CBC, CMET, Lipid and Vit D today  Urinary Frequency:  Advised him to cut back on the caffeine Will check A1C today  Allergies:  Encourage him to try an antihistamine OTC along with Flonase  RTC in 1 year, sooner if needed Webb Silversmith, NP

## 2017-07-27 NOTE — Assessment & Plan Note (Signed)
Vit D today

## 2017-07-27 NOTE — Assessment & Plan Note (Signed)
He does not like the medication side effects Discussed referral for CBT or anger management- he declines at this time Will monitor

## 2017-07-27 NOTE — Assessment & Plan Note (Signed)
Continue Triumeq CMET, Lipid and A1C today He will continue to follow with Dr. Johnnye Sima

## 2017-07-27 NOTE — Patient Instructions (Signed)

## 2017-07-27 NOTE — Assessment & Plan Note (Signed)
Currently not an issue Will monitor 

## 2017-07-28 ENCOUNTER — Encounter: Payer: Self-pay | Admitting: Internal Medicine

## 2017-07-28 ENCOUNTER — Other Ambulatory Visit: Payer: Self-pay | Admitting: Internal Medicine

## 2017-07-28 DIAGNOSIS — E559 Vitamin D deficiency, unspecified: Secondary | ICD-10-CM

## 2017-07-28 MED ORDER — VITAMIN D (ERGOCALCIFEROL) 1.25 MG (50000 UNIT) PO CAPS: 50000.0000 [IU] | ORAL_CAPSULE | ORAL | 0 refills | Status: DC

## 2017-07-28 MED FILL — VIT D2 1.25 MG (50,000 UNIT: 1.25 MG | 84 days supply | Qty: 12 | Fill #0

## 2017-08-21 ENCOUNTER — Other Ambulatory Visit: Payer: Self-pay | Admitting: Infectious Diseases

## 2017-08-21 DIAGNOSIS — B20 Human immunodeficiency virus [HIV] disease: Secondary | ICD-10-CM

## 2017-08-23 MED FILL — TRIUMEQ 600-50-300 MG TABS: 600-50-300 | 30 days supply | Qty: 30 | Fill #0

## 2017-08-25 ENCOUNTER — Other Ambulatory Visit: Payer: Self-pay | Admitting: Pharmacist

## 2017-09-18 MED FILL — TRIUMEQ 600-50-300 MG TABS: 600-50-300 | 30 days supply | Qty: 30 | Fill #1

## 2017-09-22 ENCOUNTER — Encounter: Payer: Self-pay | Admitting: Internal Medicine

## 2017-10-30 MED FILL — TRIUMEQ 600-50-300 MG TABS: 600-50-300 | 30 days supply | Qty: 30 | Fill #2

## 2017-11-13 ENCOUNTER — Telehealth: Payer: Managed Care, Other (non HMO) | Admitting: Family

## 2017-11-13 DIAGNOSIS — H00014 Hordeolum externum left upper eyelid: Secondary | ICD-10-CM

## 2017-11-13 MED ORDER — NEOMYCIN-POLYMYXIN-HC 3.5-10000-1 OP SUSP: 3.0000 [drp] | OPHTHALMIC | 0 refills | Status: DC

## 2017-11-13 NOTE — Progress Notes (Signed)
We are sorry that you are not feeling well. Here is how we plan to help!  Based on what you have shared with me it looks like you have a stye.  A stye is an inflammation of the eyelid.  It is often a red, painful lump near the edge of the eyelid that may look like a boil or a pimple.  A stye develops when an infection occurs at the base of an eyelash.   We have made appropriate suggestions for you based upon your presentation:   Most styes either resolve spontaneously or resolve with simple home treatment by applying warm compresses or heated washcloth to the stye for about 10-15 minutes three to four times a day. This causes the stye to drain and resolve.    I have sent in neomycin-polymyxin HC opthalmic suspension, two to three drops in the affected eye every 4 hours.  If your symptoms do not improve over the next two to three days you should be seen in your doctor's office.  HOME CARE:   Wash your hands often!  Let the stye open on its own. Don't squeeze or open it.  Don't rub your eyes. This can irritate your eyes and let in bacteria.  If you need to touch your eyes, wash your hands first.  Don't wear eye makeup or contact lenses until the area has healed.  GET HELP RIGHT AWAY IF:   Your symptoms do not improve.  You develop blurred or loss of vision.  Your symptoms worsen (increased discharge, pain or redness).  Thank you for choosing an e-visit.  Your e-visit answers were reviewed by a board certified advanced clinical practitioner to complete your personal care plan.  Depending upon the condition, your plan could have included both over the counter or prescription medications.  Please review your pharmacy choice.  Make sure the pharmacy is open so you can pick up prescription now.  If there is a problem, you may contact your provider through [employer] and have the prescription routed to another pharmacy.    Your safety is important to [place].  If you have drug allergies  check your prescription carefully.  For the next 24 hours you can use MyChart to ask questions about today's visit, request a non-urgent call back, or ask for a work or school excuse.  You will get an email in the next two days asking about your experience.  I hope you that your e-visit has been valuable and will speed your recovery.

## 2017-11-20 ENCOUNTER — Other Ambulatory Visit: Payer: 59

## 2017-11-20 ENCOUNTER — Other Ambulatory Visit (HOSPITAL_COMMUNITY)
Admission: RE | Admit: 2017-11-20 | Discharge: 2017-11-20 | Disposition: A | Discharge status: Home / Self Care | Payer: 59 | Source: Ambulatory Visit | Attending: Infectious Diseases | Admitting: Infectious Diseases

## 2017-11-20 DIAGNOSIS — Z113 Encounter for screening for infections with a predominantly sexual mode of transmission: Secondary | ICD-10-CM | POA: Insufficient documentation

## 2017-11-20 DIAGNOSIS — B20 Human immunodeficiency virus [HIV] disease: Secondary | ICD-10-CM

## 2017-11-21 LAB — URINE CYTOLOGY ANCILLARY ONLY
CHLAMYDIA, DNA PROBE: NEGATIVE
NEISSERIA GONORRHEA: NEGATIVE

## 2017-11-21 LAB — T-HELPER CELL (CD4) - (RCID CLINIC ONLY)
CD4 % Helper T Cell: 19 % — ABNORMAL LOW (ref 33–55)
CD4 T CELL ABS: 730 /uL (ref 400–2700)

## 2017-11-22 LAB — HIV-1 RNA QUANT-NO REFLEX-BLD
HIV 1 RNA QUANT: NOT DETECTED {copies}/mL
HIV-1 RNA QUANT, LOG: NOT DETECTED {Log_copies}/mL

## 2017-11-22 LAB — FLUORESCENT TREPONEMAL AB(FTA)-IGG-BLD: Fluorescent Treponemal ABS: REACTIVE — AB

## 2017-11-22 LAB — RPR: RPR Ser Ql: REACTIVE — AB

## 2017-11-22 LAB — RPR TITER

## 2017-11-23 ENCOUNTER — Telehealth: Payer: Self-pay | Admitting: *Deleted

## 2017-11-23 MED FILL — TRIUMEQ 600-50-300 MG TABS: 600-50-300 | 30 days supply | Qty: 30 | Fill #3

## 2017-11-23 NOTE — Telephone Encounter (Signed)
-----   Message from Carlyle Basques, MD sent at 11/23/2017 11:51 AM EDT ----- He may have been re-exposed to syphilis in the last 9 months. Can we have him come in for a dose of PCN 2.4MU IM x 1

## 2017-11-23 NOTE — Telephone Encounter (Signed)
Per Baxter Flattery message called the patient to have him come in for +RPR treatment 2.4 Bicillian x 1 dose. He advised he will be here tomorrow 11/23/17 at 9 am to have it done.

## 2017-11-24 ENCOUNTER — Ambulatory Visit (INDEPENDENT_AMBULATORY_CARE_PROVIDER_SITE_OTHER): Payer: 59 | Admitting: Behavioral Health

## 2017-11-24 DIAGNOSIS — A539 Syphilis, unspecified: Secondary | ICD-10-CM | POA: Diagnosis not present

## 2017-11-24 DIAGNOSIS — A64 Unspecified sexually transmitted disease: Secondary | ICD-10-CM

## 2017-11-24 MED ORDER — PENICILLIN G BENZATHINE 1200000 UNIT/2ML IM SUSP
1.2000 10*6.[IU] | Freq: Once | INTRAMUSCULAR | Status: AC
  Administered 2017-11-24: 1.2 10*6.[IU] via INTRAMUSCULAR

## 2017-11-24 NOTE — Progress Notes (Signed)
Patient came in today to be treated for Syphilis.  Patient tolerated Bicillin injections well.  Patient accepted condoms.  Informed him to abstain from sexual activity for 10-14 days.  Also he stated he has informed partners, also states he will tell them to contact the health department to be tested and treated. Pricilla Riffle RN

## 2017-11-27 ENCOUNTER — Ambulatory Visit: Payer: 59 | Admitting: Internal Medicine

## 2017-12-04 ENCOUNTER — Ambulatory Visit (INDEPENDENT_AMBULATORY_CARE_PROVIDER_SITE_OTHER): Payer: 59 | Admitting: Infectious Diseases

## 2017-12-04 ENCOUNTER — Encounter: Payer: Self-pay | Admitting: Infectious Diseases

## 2017-12-04 VITALS — BP 132/80 | HR 86 | Temp 98.0°F | Wt 242.8 lb

## 2017-12-04 DIAGNOSIS — G4700 Insomnia, unspecified: Secondary | ICD-10-CM

## 2017-12-04 DIAGNOSIS — B20 Human immunodeficiency virus [HIV] disease: Secondary | ICD-10-CM | POA: Diagnosis not present

## 2017-12-04 DIAGNOSIS — Z23 Encounter for immunization: Secondary | ICD-10-CM

## 2017-12-04 DIAGNOSIS — K5190 Ulcerative colitis, unspecified, without complications: Secondary | ICD-10-CM

## 2017-12-04 MED ORDER — ZOLPIDEM TARTRATE 5 MG PO TABS: 5.0000 mg | ORAL_TABLET | Freq: Every evening | ORAL | 0 refills | Status: DC | PRN

## 2017-12-04 MED ORDER — DOLUTEGRAVIR-LAMIVUDINE 50-300 MG PO TABS: 1.0000 | ORAL_TABLET | Freq: Every day | ORAL | 3 refills | Status: DC

## 2017-12-04 MED FILL — DOVATO 50-300 MG TABS: 50-300 | 30 days supply | Qty: 30 | Fill #0

## 2017-12-04 NOTE — Progress Notes (Signed)
   Subjective:    Patient ID: CLAXTON LEVITZ, male    DOB: September 20, 1985, 32 y.o.   MRN: [id number]  HPI 32 yo M withhx of HIV/AIDS, was having difficulty with his ART previously ( headaches, trouble sleeping). Was changed to complera April 2013, then to triumeq January 2015 (due to food issues). Has received Hep B series x 2.  Worried triumeq is giving him headaches, make him feel bad. Wants to switch.  Was treated with bicillin 11-24-17.   HIV 1 RNA Quant (copies/mL)  Date Value  11/20/2017 <20 NOT DETECTED  02/17/2017 <20 NOT DETECTED  09/28/2015 <20   CD4 T Cell Abs (/uL)  Date Value  11/20/2017 730  02/17/2017 860  07/19/2016 840    Review of Systems  Constitutional: Negative for appetite change and unexpected weight change.  Respiratory: Negative for cough and shortness of breath.   Gastrointestinal: Negative for constipation and diarrhea.  Genitourinary: Negative for difficulty urinating.  Psychiatric/Behavioral: Positive for sleep disturbance. Negative for dysphoric mood.       Objective:   Physical Exam  Constitutional: He is oriented to person, place, and time. He appears well-developed and well-nourished.  HENT:  Mouth/Throat: No oropharyngeal exudate.  Eyes: Pupils are equal, round, and reactive to light. EOM are normal.  Neck: Normal range of motion. Neck supple.  Cardiovascular: Normal rate, regular rhythm and normal heart sounds.  Pulmonary/Chest: Effort normal and breath sounds normal.  Abdominal: Soft. Bowel sounds are normal. There is no tenderness. There is no guarding.  Musculoskeletal: He exhibits no edema.  Lymphadenopathy:    He has no cervical adenopathy.  Neurological: He is alert and oriented to person, place, and time.  Psychiatric: He has a normal mood and affect.          Assessment & Plan:

## 2017-12-04 NOTE — Assessment & Plan Note (Signed)
Quiescent

## 2017-12-04 NOTE — Addendum Note (Signed)
Addended by: Eugenia Mcalpine on: 12/04/2017 10:45 AM   Modules accepted: Orders

## 2017-12-04 NOTE — Assessment & Plan Note (Signed)
Encouraged to keep exercising. Given condoms Will change art to dovato Will see him back in 3-4 months PCV 13 today.

## 2017-12-04 NOTE — Assessment & Plan Note (Signed)
Will give him short term rx for ambien, he has taken before.

## 2017-12-12 MED FILL — ZOLPIDEM TARTRATE 5 MG TAB: 5 | 21 days supply | Qty: 21 | Fill #0

## 2018-01-11 MED FILL — DOVATO 50-300 MG TABS: 50-300 | 30 days supply | Qty: 30 | Fill #1

## 2018-01-25 ENCOUNTER — Encounter: Payer: Self-pay | Admitting: Internal Medicine

## 2018-01-29 ENCOUNTER — Other Ambulatory Visit (INDEPENDENT_AMBULATORY_CARE_PROVIDER_SITE_OTHER): Payer: 59

## 2018-01-29 DIAGNOSIS — E559 Vitamin D deficiency, unspecified: Secondary | ICD-10-CM

## 2018-01-29 LAB — VITAMIN D 25 HYDROXY (VIT D DEFICIENCY, FRACTURES): VITD: 24.77 ng/mL — AB (ref 30.00–100.00)

## 2018-01-31 ENCOUNTER — Other Ambulatory Visit: Payer: Self-pay | Admitting: Internal Medicine

## 2018-01-31 DIAGNOSIS — E559 Vitamin D deficiency, unspecified: Secondary | ICD-10-CM

## 2018-01-31 MED ORDER — VITAMIN D (ERGOCALCIFEROL) 1.25 MG (50000 UNIT) PO CAPS: 50000.0000 [IU] | ORAL_CAPSULE | ORAL | 0 refills | Status: DC

## 2018-01-31 MED FILL — VIT D2 1.25 MG (50,000 UNIT: 1.25 MG | 84 days supply | Qty: 12 | Fill #0

## 2018-02-05 MED FILL — DOVATO 50-300 MG TABS: 50-300 | 30 days supply | Qty: 30 | Fill #2

## 2018-02-12 ENCOUNTER — Encounter: Payer: Self-pay | Admitting: Internal Medicine

## 2018-02-16 ENCOUNTER — Ambulatory Visit: Payer: Self-pay | Admitting: Family Medicine

## 2018-02-16 VITALS — BP 125/80 | HR 96 | Temp 98.3°F | Resp 18

## 2018-02-16 DIAGNOSIS — L0390 Cellulitis, unspecified: Secondary | ICD-10-CM

## 2018-02-16 MED ORDER — DOXYCYCLINE HYCLATE 100 MG PO TABS: 100.0000 mg | ORAL_TABLET | Freq: Two times a day (BID) | ORAL | 0 refills | Status: AC

## 2018-02-16 MED FILL — DOXYCYCLINE HYCLATE 100 MG: 100 | 7 days supply | Qty: 14 | Fill #0

## 2018-02-16 NOTE — Progress Notes (Signed)
Connor [name] is a 32 y.o. male who presents today with concerns of single lesion located near his groin area. The patient described this area as an abscess that occur in different locations chronically. He reports that he has been under the care of dermatology for a similar like condition and is not on any chronic daily medication. He does report that he has been treated with a minor procedure of lancing the area and topical/oral antibiotics in the past. He reports that both treatments have resolved his symptoms in the past.  Review of Systems  Constitutional: Negative for chills, fever and malaise/fatigue.  HENT: Negative for congestion, ear discharge, ear pain, sinus pain and sore throat.   Eyes: Negative.   Respiratory: Negative for cough, sputum production and shortness of breath.   Cardiovascular: Negative.  Negative for chest pain.  Gastrointestinal: Negative for abdominal pain, diarrhea, nausea and vomiting.  Genitourinary: Negative for dysuria, frequency, hematuria and urgency.  Musculoskeletal: Negative for myalgias.  Skin: Negative.        Single skin lesion  Neurological: Negative for headaches.  Endo/Heme/Allergies: Negative.   Psychiatric/Behavioral: Negative.     O: Vitals:   02/16/18 1050  BP: 125/80  Pulse: 96  Resp: 18  Temp: 98.3 F (36.8 C)  SpO2: 96%     Physical Exam  Constitutional: He is oriented to person, place, and time. Vital signs are normal. He appears well-developed and well-nourished. He is active.  Non-toxic appearance. He does not have a sickly appearance.  HENT:  Head: Normocephalic.  Right Ear: Hearing, tympanic membrane, external ear and ear canal normal.  Left Ear: Hearing, tympanic membrane, external ear and ear canal normal.  Nose: Nose normal.  Mouth/Throat: Uvula is midline and oropharynx is clear and moist.  Neck: Normal range of motion. Neck supple.  Cardiovascular: Normal rate, regular rhythm, normal heart sounds and normal  pulses.  Pulmonary/Chest: Effort normal and breath sounds normal.  Abdominal: Soft. Bowel sounds are normal.  Musculoskeletal: Normal range of motion.  Lymphadenopathy:       Head (right side): No submental and no submandibular adenopathy present.       Head (left side): No submental and no submandibular adenopathy present.    He has no cervical adenopathy.  Neurological: He is alert and oriented to person, place, and time.  Skin: Lesion and rash noted. No ecchymosis, no laceration, no petechiae and no purpura noted. Rash is maculopapular. Rash is not macular, not nodular, not pustular, not vesicular and not urticarial. There is erythema.     Flat flush with the skin- some evidence of central clearing and scab formation and removal- non fluctuant mild erythema.  Psychiatric: He has a normal mood and affect.  Vitals reviewed.  A: 1. Cellulitis, unspecified cellulitis site    P: Discussed xam findings, diagnosis etiology and medication use and indications reviewed with patient. Follow- Up and discharge instructions provided. No emergent/urgent issues found on exam.  Patient verbalized understanding of information provided and agrees with plan of care (POC), all questions answered.  1. Cellulitis, unspecified cellulitis site - doxycycline (VIBRA-TABS) 100 MG tablet; Take 1 tablet (100 mg total) by mouth 2 (two) times daily for 7 days.  Other orders - Multiple Vitamin (MULTIVITAMIN) capsule; Take 1 capsule by mouth daily.  Right groin

## 2018-02-16 NOTE — Patient Instructions (Signed)

## 2018-02-20 ENCOUNTER — Other Ambulatory Visit: Payer: 59

## 2018-02-25 IMAGING — MR MR ORBITS WO/W CM
16 of 18 series · 33 of 48 positions shown · IV contrast (multihance)
Comparison: None.

CLINICAL DATA: Optic neuritis



[Series 2: T1 · sagittal · 5.0mm · 0.45mm/px · 1 of 19 slices shown (1 of 3)]
[im 1/19]
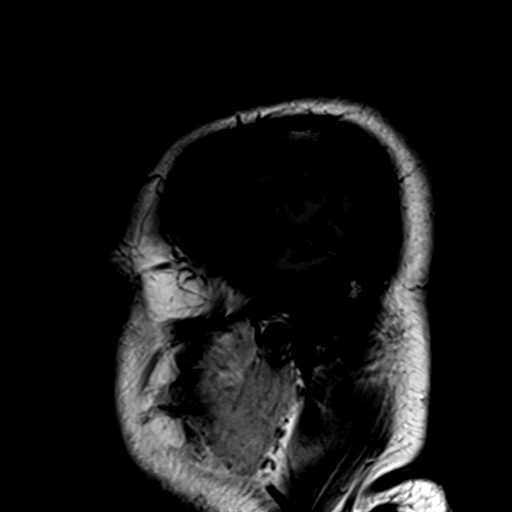

[Series 3: DWI · axial · 3.0mm · 1.80mm/px · z∈[-35,+99]mm · 5 of 92 slices shown (1 of 2)]
[im 1/92]
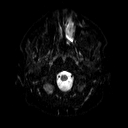
[im 23/92]
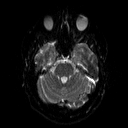
[im 46/92]
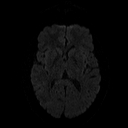
[im 69/92]
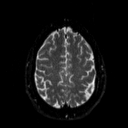
[im 92/92]
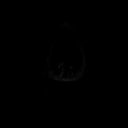

[Series 4: DWI · axial · 3.0mm · 1.80mm/px · z∈[-35,+99]mm · 3 of 46 slices shown (2 of 2)]
[im 1/46]
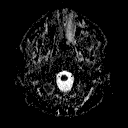
[im 23/46]
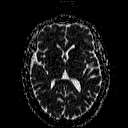
[im 46/46]
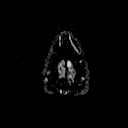

[Series 5: T2 · axial · 5.0mm · 0.51mm/px · z∈[-38,+103]mm · 2 of 22 slices shown (1 of 2)]
[im 1/22]
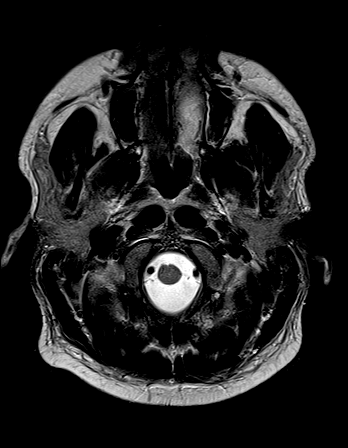
[im 22/22]
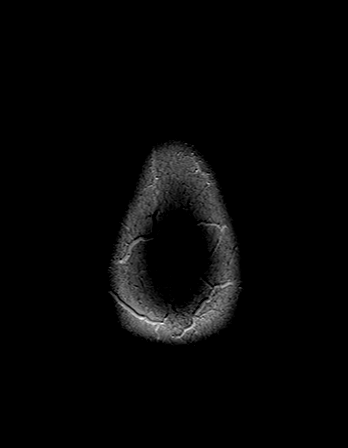

[Series 6: mip_images(sw) · axial · 16.0mm · 0.98mm/px · z∈[-42,+101]mm · 5 of 73 slices shown]
[im 1/73]
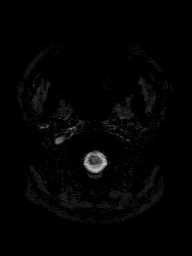
[im 19/73]
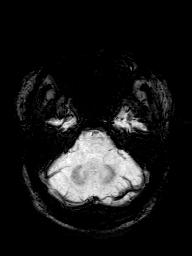
[im 37/73]
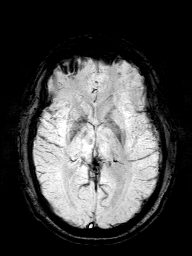
[im 55/73]
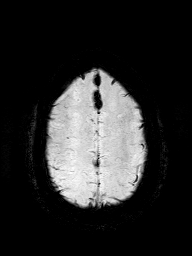
[im 73/73]
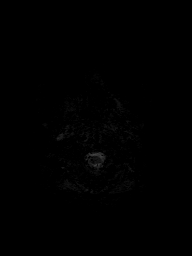

[Series 7: swi_images · axial · 2.0mm · 0.98mm/px · 1 of 80 slices shown]
[im 1/80]
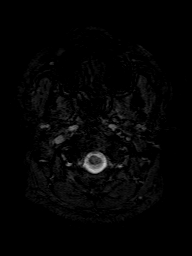

[Series 8: FLAIR · axial · 3.0mm · 0.49mm/px · z∈[-41,+103]mm · 2 of 32 slices shown (1 of 2)]
[im 1/32]
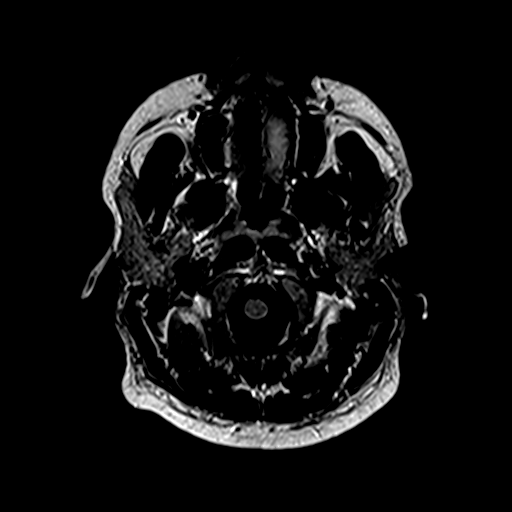
[im 32/32]
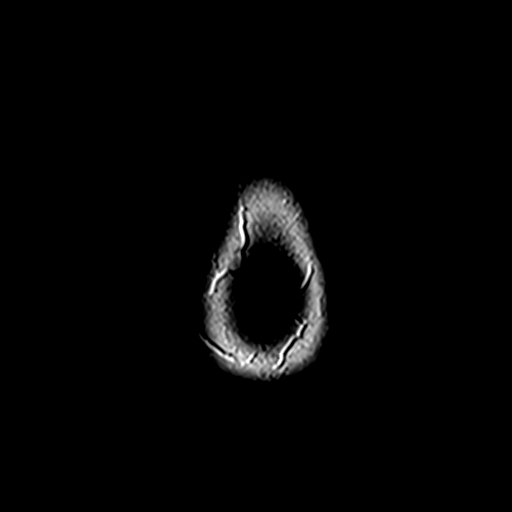

[Series 10: T2 · coronal · 5.0mm · 0.45mm/px · 2 of 23 slices shown (2 of 2)]
[im 1/23]
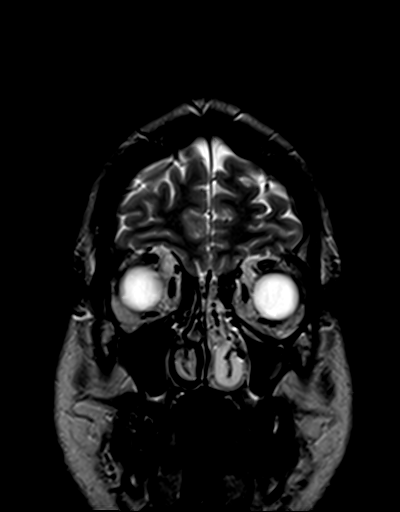
[im 23/23]
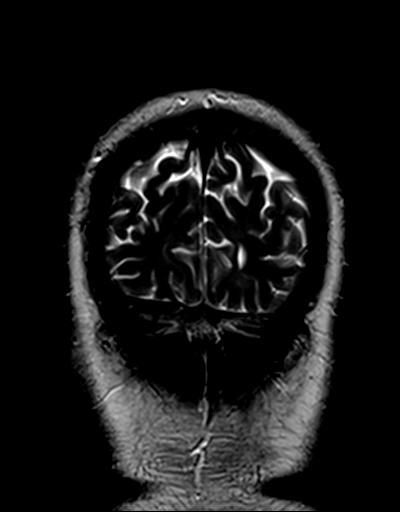

[Series 11: FLAIR · sagittal · 5.0mm · 0.49mm/px · 1 of 19 slices shown (2 of 2)]
[im 1/19]
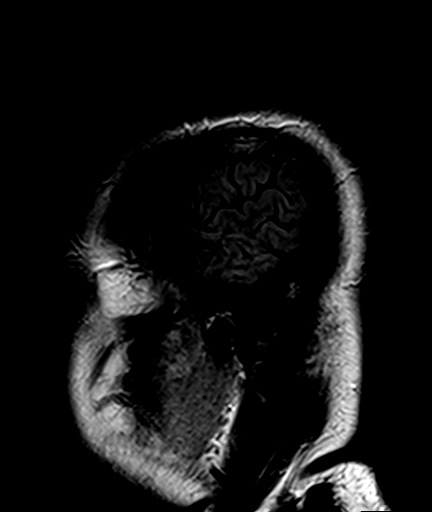

[Series 12: T1 · coronal · 3.0mm · 0.35mm/px · 2 of 24 slices shown (2 of 3)]
[im 1/24]
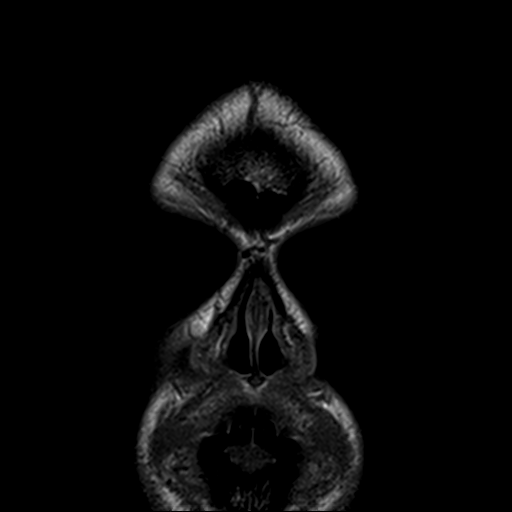
[im 24/24]
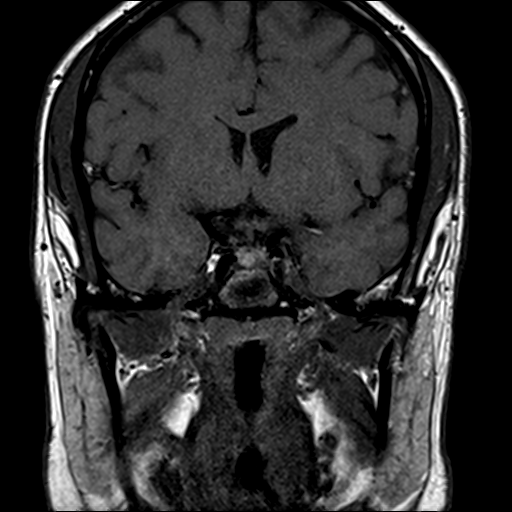

[Series 13: T1 · axial · 3.0mm · 0.35mm/px · 1 of 15 slices shown (3 of 3)]
[im 1/15]
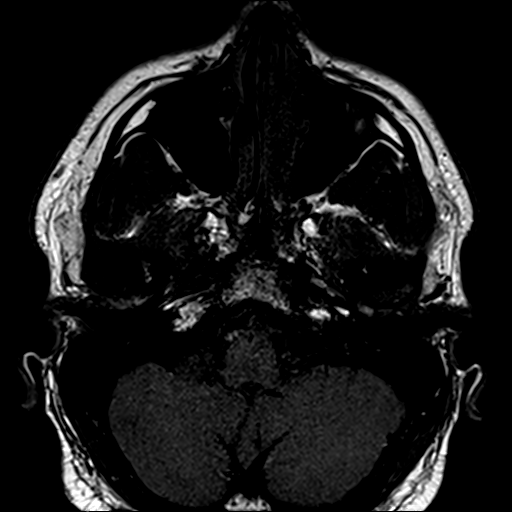

[Series 14: T2 fat-sat · coronal · 3.0mm · 0.35mm/px · 2 of 24 slices shown (1 of 2)]
[im 1/24]
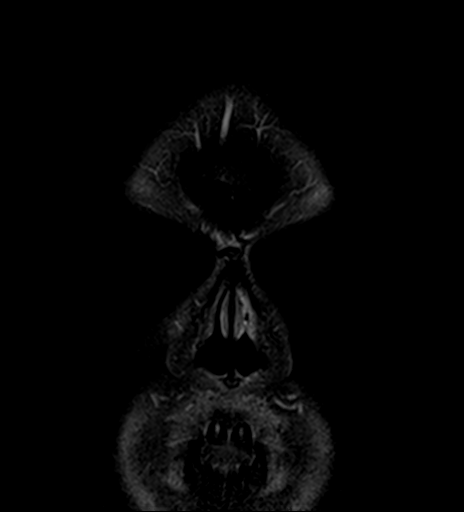
[im 24/24]
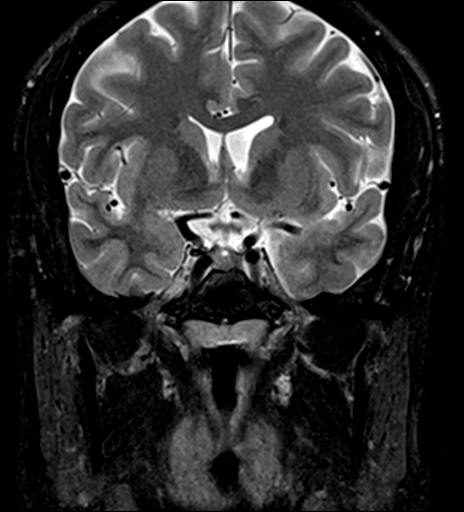

[Series 15: T2 fat-sat · axial · 3.0mm · 0.35mm/px · 1 of 15 slices shown (2 of 2)]
[im 1/15]
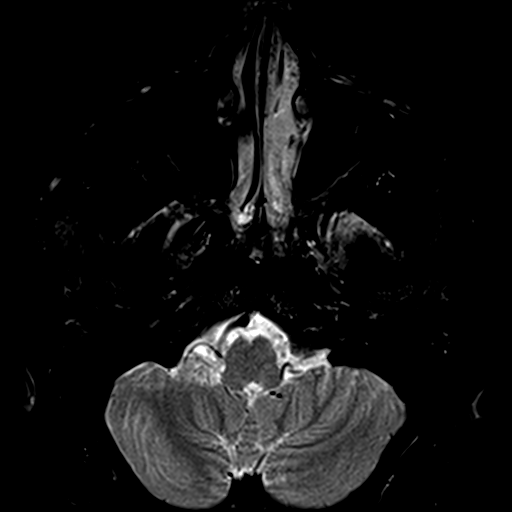

[Series 17: T1 post-contrast · coronal · 5.0mm · 0.45mm/px · 2 of 23 slices shown]
[im 1/23]
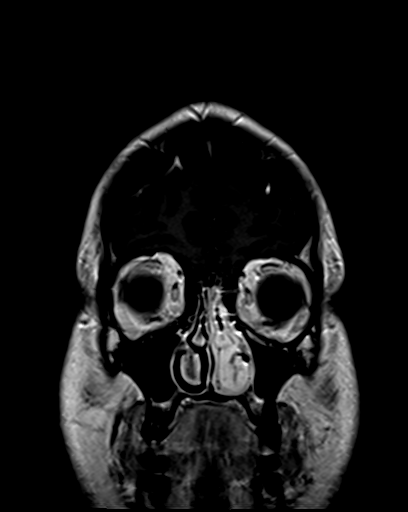
[im 23/23]
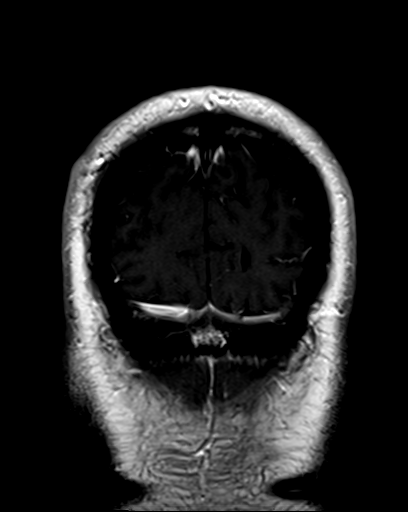

[Series 18: T1 fat-sat post-contrast · coronal · 3.0mm · 0.35mm/px · 2 of 26 slices shown (1 of 2)]
[im 1/26]
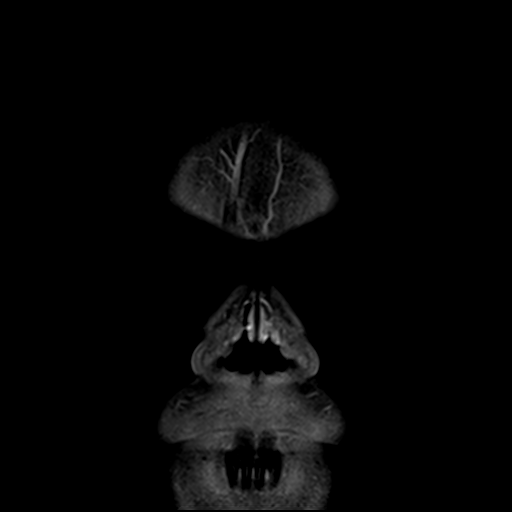
[im 26/26]
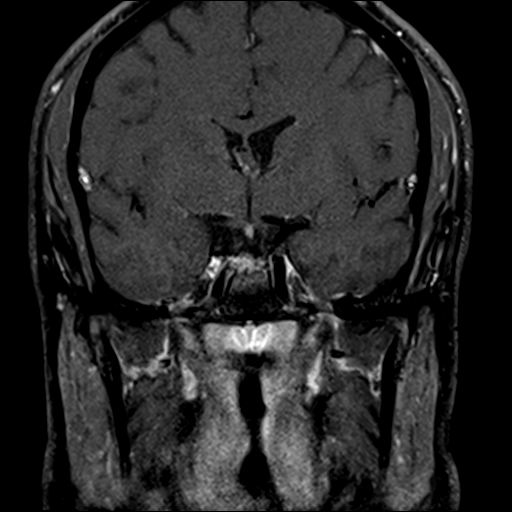

[Series 19: T1 fat-sat post-contrast · axial · 3.0mm · 0.35mm/px · 1 of 15 slices shown (2 of 2)]
[im 1/15]
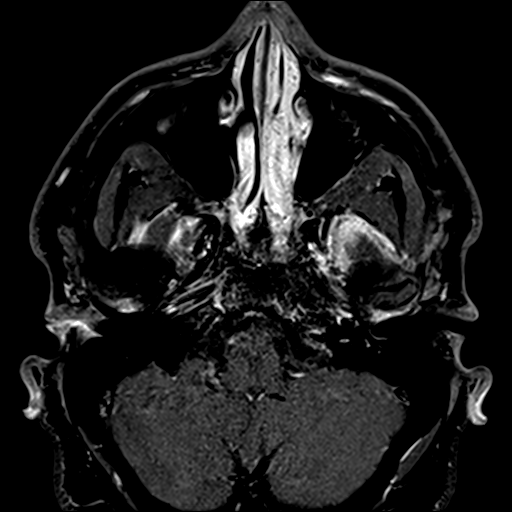

[33 of 48 positions shown; findings below may reference images not displayed]

FINDINGS: MRI HEAD FINDINGS

Brain: No acute infarction, hemorrhage, hydrocephalus, extra-axial
collection or mass lesion.

Small white matter hyperintensities in the deep white matter
bilaterally, nonspecific. Brainstem and cerebellum normal.

Vascular: Normal arterial flow void

Skull and upper cervical spine: Negative

Other: None

MRI ORBITS FINDINGS

Orbits: Globe is symmetric and normal in size and signal. Orbital
fat is normal bilaterally. Muscles are normal. Optic nerve normal in
signal without abnormal enhancement. No orbital mass lesion. Optic
chiasm normal. Pituitary not enlarged. Cavernous sinus normal
bilaterally.

Visualized sinuses: Mild mucosal edema in the paranasal sinuses
without air-fluid level

Soft tissues: Normal soft tissues of the face
IMPRESSION: Small deep white matter hyperintensities bilaterally are nonspecific
but not typical for multiple sclerosis. Possible etiologies include
complex migraine headaches, vasculitis, and microvascular chronic
ischemia.

Negative MRI orbit. Normal optic chiasm and optic nerves
bilaterally.

## 2018-03-06 ENCOUNTER — Encounter: Payer: Self-pay | Admitting: Infectious Diseases

## 2018-03-06 ENCOUNTER — Ambulatory Visit (INDEPENDENT_AMBULATORY_CARE_PROVIDER_SITE_OTHER): Payer: 59 | Admitting: Infectious Diseases

## 2018-03-06 VITALS — BP 130/80 | HR 84 | Temp 98.0°F | Wt 250.8 lb

## 2018-03-06 DIAGNOSIS — E669 Obesity, unspecified: Secondary | ICD-10-CM | POA: Diagnosis not present

## 2018-03-06 DIAGNOSIS — K5130 Ulcerative (chronic) rectosigmoiditis without complications: Secondary | ICD-10-CM

## 2018-03-06 DIAGNOSIS — B20 Human immunodeficiency virus [HIV] disease: Secondary | ICD-10-CM

## 2018-03-06 DIAGNOSIS — L03116 Cellulitis of left lower limb: Secondary | ICD-10-CM | POA: Diagnosis not present

## 2018-03-06 DIAGNOSIS — Z113 Encounter for screening for infections with a predominantly sexual mode of transmission: Secondary | ICD-10-CM

## 2018-03-06 DIAGNOSIS — L0390 Cellulitis, unspecified: Secondary | ICD-10-CM | POA: Insufficient documentation

## 2018-03-06 DIAGNOSIS — Z79899 Other long term (current) drug therapy: Secondary | ICD-10-CM

## 2018-03-06 DIAGNOSIS — A539 Syphilis, unspecified: Secondary | ICD-10-CM

## 2018-03-06 NOTE — Progress Notes (Signed)
   Subjective:    Patient ID: Connor [name], male    DOB: Nov 21, 1985, 32 y.o.   MRN: [id number]  HPI 32 yo M withhx of HIV/AIDS, was having difficulty with his ART previously ( headaches, trouble sleeping). Was changed to complera April 2013, then to triumeq January 2015 (due to food issues). Has received Hep B series x 2. At his f/u 11-2017, he disliked the triumeq (headaches). He was changed to dovato. He has taken this sparingly. Today feels sleepy, feels like ART makes him more tired. When he quits taking, he feels fine. Same if he takes rx at [facility], tired til 2pm next day.    HIV 1 RNA Quant (copies/mL)  Date Value  11/20/2017 <20 NOT DETECTED  02/17/2017 <20 NOT DETECTED  09/28/2015 <20   CD4 T Cell Abs (/uL)  Date Value  11/20/2017 730  02/17/2017 860  07/19/2016 840    Review of Systems  Constitutional: Positive for fatigue. Negative for appetite change and unexpected weight change.  Gastrointestinal: Negative for constipation and diarrhea.  Genitourinary: Negative for difficulty urinating.  Please see HPI. All other systems reviewed and negative.      Objective:   Physical Exam  Constitutional: He is oriented to person, place, and time. He appears well-developed and well-nourished.  HENT:  Mouth/Throat: No oropharyngeal exudate.  Eyes: Pupils are equal, round, and reactive to light. EOM are normal.  Neck: Normal range of motion. Neck supple.  Cardiovascular: Normal rate, regular rhythm and normal heart sounds.  Pulmonary/Chest: Effort normal and breath sounds normal.  Abdominal: Soft. Bowel sounds are normal. There is no tenderness. There is no guarding.  Musculoskeletal: He exhibits no edema.  Lymphadenopathy:    He has no cervical adenopathy.  Neurological: He is alert and oriented to person, place, and time.  Psychiatric: He has a normal mood and affect.       Assessment & Plan:

## 2018-03-06 NOTE — Assessment & Plan Note (Signed)
No brbpr, no diarrhea Has been doing well.

## 2018-03-06 NOTE — Assessment & Plan Note (Signed)
Reminded him that his weight has increased.  Will continue to encourage him to watch his wt, diet and exercise

## 2018-03-06 NOTE — Assessment & Plan Note (Signed)
He agrees to continue on dovato to see if he will tolerate better He has gotten flu and pcv Will see him back in 3 months.  Offered/refused condoms.

## 2018-03-06 NOTE — Assessment & Plan Note (Signed)
His RPR went up 1 fold.  Will continue to watch.

## 2018-03-06 NOTE — Assessment & Plan Note (Addendum)
This is currently quiescent but he states he has had multiple prev episodes.  Will ask that he uses anbx soap , CHG baths.

## 2018-03-27 MED FILL — DOVATO 50-300 MG TABS: 50-300 | 30 days supply | Qty: 30 | Fill #3

## 2018-04-22 ENCOUNTER — Telehealth: Payer: 59 | Admitting: Family

## 2018-04-22 DIAGNOSIS — R1010 Upper abdominal pain, unspecified: Secondary | ICD-10-CM

## 2018-04-22 DIAGNOSIS — R197 Diarrhea, unspecified: Secondary | ICD-10-CM

## 2018-04-22 NOTE — Progress Notes (Signed)
Based on what you shared with me it looks like you have a serious condition that should be evaluated in a face to face office visit.  NOTE: If you entered your credit card information for this eVisit, you will not be charged. You may see a "hold" on your card for the $30 but that hold will drop off and you will not have a charge processed.  If you are having a true medical emergency please call 911.  If you need an urgent face to face visit, [facility] has four urgent care centers for your convenience.  If you need care fast and have a high deductible or no insurance consider:   [email or web address] to reserve your spot online an avoid wait times  [facility] [address], [address] [place], [place] [address] 8 am to 8 pm Monday-Friday 10 am to 4 pm Saturday-Sunday *Across the street from [employer]  [facility]  [address] [place], [address] 8 am to 5 pm Monday-Friday * In the [facility]   The following sites will take your  insurance:  . [facility]  [phone] Get Driving Directions Find a Provider at this Location  [address] [place], [place] [address] . 10 am to 8 pm Monday-Friday . 12 pm to 8 pm Saturday-Sunday   . [facility]  [phone] Get Driving Directions Find a Provider at this Location  [address], [address] [place], [place] [address] . 8 am to 8 pm Monday-Friday . 9 am to 6 pm Saturday . 11 am to 6 pm Sunday   . [facility] [place]  [phone] Get Driving Directions  [address] [place], [place] [address] . 8 am to 8 pm Monday-Friday . 8 am to 4 pm Saturday-Sunday   Your e-visit answers were reviewed by a board certified advanced clinical practitioner to complete your personal care plan.  Thank you for using e-Visits.  

## 2018-04-24 ENCOUNTER — Ambulatory Visit (INDEPENDENT_AMBULATORY_CARE_PROVIDER_SITE_OTHER): Payer: 59 | Admitting: Internal Medicine

## 2018-04-24 ENCOUNTER — Encounter: Payer: Self-pay | Admitting: Internal Medicine

## 2018-04-24 VITALS — BP 122/78 | HR 84 | Temp 98.6°F | Ht 70.0 in | Wt 248.5 lb

## 2018-04-24 DIAGNOSIS — R197 Diarrhea, unspecified: Secondary | ICD-10-CM | POA: Diagnosis not present

## 2018-04-24 MED ORDER — DIPHENOXYLATE-ATROPINE 2.5-0.025 MG PO TABS: 1.0000 | ORAL_TABLET | Freq: Four times a day (QID) | ORAL | 0 refills | Status: DC | PRN

## 2018-04-24 MED FILL — DIPHENOXYLATE-ATROPINE 2.5-: 2.5-0.025 | 7 days supply | Qty: 30 | Fill #0

## 2018-04-24 NOTE — Addendum Note (Signed)
Addended by: Ellamae Sia on: 04/24/2018 10:30 AM   Modules accepted: Orders

## 2018-04-24 NOTE — Progress Notes (Signed)
Subjective:    Patient ID: Connor [name], male    DOB: December 30, 1985, 33 y.o.   MRN: [id number]  HPI  Pt presents to the clinic today with c/o diarrhea. He reports this started 1 week ago. He denies abdominal pain, vomiting or blood in his stools. He denies fever, chills or body aches. He denies recent antibiotic use. He denies recent travel. He denies recent changes in diet or medications. He has been under a little stress lately.  He does have a history of ulcerative colitis but has not had any issues since 2005.  Review of Systems  Past Medical History:  Diagnosis Date  . Anxiety   . Depression   . History of syphilis   . HIV infection ([place])   . Ulcerative colitis ([place])   . Vitamin D deficiency     Current Outpatient Medications  Medication Sig Dispense Refill  . Dolutegravir-lamiVUDine (DOVATO) 50-300 MG TABS Take 1 tablet by mouth daily. 90 tablet 3  . Multiple Vitamin (MULTIVITAMIN) capsule Take 1 capsule by mouth daily.    Marland Kitchen neomycin-polymyxin-hydrocortisone (CORTISPORIN) 3.5-10000-1 ophthalmic suspension Place 3 drops into the left eye every 4 (four) hours. (Patient not taking: Reported on 12/04/2017) 7.5 mL 0  . Vitamin D, Ergocalciferol, (DRISDOL) 50000 units CAPS capsule Take 1 capsule (50,000 Units total) by mouth every 7 (seven) days. 12 capsule 0  . zolpidem (AMBIEN) 5 MG tablet Take 1 tablet (5 mg total) by mouth at bedtime as needed for sleep. (Patient not taking: Reported on 02/16/2018) 21 tablet 0   No current facility-administered medications for this visit.     No Known Allergies  Family History  Problem Relation Age of Onset  . Hypertension Mother   . Alcohol abuse Father   . Hypertension Father   . Diabetes Maternal Grandmother   . Stroke Paternal Grandfather   . Cancer Neg Hx     Social History   Socioeconomic History  . Marital status: Single    Spouse name: Not on file  . Number of children: Not on file  . Years of education: Not on file  .  Highest education level: Not on file  Occupational History  . Not on file  Social Needs  . Financial resource strain: Not on file  . Food insecurity:    Worry: Not on file    Inability: Not on file  . Transportation needs:    Medical: Not on file    Non-medical: Not on file  Tobacco Use  . Smoking status: Never Smoker  . Smokeless tobacco: Never Used  Substance and Sexual Activity  . Alcohol use: No    Alcohol/week: 0.0 standard drinks  . Drug use: No  . Sexual activity: Yes    Comment: refused condoms  Lifestyle  . Physical activity:    Days per week: Not on file    Minutes per session: Not on file  . Stress: Not on file  Relationships  . Social connections:    Talks on phone: Not on file    Gets together: Not on file    Attends religious service: Not on file    Active member of club or organization: Not on file    Attends meetings of clubs or organizations: Not on file    Relationship status: Not on file  . Intimate partner violence:    Fear of current or ex partner: Not on file    Emotionally abused: Not on file    Physically abused: Not  on file    Forced sexual activity: Not on file  Other Topics Concern  . Not on file  Social History Narrative  . Not on file     Constitutional: Denies fever, malaise, fatigue, headache or abrupt weight changes.  Respiratory: Denies difficulty breathing, shortness of breath, cough or sputum production.   Cardiovascular: Denies chest pain, chest tightness, palpitations or swelling in the hands or feet.  Gastrointestinal: Pt reports diarrhea. Denies abdominal pain, bloating, constipation,  or blood in the stool.   Psych: Pt reports stress. Denies anxiety, depression, SI/HI.  No other specific complaints in a complete review of systems (except as listed in HPI above).     Objective:   Physical Exam   BP 122/78 (BP Location: Left Arm, Patient Position: Sitting, Cuff Size: Large)   Pulse 84   Temp 98.6 F (37 C) (Oral)   Ht  5' 10"  (1.778 m)   Wt 248 lb 8 oz (112.7 kg)   SpO2 96%   BMI 35.66 kg/m  Wt Readings from Last 3 Encounters:  04/24/18 248 lb 8 oz (112.7 kg)  03/06/18 250 lb 12.8 oz (113.8 kg)  12/04/17 242 lb 12.8 oz (110.1 kg)    General: Appears his stated age, obese in NAD. Cardiovascular: Normal rate and rhythm. S1,S2 noted.  No murmur, rubs or gallops noted.  Pulmonary/Chest: Normal effort and positive vesicular breath sounds. No respiratory distress. No wheezes, rales or ronchi noted.  Abdomen: Soft and nontender. Normal bowel sounds. No distention or masses noted.  Neurological: Alert and oriented.    BMET    Component Value Date/Time   NA 138 07/27/2017 1457   K 3.9 07/27/2017 1457   CL 104 07/27/2017 1457   CO2 29 07/27/2017 1457   GLUCOSE 100 (H) 07/27/2017 1457   BUN 13 07/27/2017 1457   CREATININE 0.99 07/27/2017 1457   CREATININE 1.24 02/17/2017 0956   CALCIUM 9.8 07/27/2017 1457   GFRNONAA 76 02/17/2017 0956   GFRAA 88 02/17/2017 0956    Lipid Panel     Component Value Date/Time   CHOL 166 07/27/2017 1457   TRIG 71.0 07/27/2017 1457   HDL 40.70 07/27/2017 1457   CHOLHDL 4 07/27/2017 1457   VLDL 14.2 07/27/2017 1457   LDLCALC 111 (H) 07/27/2017 1457    CBC    Component Value Date/Time   WBC 10.8 (H) 07/27/2017 1457   RBC 4.52 07/27/2017 1457   HGB 13.7 07/27/2017 1457   HCT 41.2 07/27/2017 1457   PLT 203.0 07/27/2017 1457   MCV 91.1 07/27/2017 1457   MCH 30.8 02/17/2017 0956   MCHC 33.4 07/27/2017 1457   RDW 13.6 07/27/2017 1457   LYMPHSABS 3,762 02/17/2017 0956   MONOABS 0.8 09/29/2014 1407   EOSABS 286 02/17/2017 0956   BASOSABS 44 02/17/2017 0956    Hgb A1C Lab Results  Component Value Date   HGBA1C 5.9 07/27/2017           Assessment & Plan:   Diarrhea:  Encouraged bland diet, advance as tolerated Does not appear to be a UC flare RX for Imodium- use as directed Will obtain GI Pathogen panel today  Return precautions  discussed Webb Silversmith, NP

## 2018-04-24 NOTE — Patient Instructions (Signed)

## 2018-04-25 DIAGNOSIS — R197 Diarrhea, unspecified: Secondary | ICD-10-CM | POA: Diagnosis not present

## 2018-04-25 NOTE — Addendum Note (Signed)
Addended by: Ellamae Sia on: 04/25/2018 09:17 AM   Modules accepted: Orders

## 2018-04-26 LAB — GASTROINTESTINAL PATHOGEN PANEL PCR
C. difficile Tox A/B, PCR: NOT DETECTED
CAMPYLOBACTER, PCR: NOT DETECTED
Cryptosporidium, PCR: NOT DETECTED
E coli (ETEC) LT/ST PCR: NOT DETECTED
E coli (STEC) stx1/stx2, PCR: NOT DETECTED
E coli 0157, PCR: NOT DETECTED
GIARDIA LAMBLIA, PCR: NOT DETECTED
Norovirus, PCR: NOT DETECTED
ROTAVIRUS, PCR: NOT DETECTED
SALMONELLA, PCR: NOT DETECTED
SHIGELLA, PCR: NOT DETECTED

## 2018-05-02 ENCOUNTER — Other Ambulatory Visit: Payer: Self-pay | Admitting: Infectious Diseases

## 2018-05-02 DIAGNOSIS — B20 Human immunodeficiency virus [HIV] disease: Secondary | ICD-10-CM

## 2018-05-02 MED ORDER — ABACAVIR-DOLUTEGRAVIR-LAMIVUD 600-50-300 MG PO TABS: 1.0000 | ORAL_TABLET | Freq: Every day | ORAL | 3 refills | Status: DC

## 2018-05-30 MED FILL — TRIUMEQ 600-50-300 MG TABS: 600-50-300 | 30 days supply | Qty: 30 | Fill #0

## 2018-06-01 ENCOUNTER — Encounter: Payer: Self-pay | Admitting: Infectious Diseases

## 2018-06-01 ENCOUNTER — Ambulatory Visit (INDEPENDENT_AMBULATORY_CARE_PROVIDER_SITE_OTHER): Payer: 59 | Admitting: Infectious Diseases

## 2018-06-01 VITALS — BP 136/79 | HR 77 | Temp 98.1°F | Wt 248.0 lb

## 2018-06-01 DIAGNOSIS — E669 Obesity, unspecified: Secondary | ICD-10-CM | POA: Insufficient documentation

## 2018-06-01 DIAGNOSIS — B20 Human immunodeficiency virus [HIV] disease: Secondary | ICD-10-CM | POA: Diagnosis not present

## 2018-06-01 DIAGNOSIS — F419 Anxiety disorder, unspecified: Secondary | ICD-10-CM | POA: Diagnosis not present

## 2018-06-01 DIAGNOSIS — Z113 Encounter for screening for infections with a predominantly sexual mode of transmission: Secondary | ICD-10-CM | POA: Diagnosis not present

## 2018-06-01 DIAGNOSIS — Z79899 Other long term (current) drug therapy: Secondary | ICD-10-CM

## 2018-06-01 DIAGNOSIS — F329 Major depressive disorder, single episode, unspecified: Secondary | ICD-10-CM | POA: Diagnosis not present

## 2018-06-01 DIAGNOSIS — F32A Depression, unspecified: Secondary | ICD-10-CM

## 2018-06-01 DIAGNOSIS — K5130 Ulcerative (chronic) rectosigmoiditis without complications: Secondary | ICD-10-CM

## 2018-06-01 LAB — T-HELPER CELL (CD4) - (RCID CLINIC ONLY)
CD4 % Helper T Cell: 21 % — ABNORMAL LOW (ref 33–55)
CD4 T Cell Abs: 730 /uL (ref 400–2700)

## 2018-06-01 NOTE — Assessment & Plan Note (Signed)
He is doing well Continues on triumeq.  Will check his labs today (explained to him) His vax were updated at last visit.  Offered/refused condoms.  rtc 9 months.

## 2018-06-01 NOTE — Progress Notes (Signed)
   Subjective:    Patient ID: Connor [name], male    DOB: Sep 14, 1985, 33 y.o.   MRN: [id number]  HPI 33yo M withhx of HIV/AIDS, Ulcerative colitis, was having difficulty with his ART previously ( headaches, trouble sleeping). Was changed to complera April 2013, then to triumeq January 2015 (due to food issues). Has received Hep B series x 2. At his f/u 11-2017, he disliked the triumeq (headaches). He was changed to dovato.  Feels better than last visit 02-2018.  More energy.  He went back to triumeq for his ART.  His wt has been steady. Not back to exercising.  No recurrence of his prev folliculitis.   HIV 1 RNA Quant (copies/mL)  Date Value  11/20/2017 <20 NOT DETECTED  02/17/2017 <20 NOT DETECTED  09/28/2015 <20   CD4 T Cell Abs (/uL)  Date Value  11/20/2017 730  02/17/2017 860  07/19/2016 840    Review of Systems  Constitutional: Negative for appetite change and unexpected weight change.  Gastrointestinal: Negative for constipation and diarrhea.  Endocrine: Positive for polyuria.  Genitourinary: Negative for difficulty urinating.  Skin: Negative for wound.  Psychiatric/Behavioral: Negative for dysphoric mood and sleep disturbance.  Please see HPI. All other systems reviewed and negative.     Objective:   Physical Exam Constitutional:      Appearance: Normal appearance.  HENT:     Mouth/Throat:     Mouth: Mucous membranes are moist.     Pharynx: No oropharyngeal exudate.  Eyes:     Extraocular Movements: Extraocular movements intact.     Pupils: Pupils are equal, round, and reactive to light.  Neck:     Musculoskeletal: Normal range of motion and neck supple.  Cardiovascular:     Rate and Rhythm: Normal rate and regular rhythm.  Pulmonary:     Effort: Pulmonary effort is normal.     Breath sounds: Normal breath sounds.  Abdominal:     General: Bowel sounds are normal. There is no distension.     Palpations: Abdomen is soft.     Tenderness: There is  no abdominal tenderness.  Musculoskeletal:     Right lower leg: No edema.     Left lower leg: No edema.  Neurological:     General: No focal deficit present.     Mental Status: He is alert.  Psychiatric:        Mood and Affect: Mood normal.           Assessment & Plan:

## 2018-06-01 NOTE — Assessment & Plan Note (Signed)
Appears to be stable.

## 2018-06-01 NOTE — Assessment & Plan Note (Signed)
Encouraged diet and exercise.

## 2018-06-01 NOTE — Assessment & Plan Note (Signed)
Appears to be quiescent.  Will continue to monitor.

## 2018-06-04 LAB — COMPREHENSIVE METABOLIC PANEL
AG Ratio: 1.4 (calc) (ref 1.0–2.5)
ALT: 11 U/L (ref 9–46)
AST: 14 U/L (ref 10–40)
Albumin: 4.3 g/dL (ref 3.6–5.1)
Alkaline phosphatase (APISO): 105 U/L (ref 36–130)
BUN: 16 mg/dL (ref 7–25)
CO2: 26 mmol/L (ref 20–32)
Calcium: 9.8 mg/dL (ref 8.6–10.3)
Chloride: 105 mmol/L (ref 98–110)
Creat: 1.01 mg/dL (ref 0.60–1.35)
Globulin: 3 g/dL (calc) (ref 1.9–3.7)
Glucose, Bld: 101 mg/dL — ABNORMAL HIGH (ref 65–99)
Potassium: 4.2 mmol/L (ref 3.5–5.3)
Sodium: 140 mmol/L (ref 135–146)
Total Bilirubin: 0.3 mg/dL (ref 0.2–1.2)
Total Protein: 7.3 g/dL (ref 6.1–8.1)

## 2018-06-04 LAB — HIV-1 RNA QUANT-NO REFLEX-BLD
HIV 1 RNA Quant: <20 copies/mL — AB
HIV-1 RNA Quant, Log: <1.3 Log copies/mL — AB

## 2018-06-04 LAB — LIPID PANEL
CHOL/HDL RATIO: 3.9 (calc) (ref <5.0)
CHOLESTEROL: 163 mg/dL (ref <200)
HDL: 42 mg/dL (ref >=40)
LDL CHOLESTEROL (CALC): 105 mg/dL — AB
Non-HDL Cholesterol (Calc): 121 mg/dL (calc) (ref <130)
Triglycerides: 72 mg/dL (ref <150)

## 2018-06-04 LAB — CBC
HEMATOCRIT: 37.3 % — AB (ref 38.5–50.0)
Hemoglobin: 12.8 g/dL — ABNORMAL LOW (ref 13.2–17.1)
MCH: 29.6 pg (ref 27.0–33.0)
MCHC: 34.3 g/dL (ref 32.0–36.0)
MCV: 86.3 fL (ref 80.0–100.0)
MPV: 12.1 fL (ref 7.5–12.5)
PLATELETS: 213 10*3/uL (ref 140–400)
RBC: 4.32 10*6/uL (ref 4.20–5.80)
RDW: 13.4 % (ref 11.0–15.0)
WBC: 10.1 10*3/uL (ref 3.8–10.8)

## 2018-06-04 LAB — FLUORESCENT TREPONEMAL AB(FTA)-IGG-BLD: Fluorescent Treponemal ABS: REACTIVE — AB

## 2018-06-04 LAB — RPR TITER: RPR Titer: 1:8 {titer} — ABNORMAL HIGH

## 2018-06-04 LAB — RPR: RPR Ser Ql: REACTIVE — AB

## 2018-06-20 ENCOUNTER — Telehealth: Payer: Self-pay | Admitting: Behavioral Health

## 2018-06-20 NOTE — Telephone Encounter (Signed)
Patient called stating he was concerned about his Syphilis Titer of 1:8 that resulted 2/14.  Patient had a 1:8 Titer 11/2017 and patient is concerned that there was no change after being treated with one dose of 2.4 million units of Bicillin 11/25/2018.  Informed patient that RN would reach out to Dr. Johnnye Sima to see if he needs retreatment.  Patient verbalized understanding.  Pricilla Riffle RN

## 2018-06-21 NOTE — Telephone Encounter (Signed)
Will consider him "serofast".  Continued antibody positive after treatment. If it rises further, he would need further treatment.

## 2018-06-26 MED FILL — TRIUMEQ 600-50-300 MG TABS: 600-50-300 | 30 days supply | Qty: 30 | Fill #1

## 2018-07-20 MED FILL — TRIUMEQ 600-50-300 MG TABS: 600-50-300 | 30 days supply | Qty: 30 | Fill #2

## 2018-07-30 ENCOUNTER — Telehealth: Payer: 59 | Admitting: Physician Assistant

## 2018-07-30 ENCOUNTER — Encounter: Payer: Self-pay | Admitting: Physician Assistant

## 2018-07-30 DIAGNOSIS — L03019 Cellulitis of unspecified finger: Secondary | ICD-10-CM

## 2018-07-30 MED ORDER — CEPHALEXIN 500 MG PO CAPS: 500.0000 mg | ORAL_CAPSULE | Freq: Four times a day (QID) | ORAL | 0 refills | Status: DC

## 2018-07-30 MED FILL — CEPHALEXIN 500 MG CAPSULE: 500 | 5 days supply | Qty: 20 | Fill #0

## 2018-07-30 NOTE — Progress Notes (Signed)
E Visit for Cellulitis  We are sorry that you are not feeling well. Here is how we plan to help!  Based on what you shared with me it looks like you have cellulitis.  Cellulitis looks like areas of skin redness, swelling, and warmth; it develops as a result of bacteria entering under the skin. Little red spots and/or bleeding can be seen in skin, and tiny surface sacs containing fluid can occur. Fever can be present. Cellulitis is almost always on one side of a body, and the lower limbs are the most common site of involvement.   I have prescribed:  Keflex 555m orally, 4 times a day for 5 days     It appears that an abscess may be beginning proximal to the nailbed. If your current symptoms continue despite treatment with antibiotics, or if swelling increases, that is an indcation that abscess has formed and must be drained. Also, if symptoms don't improve have an in office follow up with your doctor or at an urgent care to rule out other bacteria infections such as MRSA with culture of the abscess/infection.   HOME CARE:  . Take your medications as ordered and take all of them, even if the skin irritation appears to be healing.   GET HELP RIGHT AWAY IF:  . Symptoms that don't begin to go away within 48 hours. . Severe redness persists or worsens . If the area turns color, spreads or swells. . If it blisters and opens, develops yellow-brown crust or bleeds. . You develop a fever or chills. . If the pain increases or becomes unbearable.  . Are unable to keep fluids and food down.  MAKE SURE YOU    Understand these instructions.  Will watch your condition.  Will get help right away if you are not doing well or get worse.  Thank you for choosing an e-visit. Your e-visit answers were reviewed by a board certified advanced clinical practitioner to complete your personal care plan. Depending upon the condition, your plan could have included both over the counter or prescription  medications. Please review your pharmacy choice. Make sure the pharmacy is open so you can pick up prescription now. If there is a problem, you may contact your provider through [employer] have the prescription routed to another pharmacy. Your safety is important to [place] If you have drug allergies check your prescription carefully.  For the next 24 hours you can use MyChart to ask questions about today's visit, request a non-urgent call back, or ask for a work or school excuse. You will get an email in the next two days asking about your experience. I hope that your e-visit has been valuable and will speed your recovery.  I have spent 7 min in completion and review of this note- SLacy DuverneyPBaptist Memorial Hospital [facility]

## 2018-08-13 MED FILL — TRIUMEQ 600-50-300 MG TABS: 600-50-300 | 30 days supply | Qty: 30 | Fill #3

## 2018-09-06 MED FILL — TRIUMEQ 600-50-300 MG TABS: 600-50-300 | 30 days supply | Qty: 30 | Fill #4

## 2018-09-13 ENCOUNTER — Other Ambulatory Visit: Payer: Self-pay

## 2018-09-13 ENCOUNTER — Telehealth: Payer: Self-pay | Admitting: Radiology

## 2018-09-13 ENCOUNTER — Ambulatory Visit (INDEPENDENT_AMBULATORY_CARE_PROVIDER_SITE_OTHER): Payer: 59 | Admitting: Internal Medicine

## 2018-09-13 ENCOUNTER — Encounter: Payer: Self-pay | Admitting: Internal Medicine

## 2018-09-13 VITALS — BP 124/80 | HR 80 | Temp 98.4°F | Ht 70.0 in | Wt 250.0 lb

## 2018-09-13 DIAGNOSIS — F419 Anxiety disorder, unspecified: Secondary | ICD-10-CM | POA: Diagnosis not present

## 2018-09-13 DIAGNOSIS — K219 Gastro-esophageal reflux disease without esophagitis: Secondary | ICD-10-CM | POA: Diagnosis not present

## 2018-09-13 DIAGNOSIS — K5130 Ulcerative (chronic) rectosigmoiditis without complications: Secondary | ICD-10-CM | POA: Diagnosis not present

## 2018-09-13 DIAGNOSIS — F5101 Primary insomnia: Secondary | ICD-10-CM

## 2018-09-13 DIAGNOSIS — R7303 Prediabetes: Secondary | ICD-10-CM

## 2018-09-13 DIAGNOSIS — Z0000 Encounter for general adult medical examination without abnormal findings: Secondary | ICD-10-CM

## 2018-09-13 DIAGNOSIS — B20 Human immunodeficiency virus [HIV] disease: Secondary | ICD-10-CM | POA: Diagnosis not present

## 2018-09-13 DIAGNOSIS — N3943 Post-void dribbling: Secondary | ICD-10-CM

## 2018-09-13 DIAGNOSIS — R350 Frequency of micturition: Secondary | ICD-10-CM

## 2018-09-13 DIAGNOSIS — E559 Vitamin D deficiency, unspecified: Secondary | ICD-10-CM

## 2018-09-13 DIAGNOSIS — F329 Major depressive disorder, single episode, unspecified: Secondary | ICD-10-CM

## 2018-09-13 DIAGNOSIS — R351 Nocturia: Secondary | ICD-10-CM

## 2018-09-13 DIAGNOSIS — F32A Depression, unspecified: Secondary | ICD-10-CM

## 2018-09-13 LAB — HEMOGLOBIN A1C: Hgb A1c MFr Bld: 6.2 % (ref 4.6–6.5)

## 2018-09-13 LAB — VITAMIN D 25 HYDROXY (VIT D DEFICIENCY, FRACTURES): VITD: 102.45 ng/mL (ref 30.00–100.00)

## 2018-09-13 MED ORDER — OMEPRAZOLE 40 MG PO CPDR: 40.0000 mg | DELAYED_RELEASE_CAPSULE | Freq: Every day | ORAL | 5 refills | Status: DC

## 2018-09-13 MED ORDER — ZOLPIDEM TARTRATE ER 6.25 MG PO TBCR: 6.2500 mg | EXTENDED_RELEASE_TABLET | Freq: Every evening | ORAL | 0 refills | Status: DC | PRN

## 2018-09-13 MED ORDER — TAMSULOSIN HCL 0.4 MG PO CAPS: 0.4000 mg | ORAL_CAPSULE | Freq: Every day | ORAL | 2 refills | Status: DC

## 2018-09-13 MED FILL — OMEPRAZOLE 40 MG CPDR: 40 | 30 days supply | Qty: 30 | Fill #0

## 2018-09-13 MED FILL — TAMSULOSIN HCL 0.4 MG CAP: 0.4 | 30 days supply | Qty: 30 | Fill #0

## 2018-09-13 MED FILL — ZOLPIDEM TART ER 6.25 MG TA: 6.25 | 15 days supply | Qty: 15 | Fill #0

## 2018-09-13 NOTE — Assessment & Plan Note (Addendum)
Repeat Vit D today Consider adding Calcium pending level

## 2018-09-13 NOTE — Assessment & Plan Note (Signed)
Currently not an issue Will monitor 

## 2018-09-13 NOTE — Assessment & Plan Note (Signed)
Deteriorated Increase Omeprazole to 40 mg daily, RX sent to pharmacy Discussed how stress, weight affect reflux

## 2018-09-13 NOTE — Assessment & Plan Note (Signed)
Repeat A1C today Encouraged him to consume a low carb diet and exercise for weight loss

## 2018-09-13 NOTE — Progress Notes (Signed)
Subjective:    Patient ID: Connor [name], male    DOB: 1985/08/08, 33 y.o.   MRN: [id number]  HPI  Pt presents to the clinic today for his annual exam. He is also due to follow up chronic conditions.  HIV: His last viral load was undetectable, 05/2018. CD4 count 730. He is taking Triumeq as prescribed. He follows with Dr. Johnnye Sima, note from 05/2018 reviewed.  Anxiety and Depression: Chronic mild anxiety and dysthymia. Triggered by stress and his HIV status. He is managing well off meds at this time. He has failed Lexapro, Prozac, Buspar and Wellbutrin in the past. He denies SI/HI. He is not currently seeing a therapist.  GERD: He is not sure what triggers this. He is taking Omeprazole OTC with minimal relief. He had an upper GI > 10 years ago.  Insomnia: He has trouble staying asleep. He is able to fall asleep. He takes Ambien as needed with good relief. There is no sleep study on file, but he reports he had a sleep study many years ago.  Ulcerative Colitis: Diagnosed 2007. He is not taking any medications for this. He denies blood in his stool. He is not currently following with GI. There is no colonoscopy on file, but reports he had one > 10 years ago.  Prediabetes: His last A1C was 5.9%, 07/2017. He is not taking any diabetic medication. He does not check his sugars.  Vit D Deficiency: He is taking 50000 units daily, no Calcium.  Flu: 01/2018 Tetanus: 04/2015 Pneumovax: 04/2016 Dentist: biannually  Diet: He does eat meat. He consumes fruits and veggies daily. He does eat fried foods. He drinks mostly water, soda. Exercise: None  Review of Systems      Past Medical History:  Diagnosis Date  . Anxiety   . Depression   . History of syphilis   . HIV infection ([place])   . Ulcerative colitis ([place])   . Vitamin D deficiency     Current Outpatient Medications  Medication Sig Dispense Refill  . abacavir-dolutegravir-lamiVUDine (TRIUMEQ) 600-50-300 MG tablet Take 1 tablet by  mouth daily. 90 tablet 3  . Multiple Vitamin (MULTIVITAMIN) capsule Take 1 capsule by mouth daily.    Marland Kitchen zolpidem (AMBIEN) 5 MG tablet Take 1 tablet (5 mg total) by mouth at bedtime as needed for sleep. 21 tablet 0   No current facility-administered medications for this visit.     No Known Allergies  Family History  Problem Relation Age of Onset  . Hypertension Mother   . Alcohol abuse Father   . Hypertension Father   . Diabetes Maternal Grandmother   . Stroke Paternal Grandfather   . Cancer Neg Hx     Social History   Socioeconomic History  . Marital status: Single    Spouse name: Not on file  . Number of children: Not on file  . Years of education: Not on file  . Highest education level: Not on file  Occupational History  . Not on file  Social Needs  . Financial resource strain: Not on file  . Food insecurity:    Worry: Not on file    Inability: Not on file  . Transportation needs:    Medical: Not on file    Non-medical: Not on file  Tobacco Use  . Smoking status: Never Smoker  . Smokeless tobacco: Never Used  Substance and Sexual Activity  . Alcohol use: No    Alcohol/week: 0.0 standard drinks  . Drug use: No  .  Sexual activity: Yes    Comment: refused condoms  Lifestyle  . Physical activity:    Days per week: Not on file    Minutes per session: Not on file  . Stress: Not on file  Relationships  . Social connections:    Talks on phone: Not on file    Gets together: Not on file    Attends religious service: Not on file    Active member of club or organization: Not on file    Attends meetings of clubs or organizations: Not on file    Relationship status: Not on file  . Intimate partner violence:    Fear of current or ex partner: Not on file    Emotionally abused: Not on file    Physically abused: Not on file    Forced sexual activity: Not on file  Other Topics Concern  . Not on file  Social History Narrative  . Not on file     Constitutional:  Denies fever, malaise, fatigue, headache or abrupt weight changes.  HEENT: Denies eye pain, eye redness, ear pain, ringing in the ears, wax buildup, runny nose, nasal congestion, bloody nose, or sore throat. Respiratory: Denies difficulty breathing, shortness of breath, cough or sputum production.   Cardiovascular: Denies chest pain, chest tightness, palpitations or swelling in the hands or feet.  Gastrointestinal: Pt reports reflux. Denies abdominal pain, bloating, constipation, diarrhea or blood in the stool.  GU: Pt reports urinary frequency, dribbling and nocturia. Denies urgency, pain with urination, burning sensation, blood in urine, odor or discharge. Musculoskeletal: Denies decrease in range of motion, difficulty with gait, muscle pain or joint pain and swelling.  Skin: Denies redness, rashes, lesions or ulcercations.  Neurological: Pt reports insomnia. Denies dizziness, difficulty with memory, difficulty with speech or problems with balance and coordination.  Psych: Pt has a history of anxiety and depression. Denies SI/HI.  No other specific complaints in a complete review of systems (except as listed in HPI above).  Objective:   Physical Exam  BP 124/80   Pulse 80   Temp 98.4 F (36.9 C) (Oral)   Ht 5' 10"  (1.778 m)   Wt 250 lb (113.4 kg)   SpO2 97%   BMI 35.87 kg/m  Wt Readings from Last 3 Encounters:  09/13/18 250 lb (113.4 kg)  06/01/18 248 lb (112.5 kg)  04/24/18 248 lb 8 oz (112.7 kg)    General: Appears his stated age, obese, in NAD. Skin: Warm, dry and intact. No rashes, lesions or ulcerations noted. HEENT: Head: normal shape and size; Eyes: sclera white, no icterus, conjunctiva pink, PERRLA and EOMs intact; Ears: Tm's gray and intact, normal light reflex;  Neck:  Neck supple, trachea midline. No masses, lumps or thyromegaly present.  Cardiovascular: Normal rate and rhythm. S1,S2 noted.  No murmur, rubs or gallops noted. No JVD or BLE edema.  Pulmonary/Chest:  Normal effort and positive vesicular breath sounds. No respiratory distress. No wheezes, rales or ronchi noted.  Abdomen: Soft and nontender. Normal bowel sounds. No distention or masses noted. Liver, spleen and kidneys non palpable. Musculoskeletal: Strength 5/5 BUE/BLE. No difficulty with gait.  Neurological: Alert and oriented. Cranial nerves II-XII grossly intact. Coordination normal.  Psychiatric: Mood and affect normal. Behavior is normal. Judgment and thought content normal.     BMET    Component Value Date/Time   NA 140 06/01/2018 0942   K 4.2 06/01/2018 0942   CL 105 06/01/2018 0942   CO2 26 06/01/2018 0942  GLUCOSE 101 (H) 06/01/2018 0942   BUN 16 06/01/2018 0942   CREATININE 1.01 06/01/2018 0942   CALCIUM 9.8 06/01/2018 0942   GFRNONAA 76 02/17/2017 0956   GFRAA 88 02/17/2017 0956    Lipid Panel     Component Value Date/Time   CHOL 163 06/01/2018 0942   TRIG 72 06/01/2018 0942   HDL 42 06/01/2018 0942   CHOLHDL 3.9 06/01/2018 0942   VLDL 14.2 07/27/2017 1457   LDLCALC 105 (H) 06/01/2018 0942    CBC    Component Value Date/Time   WBC 10.1 06/01/2018 0942   RBC 4.32 06/01/2018 0942   HGB 12.8 (L) 06/01/2018 0942   HCT 37.3 (L) 06/01/2018 0942   PLT 213 06/01/2018 0942   MCV 86.3 06/01/2018 0942   MCH 29.6 06/01/2018 0942   MCHC 34.3 06/01/2018 0942   RDW 13.4 06/01/2018 0942   LYMPHSABS 3,762 02/17/2017 0956   MONOABS 0.8 09/29/2014 1407   EOSABS 286 02/17/2017 0956   BASOSABS 44 02/17/2017 0956    Hgb A1C Lab Results  Component Value Date   HGBA1C 5.9 07/27/2017            Assessment & Plan:   Preventative Health Maintenance:  Flu, tetanus, pneumovax UTD Encouraged him to consume a balanced diet and exercise regimen Advised him to see an dentist annually Labs from 05/2018 reviewed- CBC, CMET, Lipid Will obtain Vit D and A1C today  Nocturia, Dribbling, Urinary Frequency:  He refused prostate exam Will check A1C Will trial Flomax  0.4 mg PO daily

## 2018-09-13 NOTE — Telephone Encounter (Signed)
[facility] called a critical VIT D level, 102.45. Results given to Webb Silversmith, NP

## 2018-09-13 NOTE — Assessment & Plan Note (Signed)
Stable off meds Support offered today Will monitor

## 2018-09-13 NOTE — Telephone Encounter (Signed)
Noted  

## 2018-09-13 NOTE — Assessment & Plan Note (Signed)
Will change Ambien 5 mg to Ambien 6.25 mg to see if it will help him sleep better through the night Encouraged a regular sleep routine He declines repeat sleep study at this time

## 2018-09-13 NOTE — Patient Instructions (Signed)

## 2018-09-13 NOTE — Assessment & Plan Note (Signed)
Undetectable on Triumeq Discussed the importance of safe sexual practices Will monitor

## 2018-10-02 ENCOUNTER — Ambulatory Visit: Payer: Self-pay | Admitting: Internal Medicine

## 2018-10-02 ENCOUNTER — Encounter: Payer: Self-pay | Admitting: Internal Medicine

## 2018-10-02 MED FILL — TRIUMEQ 600-50-300 MG TABS: 600-50-300 | 30 days supply | Qty: 30 | Fill #5

## 2018-10-02 NOTE — Telephone Encounter (Signed)
Patient calls with noticing his heart rate will beat faster than normal 130-140's bpm. This has occurred on/off over the last 1-2 months. Last time was yesterday. Feels fine today.He feels very fatigued when it occurs and has to rest for 1-2 hours afterwards. Denies any sweating/CP/SOB with these episodes. Does seem to occur with exertion, mostly. No fever.Wears his apple watch to monitor. He unable to say how long the fast heartbeat last each time. 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 Attempted to warm transfer for appointment, left message on nurse triage line to call patient and schedule appointment.  Reason for Disposition . [1] Heart beating very rapidly (e.g., > 140 / minute) AND [2] not present now  (Exception: during exercise)  Answer Assessment - Initial Assessment Questions 1. DESCRIPTION: "Please describe your heart rate or heart beat that you are having" (e.g., fast/slow, regular/irregular, skipped or extra beats, "palpitations")     Feels fast heart rate. 140-150s bpm 4 days ago, yesterday felt his heart speed up. 2. ONSET: "When did it start?" (Minutes, hours or days)      About one month ago he noticed his heart beating faster than normal 3. DURATION: "How long does it last" (e.g., seconds, minutes, hours)     Awhile he unsure. 4. PATTERN "Does it come and go, or has it been constant since it started?"  "Does it get worse with exertion?"   "Are you feeling it now?"     Comes and goes depending on exertion. Feels normal this morning. 5. TAP: "Using your hand, can you tap out what you are feeling on a chair or table in front of you, so that I can hear?" (Note: not all patients can do this)       no 6. HEART RATE: "Can you tell me your heart rate?" "How many beats in 15 seconds?"  (Note: not all patients can do this)       96 bpm 7. RECURRENT SYMPTOM: "Have you ever  had this before?" If so, ask: "When was the last time?" and "What happened that time?"      This started several months ago. Comes and goes.  8. CAUSE: "What do you think is causing the palpitations?"     unsure 9. CARDIAC HISTORY: "Do you have any history of heart disease?" (e.g., heart attack, angina, bypass surgery, angioplasty, arrhythmia)      none 10. OTHER SYMPTOMS: "Do you have any other symptoms?" (e.g., dizziness, chest pain, sweating, difficulty breathing)       Fatigue, no sweating no dizziness no SOB/CP 11. PREGNANCY: "Is there any chance you are pregnant?" "When was your last menstrual period?"       na  Protocols used: [place]

## 2018-10-02 NOTE — Telephone Encounter (Signed)
Noted.  Will see patient tomorrow for appointment.

## 2018-10-02 NOTE — Telephone Encounter (Signed)
Appointment scheduled for tomorrow at 2:00.

## 2018-10-03 ENCOUNTER — Encounter: Payer: Self-pay | Admitting: Internal Medicine

## 2018-10-03 ENCOUNTER — Other Ambulatory Visit: Payer: Self-pay

## 2018-10-03 ENCOUNTER — Ambulatory Visit (INDEPENDENT_AMBULATORY_CARE_PROVIDER_SITE_OTHER): Payer: 59 | Admitting: Internal Medicine

## 2018-10-03 VITALS — BP 122/82 | HR 109 | Temp 98.9°F | Wt 250.0 lb

## 2018-10-03 DIAGNOSIS — R002 Palpitations: Secondary | ICD-10-CM | POA: Diagnosis not present

## 2018-10-03 NOTE — Progress Notes (Signed)
Subjective:    Patient ID: Connor [name], male    DOB: 10/31/1985, 33 y.o.   MRN: [id number]  HPI  Pt presents to the clinic today with c/o palpitations. He reports this started 2-3 weeks ago but it seems to be occurring more frequently. He denies dizziness, visual changes, feelings of syncope, chest pain or shortness of breath. He reports his Samsung watch captured a HR of 243, but he reports he could not tell as he did not feel like his heart was racing.  He does not drink a lot of caffeine. He does not drink energy drinks or smoke.  Review of Systems  Past Medical History:  Diagnosis Date  . Anxiety   . Depression   . History of syphilis   . HIV infection ([place])   . Ulcerative colitis ([place])   . Vitamin D deficiency     Current Outpatient Medications  Medication Sig Dispense Refill  . abacavir-dolutegravir-lamiVUDine (TRIUMEQ) 600-50-300 MG tablet Take 1 tablet by mouth daily. 90 tablet 3  . Multiple Vitamin (MULTIVITAMIN) capsule Take 1 capsule by mouth daily.    Marland Kitchen omeprazole (PRILOSEC) 40 MG capsule Take 1 capsule (40 mg total) by mouth daily. 30 capsule 5  . tamsulosin (FLOMAX) 0.4 MG CAPS capsule Take 1 capsule (0.4 mg total) by mouth daily. 30 capsule 2  . zolpidem (AMBIEN CR) 6.25 MG CR tablet Take 1 tablet (6.25 mg total) by mouth at bedtime as needed for sleep. 15 tablet 0   No current facility-administered medications for this visit.     No Known Allergies  Family History  Problem Relation Age of Onset  . Hypertension Mother   . Alcohol abuse Father   . Hypertension Father   . Diabetes Maternal Grandmother   . Stroke Paternal Grandfather   . Cancer Neg Hx     Social History   Socioeconomic History  . Marital status: Single    Spouse name: Not on file  . Number of children: Not on file  . Years of education: Not on file  . Highest education level: Not on file  Occupational History  . Not on file  Social Needs  . Financial resource strain: Not on  file  . Food insecurity    Worry: Not on file    Inability: Not on file  . Transportation needs    Medical: Not on file    Non-medical: Not on file  Tobacco Use  . Smoking status: Never Smoker  . Smokeless tobacco: Never Used  Substance and Sexual Activity  . Alcohol use: No    Alcohol/week: 0.0 standard drinks  . Drug use: No  . Sexual activity: Yes    Comment: refused condoms  Lifestyle  . Physical activity    Days per week: Not on file    Minutes per session: Not on file  . Stress: Not on file  Relationships  . Social Herbalist on phone: Not on file    Gets together: Not on file    Attends religious service: Not on file    Active member of club or organization: Not on file    Attends meetings of clubs or organizations: Not on file    Relationship status: Not on file  . Intimate partner violence    Fear of current or ex partner: Not on file    Emotionally abused: Not on file    Physically abused: Not on file    Forced sexual activity: Not on  file  Other Topics Concern  . Not on file  Social History Narrative  . Not on file     Constitutional: Denies fever, malaise, fatigue, headache or abrupt weight changes.  Respiratory: Denies difficulty breathing, shortness of breath, cough or sputum production.   Cardiovascular: Pt reports palpitations. Denies chest pain, chest tightness, or swelling in the hands or feet.  Neurological: Denies dizziness, difficulty with memory, difficulty with speech or problems with balance and coordination.    No other specific complaints in a complete review of systems (except as listed in HPI above).     Objective:   Physical Exam  BP 122/82   Pulse (!) 109   Temp 98.9 F (37.2 C) (Oral)   Wt 250 lb (113.4 kg)   SpO2 98%   BMI 35.87 kg/m  Wt Readings from Last 3 Encounters:  10/03/18 250 lb (113.4 kg)  09/13/18 250 lb (113.4 kg)  06/01/18 248 lb (112.5 kg)    General: Appears his stated age, obese, in NAD. Neck:   Neck supple, trachea midline. No masses, lumps or thyromegaly present.  Cardiovascular: Tachycardic with normal rhythm. S1,S2 noted.  No murmur, rubs or gallops noted. Pulmonary/Chest: Normal effort and positive vesicular breath sounds. No respiratory distress. No wheezes, rales or ronchi noted.  Neurological: Alert and oriented.   BMET    Component Value Date/Time   NA 140 06/01/2018 0942   K 4.2 06/01/2018 0942   CL 105 06/01/2018 0942   CO2 26 06/01/2018 0942   GLUCOSE 101 (H) 06/01/2018 0942   BUN 16 06/01/2018 0942   CREATININE 1.01 06/01/2018 0942   CALCIUM 9.8 06/01/2018 0942   GFRNONAA 76 02/17/2017 0956   GFRAA 88 02/17/2017 0956    Lipid Panel     Component Value Date/Time   CHOL 163 06/01/2018 0942   TRIG 72 06/01/2018 0942   HDL 42 06/01/2018 0942   CHOLHDL 3.9 06/01/2018 0942   VLDL 14.2 07/27/2017 1457   LDLCALC 105 (H) 06/01/2018 0942    CBC    Component Value Date/Time   WBC 10.1 06/01/2018 0942   RBC 4.32 06/01/2018 0942   HGB 12.8 (L) 06/01/2018 0942   HCT 37.3 (L) 06/01/2018 0942   PLT 213 06/01/2018 0942   MCV 86.3 06/01/2018 0942   MCH 29.6 06/01/2018 0942   MCHC 34.3 06/01/2018 0942   RDW 13.4 06/01/2018 0942   LYMPHSABS 3,762 02/17/2017 0956   MONOABS 0.8 09/29/2014 1407   EOSABS 286 02/17/2017 0956   BASOSABS 44 02/17/2017 0956    Hgb A1C Lab Results  Component Value Date   HGBA1C 6.2 09/13/2018            Assessment & Plan:   Palpitations:  Reason for ECG: palpitations Interpretation of ECG: tachycardic with normal rhythm Comparison: ^/2017, HR higher but otherwise unchanged Will check TSH, BMET and MG Consider referral to cardiology vs starting beta blocker  Will follow up after labs, return precautions discussed Webb Silversmith, NP

## 2018-10-03 NOTE — Patient Instructions (Signed)
Palpitations Palpitations are feelings that your heartbeat is not normal. Your heartbeat may feel like it is:  Uneven.  Faster than normal.  Fluttering.  Skipping a beat. This is usually not a serious problem. In some cases, you may need tests to rule out any serious problems. Follow these instructions at home: Pay attention to any changes in your condition. Take these actions to help manage your symptoms: Eating and drinking  Avoid: ? Coffee, tea, soft drinks, and energy drinks. ? Chocolate. ? Alcohol. ? Diet pills. Lifestyle   Try to lower your stress. These things can help you relax: ? Yoga. ? Deep breathing and meditation. ? Exercise. ? Using words and images to create positive thoughts (guided imagery). ? Using your mind to control things in your body (biofeedback).  Do not use drugs.  Get plenty of rest and sleep. Keep a regular bed time. General instructions   Take over-the-counter and prescription medicines only as told by your doctor.  Do not use any products that contain nicotine or tobacco, such as cigarettes and e-cigarettes. If you need help quitting, ask your doctor.  Keep all follow-up visits as told by your doctor. This is important. You may need more tests if palpitations do not go away or get worse. Contact a doctor if:  Your symptoms last more than 24 hours.  Your symptoms occur more often. Get help right away if you:  Have chest pain.  Feel short of breath.  Have a very bad headache.  Feel dizzy.  Pass out (faint). Summary  Palpitations are feelings that your heartbeat is uneven or faster than normal. It may feel like your heart is fluttering or skipping a beat.  Avoid food and drinks that may cause palpitations. These include caffeine, chocolate, and alcohol.  Try to lower your stress. Do not smoke or use drugs.  Get help right away if you faint or have chest pain, shortness of breath, a severe headache, or dizziness. This  information is not intended to replace advice given to you by your health care provider. Make sure you discuss any questions you have with your health care provider. Document Released: 01/12/2008 Document Revised: 05/17/2017 Document Reviewed: 05/17/2017 Elsevier Interactive Patient Education  2019 [employer].

## 2018-10-04 LAB — BASIC METABOLIC PANEL
BUN: 14 mg/dL (ref 6–23)
CO2: 28 mEq/L (ref 19–32)
Calcium: 9.6 mg/dL (ref 8.4–10.5)
Chloride: 105 mEq/L (ref 96–112)
Creatinine, Ser: 1.07 mg/dL (ref 0.40–1.50)
GFR: 95.07 mL/min (ref >60.00)
Glucose, Bld: 84 mg/dL (ref 70–99)
Potassium: 4.1 mEq/L (ref 3.5–5.1)
Sodium: 141 mEq/L (ref 135–145)

## 2018-10-04 LAB — TSH: TSH: 0.92 u[IU]/mL (ref 0.35–4.50)

## 2018-10-04 LAB — MAGNESIUM: Magnesium: 1.7 mg/dL (ref 1.5–2.5)

## 2018-10-08 ENCOUNTER — Other Ambulatory Visit: Payer: Self-pay | Admitting: Internal Medicine

## 2018-10-08 ENCOUNTER — Encounter: Payer: Self-pay | Admitting: Internal Medicine

## 2018-10-08 MED ORDER — ATENOLOL 25 MG PO TABS: 25.0000 mg | ORAL_TABLET | Freq: Every day | ORAL | 2 refills | Status: DC

## 2018-10-08 MED FILL — ATENOLOL 25 MG TABLET: 25 | 30 days supply | Qty: 30 | Fill #0

## 2018-10-25 MED FILL — OMEPRAZOLE 40 MG CPDR: 40 | 30 days supply | Qty: 30 | Fill #1

## 2018-10-26 ENCOUNTER — Telehealth: Payer: Self-pay | Admitting: Pharmacy Technician

## 2018-10-26 NOTE — Telephone Encounter (Signed)
[facility] Patient Advocate Encounter   I was successful in securing patient a $7500 grant from [employer] [place] (PAF) to provide copayment coverage for Triumeq. This will make the out of pocket cost $0.     I have spoken with the patient and he stated he had 38 tablets remaining and will call pharmacy when he needs a new refill mailed.    The billing information is as follows and has been shared with [place].   IFOYD:[id number]  PCN: [id number] Member ID: [id number] Group ID: [id number] Dates of Eligibility: 10/26/2018 through 10/26/2019  Patient knows to call the office with questions or concerns.  Bartholomew Crews, CPhT [facility] Phone: [phone] Fax: [phone] 10/26/2018 11:45 AM

## 2018-11-05 MED FILL — TRIUMEQ 600-50-300 MG TABS: 600-50-300 | 30 days supply | Qty: 30 | Fill #6

## 2018-11-08 NOTE — Telephone Encounter (Addendum)
Inquired about the patient's copayment structure on the Med[employer] Save plan so that we could prepare his patient assistance for future refills. He has met his $1400 deductible. Specialty Variable Copay program prorates the members copay based on the max payout of the coupon program supplied by [employer]. Due to the coupon program running for one year rather than a calendar year, the coupon ran out of funds. His PAF grant will cover some of the cost and we can try and get a new coupon after August 20th of this year.

## 2018-11-29 MED FILL — OMEPRAZOLE 40 MG CPDR: 40 | 30 days supply | Qty: 30 | Fill #2

## 2018-11-29 MED FILL — TRIUMEQ 600-50-300 MG TABS: 600-50-300 | 30 days supply | Qty: 30 | Fill #7

## 2018-12-27 MED FILL — TRIUMEQ 600-50-300 MG TABS: 600-50-300 | 30 days supply | Qty: 30 | Fill #8

## 2019-01-25 MED FILL — TRIUMEQ 600-50-300 MG TABS: 600-50-300 | 30 days supply | Qty: 30 | Fill #9

## 2019-02-25 MED FILL — TRIUMEQ 600-50-300 MG TABS: 600-50-300 | 30 days supply | Qty: 30 | Fill #10

## 2019-03-26 MED FILL — TRIUMEQ 600-50-300 MG TABS: 600-50-300 | 30 days supply | Qty: 30 | Fill #11

## 2019-04-18 ENCOUNTER — Other Ambulatory Visit: Payer: Self-pay | Admitting: Infectious Diseases

## 2019-04-18 DIAGNOSIS — B20 Human immunodeficiency virus [HIV] disease: Secondary | ICD-10-CM

## 2019-04-22 ENCOUNTER — Telehealth: Payer: Self-pay | Admitting: *Deleted

## 2019-04-22 DIAGNOSIS — B20 Human immunodeficiency virus [HIV] disease: Secondary | ICD-10-CM

## 2019-04-22 MED ORDER — TRIUMEQ 600-50-300 MG PO TABS: 1.0000 | ORAL_TABLET | Freq: Every day | ORAL | 0 refills | Status: DC

## 2019-04-22 NOTE — Telephone Encounter (Addendum)
Thanks    ----- Message -----  From: Nurse Cyndia Bent  Sent: 04/22/19 5:20 PM  To: Connor [name]  Subject: RE: Appointment Request    I sent in a 30 day supply of Triumeq to [facility]. There are appointments with Dr Algis Downs partners available this month. They will be able to send in more refills at your office visit this month. Please give [place] a call at [phone] to set up an appointment that works for you!  Thank you,  Sharyn Lull      Will send in 30 day supply Triumeq to [employer]  I need a medication refill !   I don't know when I can get in I will call to make appointment     ----- Message -----  From: Nurse Cyndia Bent  Sent: 04/22/19 12:01 PM  To: Connor [name]  Subject: RE: Appointment Request    [facility].  Dr Johnnye Sima is only here in the afternoons. Would that work with you at all? If not, one of his partners would certainly be able to see you in clinic.   Please give [place] a call at [phone] to schedule something that works.  Thank you,   Sharyn Lull       ----- Message -----    From:Connor [name]    Sent:04/22/2019 11:11 AM EST     DU:[id number] Johnnye Sima, MD  Subject:Appointment Request    Appointment Request From: Connor [name]    With Provider: Bobby Rumpf, MD [facility]]    Preferred Date Range: 04/22/2019 - 05/19/2019    Preferred Times: Monday Morning, Tuesday Morning, Wednesday Morning, Friday Morning    Reason for visit: Office Visit    Comments:  Medication refill

## 2019-04-29 MED FILL — TRIUMEQ 600-50-300 MG TABS: 600-50-300 | 30 days supply | Qty: 30 | Fill #0

## 2019-05-02 NOTE — Telephone Encounter (Signed)
RN left message reminding patient he needs to call and schedule an appointment this month. Landis Gandy, RN

## 2019-05-07 ENCOUNTER — Other Ambulatory Visit: Payer: 59

## 2019-05-07 ENCOUNTER — Other Ambulatory Visit (HOSPITAL_COMMUNITY)
Admission: RE | Admit: 2019-05-07 | Discharge: 2019-05-07 | Disposition: A | Discharge status: Home / Self Care | Payer: 59 | Source: Ambulatory Visit | Attending: Infectious Diseases | Admitting: Infectious Diseases

## 2019-05-07 ENCOUNTER — Other Ambulatory Visit: Payer: Self-pay

## 2019-05-07 DIAGNOSIS — Z113 Encounter for screening for infections with a predominantly sexual mode of transmission: Secondary | ICD-10-CM | POA: Diagnosis not present

## 2019-05-07 DIAGNOSIS — B20 Human immunodeficiency virus [HIV] disease: Secondary | ICD-10-CM | POA: Diagnosis not present

## 2019-05-08 LAB — T-HELPER CELL (CD4) - (RCID CLINIC ONLY)
CD4 % Helper T Cell: 22 % — ABNORMAL LOW (ref 33–65)
CD4 T Cell Abs: 929 /uL (ref 400–1790)

## 2019-05-08 LAB — URINE CYTOLOGY ANCILLARY ONLY
Chlamydia: NEGATIVE
Comment: NEGATIVE
Comment: NORMAL
Neisseria Gonorrhea: NEGATIVE

## 2019-05-10 LAB — HIV-1 RNA QUANT-NO REFLEX-BLD
HIV 1 RNA Quant: <20 copies/mL
HIV-1 RNA Quant, Log: <1.3 Log copies/mL

## 2019-05-10 MED FILL — OMEPRAZOLE 40 MG CPDR: 40 | 30 days supply | Qty: 30 | Fill #2

## 2019-05-16 ENCOUNTER — Telehealth: Payer: Self-pay

## 2019-05-16 ENCOUNTER — Other Ambulatory Visit: Payer: Self-pay

## 2019-05-16 NOTE — Telephone Encounter (Signed)
COVID-19 Pre-Screening Questions:05/16/19  Do you currently have a fever (>100 F), chills or unexplained body aches? NO  Are you currently experiencing new cough, shortness of breath, sore throat, runny nose? NO .  Have you recently travelled outside the state of [place] in the last 14 days? NO .  Have you been in contact with someone that is currently pending confirmation of Covid19 testing or has been confirmed to have the [place] virus?  NO  **If the patient answers NO to ALL questions -  advise the patient to please call the clinic before coming to the office should any symptoms develop.

## 2019-05-17 ENCOUNTER — Other Ambulatory Visit: Payer: Self-pay

## 2019-05-17 ENCOUNTER — Other Ambulatory Visit (INDEPENDENT_AMBULATORY_CARE_PROVIDER_SITE_OTHER): Payer: 59

## 2019-05-17 ENCOUNTER — Ambulatory Visit (INDEPENDENT_AMBULATORY_CARE_PROVIDER_SITE_OTHER): Payer: 59 | Admitting: Infectious Diseases

## 2019-05-17 DIAGNOSIS — B20 Human immunodeficiency virus [HIV] disease: Secondary | ICD-10-CM

## 2019-05-17 DIAGNOSIS — E669 Obesity, unspecified: Secondary | ICD-10-CM

## 2019-05-17 DIAGNOSIS — K5130 Ulcerative (chronic) rectosigmoiditis without complications: Secondary | ICD-10-CM | POA: Diagnosis not present

## 2019-05-17 DIAGNOSIS — Z23 Encounter for immunization: Secondary | ICD-10-CM | POA: Diagnosis not present

## 2019-05-17 MED ORDER — EMTRICITAB-RILPIVIR-TENOFOV AF 200-25-25 MG PO TABS: 1.0000 | ORAL_TABLET | Freq: Every day | ORAL | 3 refills | Status: DC

## 2019-05-17 MED FILL — ODEFSEY 200-25-25 MG TABS: 200-25-25 | 30 days supply | Qty: 30 | Fill #0

## 2019-05-17 NOTE — Assessment & Plan Note (Signed)
Will reinforce need for diet and exercise He is up 40# since 2016

## 2019-05-17 NOTE — Assessment & Plan Note (Signed)
Has been quiescent. Will watch.

## 2019-05-17 NOTE — Assessment & Plan Note (Addendum)
Will give him PCV 23 to complete series His other vax are up to date.  Offered/refused condoms.  Will try non-INI regimen to see if this helps with fatigue (odefsy) Will space 12h from his PPI.  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

## 2019-05-17 NOTE — Progress Notes (Signed)
   Subjective:    Patient ID: Connor [name], male    DOB: 11-04-1985, 34 y.o.   MRN: [id number]  HPI 34yo M withhx of HIV/AIDS, Ulcerative colitis, was having difficulty with his ART previously ( headaches, trouble sleeping). Was changed to complera April 2013, then to triumeq January 2015 (due to food issues). Has received Hep B series x 2. At his f/u 11-2017, he disliked the triumeq (headaches). He was changed to dovato (this made him tired). At his last visit he had changed back to triumeq.  Has been feeling well.  Doesn't mind taking pill qday, more that it makes him fatigued. Even if he takes qhs.  Has gotten COVID vax.   HIV 1 RNA Quant (copies/mL)  Date Value  05/07/2019 <20 NOT DETECTED  06/01/2018 <20 DETECTED (A)  11/20/2017 <20 NOT DETECTED   CD4 T Cell Abs (/uL)  Date Value  05/07/2019 929  06/01/2018 730  11/20/2017 730    Review of Systems  Constitutional: Negative for appetite change, chills, fatigue, fever and unexpected weight change.  Respiratory: Negative for cough and shortness of breath.   Gastrointestinal: Negative for blood in stool, constipation and diarrhea.  Genitourinary: Negative for difficulty urinating.  Please see HPI. All other systems reviewed and negative.      Objective:   Physical Exam Constitutional:      Appearance: Normal appearance.  HENT:     Mouth/Throat:     Mouth: Mucous membranes are moist.     Pharynx: No oropharyngeal exudate.  Eyes:     Extraocular Movements: Extraocular movements intact.     Pupils: Pupils are equal, round, and reactive to light.  Cardiovascular:     Rate and Rhythm: Normal rate and regular rhythm.  Pulmonary:     Effort: Pulmonary effort is normal.     Breath sounds: Normal breath sounds.  Abdominal:     General: Bowel sounds are normal. There is no distension.     Palpations: Abdomen is soft.  Musculoskeletal:        General: Normal range of motion.     Cervical back: Normal range of  motion and neck supple.     Right lower leg: No edema.     Left lower leg: No edema.  Neurological:     General: No focal deficit present.     Mental Status: He is alert.  Psychiatric:        Mood and Affect: Mood normal.           Assessment & Plan:

## 2019-06-19 MED FILL — ODEFSEY 200-25-25 MG TABS: 200-25-25 | 30 days supply | Qty: 30 | Fill #1

## 2019-07-30 ENCOUNTER — Other Ambulatory Visit: Payer: 59

## 2019-08-12 MED FILL — ODEFSEY 200-25-25 MG TABS: 200-25-25 | 30 days supply | Qty: 30 | Fill #2

## 2019-08-13 ENCOUNTER — Other Ambulatory Visit: Payer: 59

## 2019-08-13 ENCOUNTER — Other Ambulatory Visit: Payer: Self-pay

## 2019-08-13 DIAGNOSIS — B20 Human immunodeficiency virus [HIV] disease: Secondary | ICD-10-CM

## 2019-08-13 DIAGNOSIS — Z113 Encounter for screening for infections with a predominantly sexual mode of transmission: Secondary | ICD-10-CM | POA: Diagnosis not present

## 2019-08-14 LAB — T-HELPER CELL (CD4) - (RCID CLINIC ONLY)
CD4 % Helper T Cell: 23 % — ABNORMAL LOW (ref 33–65)
CD4 T Cell Abs: 780 /uL (ref 400–1790)

## 2019-08-15 ENCOUNTER — Telehealth: Payer: Self-pay | Admitting: Pharmacy Technician

## 2019-08-15 ENCOUNTER — Ambulatory Visit: Payer: 59 | Admitting: Infectious Diseases

## 2019-08-15 NOTE — Telephone Encounter (Signed)
[facility] Patient Advocate Encounter   Was successful in obtaining a [employer] copay card for Connor [name].  This copay card will make the patients copay $0.  The billing information is as follows and has been shared with [place].  RxBin: [id number] PCN: ACCESS Member ID: [id number] Group ID: [id number]  Connor [name] Patient [facility] for Infectious Disease Phone: [phone] Fax:  [phone]

## 2019-08-16 ENCOUNTER — Encounter: Payer: 59 | Admitting: Infectious Diseases

## 2019-08-16 LAB — HIV-1 RNA QUANT-NO REFLEX-BLD
HIV 1 RNA Quant: <20 copies/mL
HIV-1 RNA Quant, Log: <1.3 Log copies/mL

## 2019-08-16 LAB — RPR TITER: RPR Titer: 1:16 {titer} — ABNORMAL HIGH

## 2019-08-16 LAB — RPR: RPR Ser Ql: REACTIVE — AB

## 2019-08-16 LAB — FLUORESCENT TREPONEMAL AB(FTA)-IGG-BLD: Fluorescent Treponemal ABS: REACTIVE — AB

## 2019-08-28 ENCOUNTER — Telehealth: Payer: Self-pay

## 2019-08-28 NOTE — Telephone Encounter (Signed)
COVID-19 Pre-Screening Questions:08/28/19  Do you currently have a fever (>100 F), chills or unexplained body aches? NO  Are you currently experiencing new cough, shortness of breath, sore throat, runny nose?NO .  Have you recently travelled outside the state of [place] in the last 14 days? N O .  Have you been in contact with someone that is currently pending confirmation of Covid19 testing or has been confirmed to have the [place] virus? NO **If the patient answers NO to ALL questions -  advise the patient to please call the clinic before coming to the office should any symptoms develop.

## 2019-08-29 ENCOUNTER — Other Ambulatory Visit: Payer: Self-pay

## 2019-08-29 ENCOUNTER — Ambulatory Visit (INDEPENDENT_AMBULATORY_CARE_PROVIDER_SITE_OTHER): Payer: 59 | Admitting: Infectious Diseases

## 2019-08-29 ENCOUNTER — Encounter: Payer: Self-pay | Admitting: Infectious Diseases

## 2019-08-29 DIAGNOSIS — B20 Human immunodeficiency virus [HIV] disease: Secondary | ICD-10-CM

## 2019-08-29 DIAGNOSIS — F329 Major depressive disorder, single episode, unspecified: Secondary | ICD-10-CM | POA: Diagnosis not present

## 2019-08-29 DIAGNOSIS — F419 Anxiety disorder, unspecified: Secondary | ICD-10-CM | POA: Diagnosis not present

## 2019-08-29 DIAGNOSIS — A539 Syphilis, unspecified: Secondary | ICD-10-CM

## 2019-08-29 DIAGNOSIS — F32A Depression, unspecified: Secondary | ICD-10-CM

## 2019-08-29 MED ORDER — PENICILLIN G BENZATHINE 1200000 UNIT/2ML IM SUSP
1.2000 10*6.[IU] | Freq: Once | INTRAMUSCULAR | Status: AC
  Administered 2019-08-29: 1.2 10*6.[IU] via INTRAMUSCULAR

## 2019-08-29 MED ORDER — PENICILLIN G BENZATHINE 1200000 UNIT/2ML IM SUSP: 2.4000 10*6.[IU] | Freq: Once | INTRAMUSCULAR | Status: DC

## 2019-08-29 MED ORDER — ALPRAZOLAM 0.25 MG PO TABS
0.2500 mg | ORAL_TABLET | Freq: Three times a day (TID) | ORAL | 0 refills | Status: DC | PRN
Start: 2019-08-29 — End: 2020-03-05

## 2019-08-29 NOTE — Progress Notes (Signed)
   Subjective:    Patient ID: Connor [name], male    DOB: 13-Jul-1985, 34 y.o.   MRN: [id number]  HPI 34yo M withhx of HIV/AIDS,Ulcerative colitis,was having difficulty with his ART previously ( headaches, trouble sleeping). Was changed to complera April 2013, then to triumeq January 2015 (due to food issues). Has received Hep B series x 2. At his f/u 11-2017, he disliked the triumeq (headaches). He was changed to dovato (this made him tired). At his last visit he had changed back to triumeq.  Due to fatigue, he was changed to [facility] 04-2018. His fatigue has been slightly better.   Has gotten COVID vax.  Down today as his father passed away.   HIV 1 RNA Quant (copies/mL)  Date Value  08/13/2019 <20 NOT DETECTED  05/07/2019 <20 NOT DETECTED  06/01/2018 <20 DETECTED (A)   CD4 T Cell Abs (/uL)  Date Value  08/13/2019 780  05/07/2019 929  06/01/2018 730     Review of Systems  Constitutional: Negative for appetite change and unexpected weight change.  Gastrointestinal: Negative for constipation and diarrhea.  Genitourinary: Negative for difficulty urinating.  Psychiatric/Behavioral: Positive for dysphoric mood. Negative for sleep disturbance.  Please see HPI. All other systems reviewed and negative.      Objective:   Physical Exam Constitutional:      Appearance: Normal appearance.  HENT:     Mouth/Throat:     Mouth: Mucous membranes are moist.     Pharynx: No oropharyngeal exudate.  Eyes:     Extraocular Movements: Extraocular movements intact.     Pupils: Pupils are equal, round, and reactive to light.  Cardiovascular:     Rate and Rhythm: Normal rate and regular rhythm.  Pulmonary:     Effort: Pulmonary effort is normal.     Breath sounds: Normal breath sounds.  Abdominal:     General: Bowel sounds are normal. There is no distension.     Palpations: Abdomen is soft.     Tenderness: There is no abdominal tenderness.  Musculoskeletal:     Cervical back:  Normal range of motion and neck supple.     Right lower leg: No edema.     Left lower leg: No edema.  Neurological:     General: No focal deficit present.     Mental Status: He is alert.  Psychiatric:        Mood and Affect: Mood is depressed.           Assessment & Plan:

## 2019-08-29 NOTE — Addendum Note (Signed)
Addended by: Landis Gandy on: 08/29/2019 04:51 PM   Modules accepted: Orders

## 2019-08-29 NOTE — Assessment & Plan Note (Signed)
He asks for something to help him get through the next week Will give him low dose of xanax.  He is clear that this is short term rx.

## 2019-08-29 NOTE — Assessment & Plan Note (Signed)
His RPR has further increased, he states he has had at risk behavior  Will give him Pen G 2.4 million u x 1.

## 2019-08-29 NOTE — Assessment & Plan Note (Signed)
He is doing well We discussed injectable Will send to pharm He is given condoms Will see him back in 6 months.

## 2019-09-05 MED FILL — ODEFSEY 200-25-25 MG TABS: 200-25-25 | 30 days supply | Qty: 30 | Fill #3

## 2019-09-09 ENCOUNTER — Telehealth: Payer: Self-pay | Admitting: Pharmacist

## 2019-09-09 NOTE — Telephone Encounter (Signed)
Faxed patient's Cabenuva enrollment form to [employer] today. They will assess patient's insurance and send a benefits investigation form detailing how to start the injections (where to get it from and cost). Once benefits investigation is complete, we will order oral lead-in therapy and start patient on treatment. Will update encounter with further details when available.

## 2019-09-23 ENCOUNTER — Telehealth: Payer: 59 | Admitting: Emergency Medicine

## 2019-09-23 DIAGNOSIS — L03111 Cellulitis of right axilla: Secondary | ICD-10-CM | POA: Diagnosis not present

## 2019-09-23 MED ORDER — DOXYCYCLINE HYCLATE 100 MG PO CAPS: 100.0000 mg | ORAL_CAPSULE | Freq: Two times a day (BID) | ORAL | 0 refills | Status: DC

## 2019-09-23 NOTE — Progress Notes (Signed)
E Visit for Cellulitis  Thank you for the additional information.  It was exactly what I needed.  We are sorry that you are not feeling well. Here is how we plan to help!  Based on what you shared with me it looks like you have cellulitis.  Cellulitis looks like areas of skin redness, swelling, and warmth; it develops as a result of bacteria entering under the skin. Little red spots and/or bleeding can be seen in skin, and tiny surface sacs containing fluid can occur. Fever can be present. Cellulitis is almost always on one side of a body, and the lower limbs are the most common site of involvement.   The particular location that you have the redness can easily develop into an abscess.  If you don't notice significant improvement in the next 2 days, you should be seen in person.  I have prescribed:  Doxycycline 113m to be taken every 12 hours for 10 days.  HOME CARE:  . Take your medications as ordered and take all of them, even if the skin irritation appears to be healing.   GET HELP RIGHT AWAY IF:  . Symptoms that don't begin to go away within 48 hours. . Severe redness persists or worsens . If the area turns color, spreads or swells. . If it blisters and opens, develops yellow-brown crust or bleeds. . You develop a fever or chills. . If the pain increases or becomes unbearable.  . Are unable to keep fluids and food down.  MAKE SURE YOU    Understand these instructions.  Will watch your condition.  Will get help right away if you are not doing well or get worse.  Thank you for choosing an e-visit. Your e-visit answers were reviewed by a board certified advanced clinical practitioner to complete your personal care plan. Depending upon the condition, your plan could have included both over the counter or prescription medications. Please review your pharmacy choice. Make sure the pharmacy is open so you can pick up prescription now. If there is a problem, you may contact your  provider through [employer] have the prescription routed to another pharmacy. Your safety is important to [place] If you have drug allergies check your prescription carefully.  For the next 24 hours you can use MyChart to ask questions about today's visit, request a non-urgent call back, or ask for a work or school excuse. You will get an email in the next two days asking about your experience. I hope that your e-visit has been valuable and will speed your recovery.   CD4 count >400.    Greater than 5 minutes, yet less than 10 minutes was used in reviewing the patient's chart, questionnaire, prescribing medications, and documentation for this visit.

## 2019-10-15 MED FILL — ODEFSEY 200-25-25 MG TABS: 200-25-25 | 30 days supply | Qty: 30 | Fill #4

## 2019-11-14 MED FILL — ODEFSEY 200-25-25 MG TABS: 200-25-25 | 30 days supply | Qty: 30 | Fill #5

## 2019-12-03 DIAGNOSIS — Z23 Encounter for immunization: Secondary | ICD-10-CM | POA: Diagnosis not present

## 2019-12-12 MED FILL — ODEFSEY 200-25-25 MG TABS: 200-25-25 | 30 days supply | Qty: 30 | Fill #6

## 2019-12-24 ENCOUNTER — Encounter: Payer: Self-pay | Admitting: Internal Medicine

## 2019-12-31 ENCOUNTER — Telehealth: Payer: 59 | Admitting: Physician Assistant

## 2019-12-31 DIAGNOSIS — M5442 Lumbago with sciatica, left side: Secondary | ICD-10-CM

## 2019-12-31 MED ORDER — PREDNISONE 10 MG (21) PO TBPK: ORAL_TABLET | Freq: Every day | ORAL | 0 refills | Status: DC

## 2019-12-31 MED ORDER — PREDNISONE 10 MG (21) PO TBPK
ORAL_TABLET | Freq: Every day | ORAL | 0 refills | Status: DC
Start: 2019-12-31 — End: 2019-12-31

## 2019-12-31 MED FILL — predniSONE 10 MG TABS: 10 | 12 days supply | Qty: 42 | Fill #0

## 2019-12-31 NOTE — Progress Notes (Signed)
We are sorry that you are not feeling well.  Here is how we plan to help!  Based on what you have shared with me it looks like you mostly have acute back pain.  Acute back pain is defined as musculoskeletal pain that can resolve in 1-3 weeks with conservative treatment.  I have prescribed a prednisone taper to help with your symptoms.    Back pain is very common.  The pain often gets better over time.  The cause of back pain is usually not dangerous.  Most people can learn to manage their back pain on their own.  Home Care  Stay active.  Start with short walks on flat ground if you can.  Try to walk farther each day.  Do not sit, drive or stand in one place for more than 30 minutes.  Do not stay in bed.  Do not avoid exercise or work.  Activity can help your back heal faster.  Be careful when you bend or lift an object.  Bend at your knees, keep the object close to you, and do not twist.  Sleep on a firm mattress.  Lie on your side, and bend your knees.  If you lie on your back, put a pillow under your knees.  Only take medicines as told by your doctor.  Put ice on the injured area.  Put ice in a plastic bag  Place a towel between your skin and the bag  Leave the ice on for 15-20 minutes, 3-4 times a day for the first 2-3 days. 210 After that, you can switch between ice and heat packs.  Ask your doctor about back exercises or massage.  Avoid feeling anxious or stressed.  Find good ways to deal with stress, such as exercise.  Get Help Right Way If:  Your pain does not go away with rest or medicine.  Your pain does not go away in 1 week.  You have new problems.  You do not feel well.  The pain spreads into your legs.  You cannot control when you poop (bowel movement) or pee (urinate)  You feel sick to your stomach (nauseous) or throw up (vomit)  You have belly (abdominal) pain.  You feel like you may pass out (faint).  If you develop a fever.  Make Sure  you:  Understand these instructions.  Will watch your condition  Will get help right away if you are not doing well or get worse.  Your e-visit answers were reviewed by a board certified advanced clinical practitioner to complete your personal care plan.  Depending on the condition, your plan could have included both over the counter or prescription medications.  If there is a problem please reply  once you have received a response from your provider.  Your safety is important to [place].  If you have drug allergies check your prescription carefully.    You can use MyChart to ask questions about today's visit, request a non-urgent call back, or ask for a work or school excuse for 24 hours related to this e-Visit. If it has been greater than 24 hours you will need to follow up with your provider, or enter a new e-Visit to address those concerns.  You will get an e-mail in the next two days asking about your experience.  I hope that your e-visit has been valuable and will speed your recovery. Thank you for using e-visits.  Approximately 5 minutes was spent documenting and reviewing patient's chart.

## 2019-12-31 NOTE — Addendum Note (Signed)
Addended by: Chevis Pretty on: 12/31/2019 04:59 PM   Modules accepted: Orders

## 2020-01-06 MED FILL — ODEFSEY 200-25-25 MG TABS: 200-25-25 | 30 days supply | Qty: 30 | Fill #7

## 2020-02-03 MED FILL — ODEFSEY 200-25-25 MG TABS: 200-25-25 | 30 days supply | Qty: 30 | Fill #8

## 2020-02-05 ENCOUNTER — Telehealth: Payer: Self-pay

## 2020-02-05 NOTE — Telephone Encounter (Signed)
My Chart [employer]

## 2020-02-17 ENCOUNTER — Other Ambulatory Visit: Payer: 59

## 2020-02-19 ENCOUNTER — Other Ambulatory Visit: Payer: 59

## 2020-02-19 ENCOUNTER — Other Ambulatory Visit: Payer: Self-pay

## 2020-02-19 DIAGNOSIS — B20 Human immunodeficiency virus [HIV] disease: Secondary | ICD-10-CM

## 2020-02-20 LAB — T-HELPER CELL (CD4) - (RCID CLINIC ONLY)
CD4 % Helper T Cell: 21 % — ABNORMAL LOW (ref 33–65)
CD4 T Cell Abs: 603 /uL (ref 400–1790)

## 2020-02-20 LAB — RPR: RPR Ser Ql: REACTIVE — AB

## 2020-02-20 LAB — RPR TITER: RPR Titer: 1:4 {titer} — ABNORMAL HIGH

## 2020-02-20 LAB — FLUORESCENT TREPONEMAL AB(FTA)-IGG-BLD: Fluorescent Treponemal ABS: REACTIVE — AB

## 2020-02-22 LAB — HIV-1 RNA QUANT-NO REFLEX-BLD
HIV 1 RNA Quant: 281 Copies/mL — ABNORMAL HIGH
HIV-1 RNA Quant, Log: 2.45 Log cps/mL — ABNORMAL HIGH

## 2020-03-03 ENCOUNTER — Encounter: Payer: 59 | Admitting: Infectious Diseases

## 2020-03-05 ENCOUNTER — Ambulatory Visit (INDEPENDENT_AMBULATORY_CARE_PROVIDER_SITE_OTHER): Payer: 59 | Admitting: Infectious Diseases

## 2020-03-05 ENCOUNTER — Other Ambulatory Visit: Payer: Self-pay | Admitting: Infectious Diseases

## 2020-03-05 ENCOUNTER — Other Ambulatory Visit: Payer: Self-pay

## 2020-03-05 ENCOUNTER — Encounter: Payer: Self-pay | Admitting: Infectious Diseases

## 2020-03-05 VITALS — BP 121/71 | HR 91 | Temp 98.7°F | Wt 248.0 lb

## 2020-03-05 DIAGNOSIS — B354 Tinea corporis: Secondary | ICD-10-CM | POA: Insufficient documentation

## 2020-03-05 DIAGNOSIS — F419 Anxiety disorder, unspecified: Secondary | ICD-10-CM | POA: Diagnosis not present

## 2020-03-05 DIAGNOSIS — Z79899 Other long term (current) drug therapy: Secondary | ICD-10-CM | POA: Diagnosis not present

## 2020-03-05 DIAGNOSIS — F32A Depression, unspecified: Secondary | ICD-10-CM | POA: Diagnosis not present

## 2020-03-05 DIAGNOSIS — B20 Human immunodeficiency virus [HIV] disease: Secondary | ICD-10-CM | POA: Diagnosis not present

## 2020-03-05 DIAGNOSIS — E669 Obesity, unspecified: Secondary | ICD-10-CM

## 2020-03-05 DIAGNOSIS — Z113 Encounter for screening for infections with a predominantly sexual mode of transmission: Secondary | ICD-10-CM | POA: Diagnosis not present

## 2020-03-05 MED ORDER — FLUCONAZOLE 100 MG PO TABS: 100.0000 mg | ORAL_TABLET | Freq: Every day | ORAL | 0 refills | Status: DC

## 2020-03-05 MED FILL — ODEFSEY 200-25-25 MG TABS: 200-25-25 | 30 days supply | Qty: 30 | Fill #9

## 2020-03-05 MED FILL — FLUCONAZOLE 100 MG TAB: 100 | 10 days supply | Qty: 10 | Fill #0

## 2020-03-05 NOTE — Assessment & Plan Note (Signed)
Will give him short course of fluconazole Check his A1C today.

## 2020-03-05 NOTE — Progress Notes (Signed)
   Subjective:    Patient ID: Connor [name], male    DOB: [birth year], 34 y.o.   MRN: [id number]  HPI 34yo M withhx of HIV/AIDS,Ulcerative colitis,was having difficulty with his ART previously ( headaches, trouble sleeping). Was changed to complera April 2013, then to triumeq January 2015 (due to food issues). Has received Hep B series x 2. At his f/u 11-2017, he disliked the triumeq (headaches). He was changed to dovato(this made him tired).At his last visit he had changed back to triumeq.  Due to fatigue, he was changed to [facility] 04-2018. Makes him tired, occas skips doses.  Has gotten COVID vax.  Has been feeling better (mood wise) than at prev visit.  Has not had recent UC flare.   HIV 1 RNA Quant  Date Value  02/19/2020 281 Copies/mL (H)  08/13/2019 <20 NOT DETECTED copies/mL  05/07/2019 <20 NOT DETECTED copies/mL   CD4 T Cell Abs (/uL)  Date Value  02/19/2020 603  08/13/2019 780  05/07/2019 929    Review of Systems  Constitutional: Negative for appetite change, chills, fever and unexpected weight change.  Respiratory: Negative for cough and shortness of breath.   Gastrointestinal: Negative for constipation and diarrhea.  Genitourinary: Negative for difficulty urinating.  Skin: Positive for rash (underneath breasts).  Psychiatric/Behavioral: Negative for sleep disturbance.  Please see HPI. All other systems reviewed and negative.      Objective:   Physical Exam Constitutional:      Appearance: Normal appearance. He is obese.  HENT:     Nose: Nose normal.     Mouth/Throat:     Mouth: Mucous membranes are moist.     Pharynx: No oropharyngeal exudate.  Eyes:     Extraocular Movements: Extraocular movements intact.     Pupils: Pupils are equal, round, and reactive to light.  Cardiovascular:     Rate and Rhythm: Normal rate and regular rhythm.  Pulmonary:     Effort: Pulmonary effort is normal.     Breath sounds: Normal breath sounds.  Abdominal:      General: Bowel sounds are normal. There is no distension.     Palpations: Abdomen is soft.     Tenderness: There is no abdominal tenderness.  Musculoskeletal:        General: Normal range of motion.     Cervical back: Normal range of motion and neck supple.     Right lower leg: No edema.     Left lower leg: No edema.  Neurological:     General: No focal deficit present.     Mental Status: He is alert.  Psychiatric:        Mood and Affect: Mood normal.           Assessment & Plan:

## 2020-03-05 NOTE — Assessment & Plan Note (Addendum)
He is doing well Has detectable virus. Will check genotype (VL maybe too low to quantify resistance).  He is given hand out for Cabaneuva, told him this would be unlikely to start til he is undetectable.  Offered/refused condoms.  Encouraged adherence.  Will call if resistance mutations.  O/w see back in 9 months.

## 2020-03-05 NOTE — Assessment & Plan Note (Signed)
He currently denies Will continue to monitor

## 2020-03-05 NOTE — Assessment & Plan Note (Signed)
Encouraged diet and exercise (he did not seem receptive)

## 2020-03-16 LAB — HIV-1 INTEGRASE GENOTYPE

## 2020-03-16 LAB — HIV-1 RNA ULTRAQUANT REFLEX TO GENTYP+
HIV 1 RNA Quant: 136 copies/mL — ABNORMAL HIGH
HIV-1 RNA Quant, Log: 2.13 Log copies/mL — ABNORMAL HIGH

## 2020-03-16 LAB — HEMOGLOBIN A1C
Hgb A1c MFr Bld: 5.9 % of total Hgb — ABNORMAL HIGH (ref <5.7)
Mean Plasma Glucose: 123 (calc)
eAG (mmol/L): 6.8 (calc)

## 2020-03-17 ENCOUNTER — Telehealth: Payer: Self-pay | Admitting: Infectious Diseases

## 2020-03-17 NOTE — Telephone Encounter (Signed)
Called pt and let him know resistance testing result.  Not able to be done due to low VL.  Will continue his current ART.

## 2020-03-22 ENCOUNTER — Other Ambulatory Visit (HOSPITAL_COMMUNITY): Payer: Self-pay | Admitting: Family

## 2020-03-23 MED FILL — ESCITALOPRAM 10 MG TABLET: 10 | 30 days supply | Qty: 30 | Fill #0

## 2020-04-09 MED FILL — ODEFSEY 200-25-25 MG TABS: 200-25-25 | 30 days supply | Qty: 30 | Fill #10

## 2020-05-06 ENCOUNTER — Other Ambulatory Visit (HOSPITAL_COMMUNITY): Payer: Self-pay | Admitting: Family

## 2020-05-06 MED FILL — ODEFSEY 200-25-25 MG TABS: 200-25-25 | 30 days supply | Qty: 30 | Fill #11

## 2020-05-07 MED FILL — ESCITALOPRAM 10 MG TABLET: 10 | 30 days supply | Qty: 30 | Fill #0

## 2020-05-11 ENCOUNTER — Other Ambulatory Visit: Payer: Self-pay | Admitting: Emergency Medicine

## 2020-05-11 ENCOUNTER — Telehealth: Payer: 59 | Admitting: Emergency Medicine

## 2020-05-11 DIAGNOSIS — L03119 Cellulitis of unspecified part of limb: Secondary | ICD-10-CM

## 2020-05-11 MED ORDER — DOXYCYCLINE HYCLATE 100 MG PO CAPS: 100.0000 mg | ORAL_CAPSULE | Freq: Two times a day (BID) | ORAL | 0 refills | Status: DC

## 2020-05-11 MED FILL — DOXYCYCLINE HYCLATE 100 MG: 100 | 10 days supply | Qty: 20 | Fill #0

## 2020-05-11 NOTE — Progress Notes (Signed)
E Visit for Cellulitis  We are sorry that you are not feeling well. Here is how we plan to help!  Based on what you shared with me it looks like you have cellulitis or a developing abscess.  We can trial an antibiotic for cellulitis, but if this worsens over the next 24-48 hours, you should be seen in person and evaluated for abscess.   Cellulitis looks like areas of skin redness, swelling, and warmth; it develops as a result of bacteria entering under the skin. Little red spots and/or bleeding can be seen in skin, and tiny surface sacs containing fluid can occur. Fever can be present. Cellulitis is almost always on one side of a body, and the lower limbs are the most common site of involvement.   I have prescribed:  Doxycycline 190m taken twice a day for 10 days.  HOME CARE:  . Take your medications as ordered and take all of them, even if the skin irritation appears to be healing.   GET HELP RIGHT AWAY IF:  . Symptoms that don't begin to go away within 48 hours. . Severe redness persists or worsens . If the area turns color, spreads or swells. . If it blisters and opens, develops yellow-brown crust or bleeds. . You develop a fever or chills. . If the pain increases or becomes unbearable.  . Are unable to keep fluids and food down.  MAKE SURE YOU    Understand these instructions.  Will watch your condition.  Will get help right away if you are not doing well or get worse.  Thank you for choosing an e-visit. Your e-visit answers were reviewed by a board certified advanced clinical practitioner to complete your personal care plan. Depending upon the condition, your plan could have included both over the counter or prescription medications. Please review your pharmacy choice. Make sure the pharmacy is open so you can pick up prescription now. If there is a problem, you may contact your provider through [employer]and have the prescription routed to another pharmacy. Your  safety is important to [place] If you have drug allergies check your prescription carefully.  For the next 24 hours you can use MyChart to ask questions about today's visit, request a non-urgent call back, or ask for a work or school excuse. You will get an email in the next two days asking about your experience. I hope that your e-visit has been valuable and will speed your recovery.  Approximately 5 minutes was used in reviewing the patient's chart, questionnaire, prescribing medications, and documentation.

## 2020-05-29 ENCOUNTER — Other Ambulatory Visit (HOSPITAL_COMMUNITY): Payer: Self-pay | Admitting: Family

## 2020-05-31 MED FILL — ESCITALOPRAM 10 MG TABLET: 10 | 30 days supply | Qty: 30 | Fill #0

## 2020-06-03 ENCOUNTER — Other Ambulatory Visit: Payer: Self-pay | Admitting: Infectious Diseases

## 2020-06-03 DIAGNOSIS — B20 Human immunodeficiency virus [HIV] disease: Secondary | ICD-10-CM

## 2020-06-03 MED FILL — ODEFSEY 200-25-25 MG TABS: 200-25-25 | 30 days supply | Qty: 30 | Fill #0

## 2020-07-06 ENCOUNTER — Telehealth: Payer: 59 | Admitting: Emergency Medicine

## 2020-07-06 ENCOUNTER — Other Ambulatory Visit: Payer: Self-pay | Admitting: Emergency Medicine

## 2020-07-06 DIAGNOSIS — J302 Other seasonal allergic rhinitis: Secondary | ICD-10-CM | POA: Diagnosis not present

## 2020-07-06 MED ORDER — LEVOCETIRIZINE DIHYDROCHLORIDE 5 MG PO TABS: 5.0000 mg | ORAL_TABLET | Freq: Every evening | ORAL | 0 refills | Status: DC

## 2020-07-06 MED ORDER — FLUTICASONE PROPIONATE 50 MCG/ACT NA SUSP: 2.0000 | Freq: Every day | NASAL | 0 refills | Status: DC

## 2020-07-06 MED FILL — LEVOCETIRIZINE 5 MG TABLET: 5 | 30 days supply | Qty: 30 | Fill #0

## 2020-07-06 NOTE — Progress Notes (Signed)
E visit for Allergic Rhinitis We are sorry that you are not feeling well.  Here is how we plan to help!  Based on what you have shared with me it looks like you have Allergic Rhinitis.  Rhinitis is when a reaction occurs that causes nasal congestion, runny nose, sneezing, and itching.  Most types of rhinitis are caused by an inflammation and are associated with symptoms in the eyes ears or throat. There are several types of rhinitis.  The most common are acute rhinitis, which is usually caused by a viral illness, allergic or seasonal rhinitis, and nonallergic or year-round rhinitis.  Nasal allergies occur certain times of the year.  Allergic rhinitis is caused when allergens in the air trigger the release of histamine in the body.  Histamine causes itching, swelling, and fluid to build up in the fragile linings of the nasal passages, sinuses and eyelids.  An itchy nose and clear discharge are common.  I recommend the following over the counter treatments, but have been sent in as prescription as these medications can sometimes be cheaper through insurance: Xyzal 5 mg take 1 tablet daily  I also would recommend a nasal spray: Flonase 2 sprays into each nostril once daily   HOME CARE:   You can use an over-the-counter saline nasal spray as needed  Avoid areas where there is heavy dust, mites, or molds  Stay indoors on windy days during the pollen season  Keep windows closed in home, at least in bedroom; use air conditioner.  Use high-efficiency house air filter  Keep windows closed in car, turn AC on re-circulate  Avoid playing out with dog during pollen season  GET HELP RIGHT AWAY IF:   If your symptoms do not improve within 10 days  You become short of breath  You develop yellow or green discharge from your nose for over 3 days  You have coughing fits  MAKE SURE YOU:   Understand these instructions  Will watch your condition  Will get help right away if you are not  doing well or get worse  Thank you for choosing an e-visit. Your e-visit answers were reviewed by a board certified advanced clinical practitioner to complete your personal care plan. Depending upon the condition, your plan could have included both over the counter or prescription medications. Please review your pharmacy choice. Be sure that the pharmacy you have chosen is open so that you can pick up your prescription now.  If there is a problem you may message your provider in [place] to have the prescription routed to another pharmacy. Your safety is important to [place]. If you have drug allergies check your prescription carefully.  For the next 24 hours, you can use MyChart to ask questions about today's visit, request a non-urgent call back, or ask for a work or school excuse from your e-visit provider. You will get an email in the next two days asking about your experience. I hope that your e-visit has been valuable and will speed your recovery.      Approximately 5 minutes was spent documenting and reviewing patient's chart.

## 2020-07-08 ENCOUNTER — Other Ambulatory Visit (HOSPITAL_COMMUNITY): Payer: Self-pay | Admitting: Family

## 2020-07-08 MED FILL — ESCITALOPRAM 10 MG TABLET: 10 | 30 days supply | Qty: 30 | Fill #0

## 2020-07-10 MED FILL — ODEFSEY 200-25-25 MG TABS: 200-25-25 | 30 days supply | Qty: 30 | Fill #1

## 2020-07-16 ENCOUNTER — Other Ambulatory Visit (HOSPITAL_COMMUNITY): Payer: Self-pay

## 2020-07-16 ENCOUNTER — Telehealth: Payer: 59 | Admitting: Emergency Medicine

## 2020-07-16 DIAGNOSIS — R197 Diarrhea, unspecified: Secondary | ICD-10-CM

## 2020-07-16 NOTE — Progress Notes (Signed)
Based on what you shared with me, I feel your condition warrants further evaluation and I recommend that you be seen for a face to face office visit.  It's unusual for diarrhea to be associated with such a high fever (such as you listed in the questionnaire).  This makes me concerned that you could have something more serious going on, such as diverticulitis.     NOTE: If you entered your credit card information for this eVisit, you will not be charged. You may see a "hold" on your card for the $35 but that hold will drop off and you will not have a charge processed.   If you are having a true medical emergency please call 911.      For an urgent face to face visit, [place] has five urgent care centers for your convenience:     [facility] at  Get Driving Directions [phone] [address] [place], [place] [address] . 10 am - 6pm Monday - Friday    [facility]) Get Driving Directions [phone] [address] [place], [place] [address] . 10 am to 8 pm Monday-Friday . 12 pm to 8 pm [facility] Get Driving Directions [phone] [place], [place], [place] [address] . 8 am to 8 pm Monday-Friday . 9 am to 6 pm Saturday . 11 am to 6 pm Sunday     [facility] at [facility] Get Driving Directions  [phone] [address] [place], [place] [address] . 8 am to 8 pm Monday-Friday . 8 am to 4 pm [facility] at [place] Get Driving Directions [phone] [address] [place], [place] [address] . 12 pm to 6 pm Monday-Friday      Your e-visit answers were reviewed by a board certified advanced clinical practitioner to complete your personal care plan.  Thank you for using e-Visits.    Approximately 5 minutes was used in reviewing the patient's chart,  questionnaire, prescribing medications, and documentation.

## 2020-07-29 ENCOUNTER — Other Ambulatory Visit (HOSPITAL_COMMUNITY): Payer: Self-pay

## 2020-08-06 ENCOUNTER — Other Ambulatory Visit (HOSPITAL_COMMUNITY): Payer: Self-pay

## 2020-08-06 MED FILL — Emtricitabine-Rilpivirine-Tenofovir AF Tab 200-25-25 MG: ORAL | 90 days supply | Qty: 90 | Fill #0 | Status: AC

## 2020-08-24 ENCOUNTER — Other Ambulatory Visit (HOSPITAL_COMMUNITY): Payer: Self-pay

## 2020-10-10 ENCOUNTER — Telehealth: Payer: BC Managed Care – PPO | Admitting: Orthopedic Surgery

## 2020-10-10 DIAGNOSIS — L03115 Cellulitis of right lower limb: Secondary | ICD-10-CM | POA: Diagnosis not present

## 2020-10-10 MED ORDER — SULFAMETHOXAZOLE-TRIMETHOPRIM 800-160 MG PO TABS: 1.0000 | ORAL_TABLET | Freq: Two times a day (BID) | ORAL | 0 refills | Status: AC

## 2020-10-10 NOTE — Progress Notes (Signed)
E Visit for Cellulitis  We are sorry that you are not feeling well. Here is how we plan to help!  Based on what you shared with me it looks like you have cellulitis.  Cellulitis looks like areas of skin redness, swelling, and warmth; it develops as a result of bacteria entering under the skin. Little red spots and/or bleeding can be seen in skin, and tiny surface sacs containing fluid can occur. Fever can be present. Cellulitis is almost always on one side of a body, and the lower limbs are the most common site of involvement.   I have prescribed:  Bactrim DS 1 tablet by mouth twice a day for 7 days  HOME CARE:  Take your medications as ordered and take all of them, even if the skin irritation appears to be healing.   GET HELP RIGHT AWAY IF:  Symptoms that don't begin to go away within 48 hours. Severe redness persists or worsens If the area turns color, spreads or swells. If it blisters and opens, develops yellow-brown crust or bleeds. You develop a fever or chills. If the pain increases or becomes unbearable.  Are unable to keep fluids and food down.  MAKE SURE YOU   Understand these instructions. Will watch your condition. Will get help right away if you are not doing well or get worse.  Thank you for choosing an e-visit.  Your e-visit answers were reviewed by a board certified advanced clinical practitioner to complete your personal care plan. Depending upon the condition, your plan could have included both over the counter or prescription medications.  Please review your pharmacy choice. Make sure the pharmacy is open so you can pick up prescription now. If there is a problem, you may contact your provider through [employer] and have the prescription routed to another pharmacy.  Your safety is important to [place]. If you have drug allergies check your prescription carefully.   For the next 24 hours you can use MyChart to ask questions about today's visit, request a  non-urgent call back, or ask for a work or school excuse. You will get an email in the next two days asking about your experience. I hope that your e-visit has been valuable and will speed your recovery.   Greater than 5 minutes, yet less than 10 minutes of time have been spent researching, coordinating and implementing care for this patient today.

## 2020-10-23 ENCOUNTER — Other Ambulatory Visit (HOSPITAL_COMMUNITY): Payer: Self-pay

## 2020-10-26 ENCOUNTER — Other Ambulatory Visit (HOSPITAL_COMMUNITY): Payer: Self-pay

## 2020-10-26 MED FILL — Emtricitabine-Rilpivirine-Tenofovir AF Tab 200-25-25 MG: ORAL | 30 days supply | Qty: 30 | Fill #1 | Status: AC

## 2020-10-27 ENCOUNTER — Other Ambulatory Visit (HOSPITAL_COMMUNITY): Payer: Self-pay

## 2020-11-03 ENCOUNTER — Other Ambulatory Visit (HOSPITAL_COMMUNITY): Payer: Self-pay

## 2020-11-16 ENCOUNTER — Other Ambulatory Visit: Payer: BC Managed Care – PPO

## 2020-11-16 ENCOUNTER — Other Ambulatory Visit (HOSPITAL_COMMUNITY)
Admission: RE | Admit: 2020-11-16 | Discharge: 2020-11-16 | Disposition: A | Discharge status: Home / Self Care | Payer: BC Managed Care – PPO | Source: Ambulatory Visit | Attending: Infectious Diseases | Admitting: Infectious Diseases

## 2020-11-16 ENCOUNTER — Other Ambulatory Visit: Payer: Self-pay

## 2020-11-16 DIAGNOSIS — Z113 Encounter for screening for infections with a predominantly sexual mode of transmission: Secondary | ICD-10-CM | POA: Diagnosis not present

## 2020-11-16 DIAGNOSIS — Z79899 Other long term (current) drug therapy: Secondary | ICD-10-CM

## 2020-11-16 DIAGNOSIS — B20 Human immunodeficiency virus [HIV] disease: Secondary | ICD-10-CM

## 2020-11-17 LAB — T-HELPER CELL (CD4) - (RCID CLINIC ONLY)
CD4 % Helper T Cell: 23 % — ABNORMAL LOW (ref 33–65)
CD4 T Cell Abs: 655 /uL (ref 400–1790)

## 2020-11-17 LAB — URINE CYTOLOGY ANCILLARY ONLY
Chlamydia: NEGATIVE
Comment: NEGATIVE
Comment: NORMAL
Neisseria Gonorrhea: NEGATIVE

## 2020-11-18 LAB — COMPREHENSIVE METABOLIC PANEL
AG Ratio: 1.6 (calc) (ref 1.0–2.5)
ALT: 9 U/L (ref 9–46)
AST: 14 U/L (ref 10–40)
Albumin: 4.2 g/dL (ref 3.6–5.1)
Alkaline phosphatase (APISO): 97 U/L (ref 36–130)
BUN: 11 mg/dL (ref 7–25)
CO2: 28 mmol/L (ref 20–32)
Calcium: 9.5 mg/dL (ref 8.6–10.3)
Chloride: 106 mmol/L (ref 98–110)
Creat: 0.95 mg/dL (ref 0.60–1.26)
Globulin: 2.7 g/dL (calc) (ref 1.9–3.7)
Glucose, Bld: 91 mg/dL (ref 65–99)
Potassium: 4.3 mmol/L (ref 3.5–5.3)
Sodium: 141 mmol/L (ref 135–146)
Total Bilirubin: 0.5 mg/dL (ref 0.2–1.2)
Total Protein: 6.9 g/dL (ref 6.1–8.1)

## 2020-11-18 LAB — CBC
HCT: 39.2 % (ref 38.5–50.0)
Hemoglobin: 12.8 g/dL — ABNORMAL LOW (ref 13.2–17.1)
MCH: 28.9 pg (ref 27.0–33.0)
MCHC: 32.7 g/dL (ref 32.0–36.0)
MCV: 88.5 fL (ref 80.0–100.0)
MPV: 11.8 fL (ref 7.5–12.5)
Platelets: 214 10*3/uL (ref 140–400)
RBC: 4.43 10*6/uL (ref 4.20–5.80)
RDW: 12.9 % (ref 11.0–15.0)
WBC: 9.7 10*3/uL (ref 3.8–10.8)

## 2020-11-18 LAB — LIPID PANEL
Cholesterol: 173 mg/dL (ref <200)
HDL: 43 mg/dL (ref >=40)
LDL Cholesterol (Calc): 111 mg/dL (calc) — ABNORMAL HIGH
Non-HDL Cholesterol (Calc): 130 mg/dL (calc) — ABNORMAL HIGH (ref <130)
Total CHOL/HDL Ratio: 4 (calc) (ref <5.0)
Triglycerides: 90 mg/dL (ref <150)

## 2020-11-18 LAB — FLUORESCENT TREPONEMAL AB(FTA)-IGG-BLD: Fluorescent Treponemal ABS: REACTIVE — AB

## 2020-11-18 LAB — RPR TITER: RPR Titer: 1:4 {titer} — ABNORMAL HIGH

## 2020-11-18 LAB — RPR: RPR Ser Ql: REACTIVE — AB

## 2020-11-18 LAB — HIV-1 RNA QUANT-NO REFLEX-BLD
HIV 1 RNA Quant: NOT DETECTED Copies/mL
HIV-1 RNA Quant, Log: NOT DETECTED Log cps/mL

## 2020-11-27 ENCOUNTER — Other Ambulatory Visit (HOSPITAL_COMMUNITY): Payer: Self-pay

## 2020-11-30 ENCOUNTER — Other Ambulatory Visit (HOSPITAL_COMMUNITY): Payer: Self-pay

## 2020-12-01 ENCOUNTER — Encounter: Payer: 59 | Admitting: Infectious Diseases

## 2020-12-07 ENCOUNTER — Ambulatory Visit (INDEPENDENT_AMBULATORY_CARE_PROVIDER_SITE_OTHER): Payer: BC Managed Care – PPO | Admitting: Infectious Disease

## 2020-12-07 ENCOUNTER — Telehealth: Payer: Self-pay

## 2020-12-07 ENCOUNTER — Other Ambulatory Visit: Payer: Self-pay

## 2020-12-07 ENCOUNTER — Other Ambulatory Visit (HOSPITAL_COMMUNITY): Payer: Self-pay

## 2020-12-07 ENCOUNTER — Encounter: Payer: Self-pay | Admitting: Infectious Disease

## 2020-12-07 VITALS — BP 128/78 | HR 74 | Temp 98.4°F | Wt 249.8 lb

## 2020-12-07 DIAGNOSIS — E669 Obesity, unspecified: Secondary | ICD-10-CM | POA: Diagnosis not present

## 2020-12-07 DIAGNOSIS — E559 Vitamin D deficiency, unspecified: Secondary | ICD-10-CM

## 2020-12-07 DIAGNOSIS — F419 Anxiety disorder, unspecified: Secondary | ICD-10-CM

## 2020-12-07 DIAGNOSIS — R5382 Chronic fatigue, unspecified: Secondary | ICD-10-CM

## 2020-12-07 DIAGNOSIS — K219 Gastro-esophageal reflux disease without esophagitis: Secondary | ICD-10-CM | POA: Diagnosis not present

## 2020-12-07 DIAGNOSIS — B20 Human immunodeficiency virus [HIV] disease: Secondary | ICD-10-CM | POA: Diagnosis not present

## 2020-12-07 DIAGNOSIS — F32A Depression, unspecified: Secondary | ICD-10-CM

## 2020-12-07 DIAGNOSIS — K5130 Ulcerative (chronic) rectosigmoiditis without complications: Secondary | ICD-10-CM

## 2020-12-07 DIAGNOSIS — R7303 Prediabetes: Secondary | ICD-10-CM

## 2020-12-07 MED ORDER — DORAVIRINE 100 MG PO TABS
100.0000 mg | ORAL_TABLET | Freq: Every day | ORAL | 11 refills | Status: DC
  Filled 2020-12-07 (×2): qty 30, 30d supply, fill #0
  Filled 2021-01-11: qty 30, 30d supply, fill #1

## 2020-12-07 MED ORDER — EMTRICITABINE-TENOFOVIR AF 200-25 MG PO TABS
1.0000 | ORAL_TABLET | Freq: Every day | ORAL | 11 refills | Status: DC
  Filled 2020-12-07 (×2): qty 30, 30d supply, fill #0
  Filled 2021-01-11: qty 30, 30d supply, fill #1

## 2020-12-07 NOTE — Telephone Encounter (Signed)
[facility] Patient Advocate Encounter   Was successful in obtaining a [facility] copay card for Pifletro.  This copay card will make the patients copay 0.00.  I have spoken with the patient.    The billing information is as follows and has been shared with [place].  RxBin: [id number] PCN: [id number] Member ID: [id number] Group ID: [id number]  Ileene Patrick, [place] Patient [place] [facility] Phone: [phone] Fax:  [phone]

## 2020-12-07 NOTE — Progress Notes (Signed)
Subjective:    Patient ID: Connor [name], male    DOB: 1986/04/07, 35 y.o.   MRN: [id number] Chief complaint: Feeling fatigued on Odefsey  HPI  Connor [name] is a 35 year old black man living with HIV that is been relatively well controlled but who has had difficulty with side effects on various antiretroviral therapies.  He had previously been on Complera and then was changed over to [facility] but did not tolerate this well due to headaches.  Then he was changed over to [facility] and was having trouble sleeping throughout the day.  He then was changed back to oral [place] based regimen with Odefsey.  He still feels fairly fatigued on Odefsey but tolerates this better than prior regimens.  Unfortunately he suffers some gastroesophageal reflux disease and takes medications to treat this from time to time.  He was interested in long-acting therapy, Cabenuva and still is.   Past Medical History:  Diagnosis Date   Anxiety    Depression    History of syphilis    HIV infection ([place])    Ulcerative colitis ([place])    Vitamin D deficiency     No past surgical history on file.  Family History  Problem Relation Age of Onset   Hypertension Mother    Alcohol abuse Father    Hypertension Father    Diabetes Maternal Grandmother    Stroke Paternal Grandfather    Cancer Neg Hx       Social History   Socioeconomic History   Marital status: Single    Spouse name: Not on file   Number of children: Not on file   Years of education: Not on file   Highest education level: Not on file  Occupational History   Not on file  Tobacco Use   Smoking status: Never   Smokeless tobacco: Never  Vaping Use   Vaping Use: Never used  Substance and Sexual Activity   Alcohol use: No    Alcohol/week: 0.0 standard drinks   Drug use: No   Sexual activity: Yes    Partners: Female, Male    Birth control/protection: Condom    Comment: refused condoms  Other Topics Concern   Not on file  Social History  Narrative   Not on file   Social Determinants of Health   Financial Resource Strain: Not on file  Food Insecurity: Not on file  Transportation Needs: Not on file  Physical Activity: Not on file  Stress: Not on file  Social Connections: Not on file    No Known Allergies   Current Outpatient Medications:    Cholecalciferol (VITAMIN D3) 125 MCG (5000 UT) CAPS, Take by mouth., Disp: , Rfl:    doxycycline (VIBRAMYCIN) 100 MG capsule, TAKE 1 CAPSULE (100 MG TOTAL) BY MOUTH 2 (TWO) TIMES DAILY., Disp: 20 capsule, Rfl: 0   emtricitabine-rilpivir-tenofovir AF (ODEFSEY) 200-25-25 MG TABS tablet, TAKE 1 TABLET BY MOUTH DAILY WITH BREAKFAST., Disp: 90 tablet, Rfl: 1   escitalopram (LEXAPRO) 10 MG tablet, TAKE 1 TABLET BY MOUTH AT BEDTIME, Disp: 30 tablet, Rfl: 0   escitalopram (LEXAPRO) 10 MG tablet, TAKE 1 TABLET BY MOUTH AT BEDTIME, Disp: 30 tablet, Rfl: 0   escitalopram (LEXAPRO) 10 MG tablet, TAKE 1 TABLET BY MOUTH AT BEDTIME., Disp: 30 tablet, Rfl: 0   escitalopram (LEXAPRO) 10 MG tablet, TAKE 1 TABLET BY MOUTH AT BEDTIME, Disp: 30 tablet, Rfl: 0   fluconazole (DIFLUCAN) 100 MG tablet, TAKE 1 TABLET BY MOUTH DAILY., Disp: 10 tablet, Rfl: 0  fluticasone (FLONASE) 50 MCG/ACT nasal spray, Place 2 sprays into both nostrils daily., Disp: 16 g, Rfl: 0   levocetirizine (XYZAL) 5 MG tablet, TAKE 1 TABLET BY MOUTH ONCE DAILY EACH EVENING, Disp: 30 tablet, Rfl: 0   omeprazole (PRILOSEC) 40 MG capsule, Take 1 capsule (40 mg total) by mouth daily., Disp: 30 capsule, Rfl: 5   zolpidem (AMBIEN CR) 6.25 MG CR tablet, Take 1 tablet (6.25 mg total) by mouth at bedtime as needed for sleep. (Patient not taking: Reported on 03/05/2020), Disp: 15 tablet, Rfl: 0   Review of Systems  Constitutional:  Positive for fatigue. Negative for activity change, appetite change, chills, diaphoresis, fever and unexpected weight change.  HENT:  Negative for congestion, rhinorrhea, sinus pressure, sneezing, sore throat and  trouble swallowing.   Eyes:  Negative for photophobia and visual disturbance.  Respiratory:  Negative for cough, chest tightness, shortness of breath, wheezing and stridor.   Cardiovascular:  Negative for chest pain, palpitations and leg swelling.  Gastrointestinal:  Negative for abdominal distention, abdominal pain, anal bleeding, blood in stool, constipation, diarrhea, nausea and vomiting.  Genitourinary:  Negative for difficulty urinating, dysuria, flank pain and hematuria.  Musculoskeletal:  Negative for arthralgias, back pain, gait problem, joint swelling and myalgias.  Skin:  Negative for color change, pallor, rash and wound.  Neurological:  Negative for dizziness, tremors, weakness and light-headedness.  Hematological:  Negative for adenopathy. Does not bruise/bleed easily.  Psychiatric/Behavioral:  Negative for agitation, behavioral problems, confusion, decreased concentration, dysphoric mood and sleep disturbance.       Objective:   Physical Exam Constitutional:      Appearance: He is well-developed.  HENT:     Head: Normocephalic and atraumatic.  Eyes:     Conjunctiva/sclera: Conjunctivae normal.  Cardiovascular:     Rate and Rhythm: Normal rate and regular rhythm.  Pulmonary:     Effort: Pulmonary effort is normal. No respiratory distress.     Breath sounds: No wheezing.  Abdominal:     General: There is no distension.     Palpations: Abdomen is soft.  Musculoskeletal:        General: No tenderness. Normal range of motion.     Cervical back: Normal range of motion and neck supple.  Skin:    General: Skin is warm and dry.     Coloration: Skin is not pale.     Findings: No erythema or rash.  Neurological:     General: No focal deficit present.     Mental Status: He is alert and oriented to person, place, and time.  Psychiatric:        Mood and Affect: Mood normal.        Behavior: Behavior normal.        Thought Content: Thought content normal.        Judgment:  Judgment normal.          Assessment & Plan:   HIV disease:  I reviewed his viral load from August 1 which was not detected and his CD4 count which was 655.  Well I do think Cabenuva be a good option for him in terms of being on one of the drugs he is currently on and circumventing the issue with the GI tract and RPV Cabenuva does contain Cabotegravir if he truly has had trouble with TRIUMEQ and Dovato I would expect he might have issues with his long-acting integrase strand transfer inhibitor.  Instead I proposed to switch within class II Pifeltro and DESCOVY.  I have sent prescriptions for both of these medications to [facility] has activated co-pay cards from [place] respectively.  Will bring him back in 1 month's time to recheck his labs and follow-up with Dr. Johnnye Sima or if is not available I can see him then 2.  Fatigue: He attributes this to his antiretrovirals would seem strange to have this with multiple different classes of agents if is really the case and it happens on Pifeltro as well could consider switching him to boosted protease inhibitor  Prediabetes: weight loss would move him further out of this range  GERD: taking him off RPV will allow him to take PPI  Anxiety and depression: Stable but not on his SSRI at this point  Ulcerative colitis: Does not appear to be on medications for this  Vitamin D deficiency not on vitamin D anymore we will check level at next lab draw.

## 2020-12-31 ENCOUNTER — Other Ambulatory Visit (HOSPITAL_COMMUNITY): Payer: Self-pay

## 2021-01-11 ENCOUNTER — Other Ambulatory Visit (HOSPITAL_COMMUNITY): Payer: Self-pay

## 2021-01-11 ENCOUNTER — Ambulatory Visit: Payer: BC Managed Care – PPO

## 2021-01-11 ENCOUNTER — Other Ambulatory Visit: Payer: BC Managed Care – PPO

## 2021-01-11 ENCOUNTER — Other Ambulatory Visit: Payer: Self-pay

## 2021-01-11 ENCOUNTER — Other Ambulatory Visit: Payer: Self-pay | Admitting: Infectious Disease

## 2021-01-11 ENCOUNTER — Ambulatory Visit (INDEPENDENT_AMBULATORY_CARE_PROVIDER_SITE_OTHER): Payer: BC Managed Care – PPO

## 2021-01-11 DIAGNOSIS — B20 Human immunodeficiency virus [HIV] disease: Secondary | ICD-10-CM

## 2021-01-11 DIAGNOSIS — Z23 Encounter for immunization: Secondary | ICD-10-CM | POA: Diagnosis not present

## 2021-01-11 DIAGNOSIS — E559 Vitamin D deficiency, unspecified: Secondary | ICD-10-CM

## 2021-01-11 NOTE — Progress Notes (Signed)
[facility]  Name:  Connor [name]    MRN: [id number] DOB: [birth year]   01/11/2021  [name] was observed post JYNNEOS immunization for 15 minutes without incident. He was provided with Vaccine Information Sheet and instruction to access the V-Safe system.   [name] was instructed to call 911 with any severe reactions post vaccine: Difficulty breathing  Swelling of face and throat  A fast heartbeat  A bad rash all over body  Dizziness and weakness     Mahalia Dykes T Pricilla Loveless

## 2021-01-12 LAB — T-HELPER CELL (CD4) - (RCID CLINIC ONLY)
CD4 % Helper T Cell: 24 % — ABNORMAL LOW (ref 33–65)
CD4 T Cell Abs: 713 /uL (ref 400–1790)

## 2021-01-14 LAB — QUESTASSURED™ 25-HYDROXY AND 1,25-DIHYDROXYVITAMIN D
Vitamin D 1, 25 (OH)2 Total: 45 pg/mL (ref 18–72)
Vitamin D, 25-OH, D2: <4 ng/mL
Vitamin D, 25-OH, D3: 86 ng/mL
Vitamin D, 25-OH, Total: 86 ng/mL (ref 30–100)
Vitamin D2 1, 25 (OH)2: <8 pg/mL
Vitamin D3 1, 25 (OH)2: 45 pg/mL

## 2021-01-14 LAB — COMPLETE METABOLIC PANEL WITH GFR
AG Ratio: 1.5 (calc) (ref 1.0–2.5)
ALT: 7 U/L — ABNORMAL LOW (ref 9–46)
AST: 13 U/L (ref 10–40)
Albumin: 4.2 g/dL (ref 3.6–5.1)
Alkaline phosphatase (APISO): 104 U/L (ref 36–130)
BUN: 10 mg/dL (ref 7–25)
CO2: 29 mmol/L (ref 20–32)
Calcium: 9.5 mg/dL (ref 8.6–10.3)
Chloride: 106 mmol/L (ref 98–110)
Creat: 1.11 mg/dL (ref 0.60–1.26)
Globulin: 2.8 g/dL (calc) (ref 1.9–3.7)
Glucose, Bld: 116 mg/dL — ABNORMAL HIGH (ref 65–99)
Potassium: 4.2 mmol/L (ref 3.5–5.3)
Sodium: 141 mmol/L (ref 135–146)
Total Bilirubin: 0.3 mg/dL (ref 0.2–1.2)
Total Protein: 7 g/dL (ref 6.1–8.1)
eGFR: 88 mL/min/{1.73_m2} (ref >=60)

## 2021-01-14 LAB — HIV-1 RNA QUANT-NO REFLEX-BLD
HIV 1 RNA Quant: <20 Copies/mL — ABNORMAL HIGH
HIV-1 RNA Quant, Log: <1.3 Log cps/mL — ABNORMAL HIGH

## 2021-01-14 LAB — CBC WITH DIFFERENTIAL/PLATELET
Absolute Monocytes: 634 cells/uL (ref 200–950)
Basophils Absolute: 50 cells/uL (ref 0–200)
Basophils Relative: 0.5 %
Eosinophils Absolute: 228 cells/uL (ref 15–500)
Eosinophils Relative: 2.3 %
HCT: 38.6 % (ref 38.5–50.0)
Hemoglobin: 12.6 g/dL — ABNORMAL LOW (ref 13.2–17.1)
Lymphs Abs: 3049 cells/uL (ref 850–3900)
MCH: 29 pg (ref 27.0–33.0)
MCHC: 32.6 g/dL (ref 32.0–36.0)
MCV: 88.9 fL (ref 80.0–100.0)
MPV: 12 fL (ref 7.5–12.5)
Monocytes Relative: 6.4 %
Neutro Abs: 5940 cells/uL (ref 1500–7800)
Neutrophils Relative %: 60 %
Platelets: 228 10*3/uL (ref 140–400)
RBC: 4.34 10*6/uL (ref 4.20–5.80)
RDW: 12.7 % (ref 11.0–15.0)
Total Lymphocyte: 30.8 %
WBC: 9.9 10*3/uL (ref 3.8–10.8)

## 2021-01-16 ENCOUNTER — Telehealth: Payer: BC Managed Care – PPO | Admitting: Nurse Practitioner

## 2021-01-16 DIAGNOSIS — J029 Acute pharyngitis, unspecified: Secondary | ICD-10-CM | POA: Diagnosis not present

## 2021-01-16 NOTE — Progress Notes (Signed)
  E-Visit for Sore Throat  We are sorry that you are not feeling well.  Here is how we plan to help!  Your symptoms indicate a likely viral infection (Pharyngitis).   Pharyngitis is inflammation in the back of the throat which can cause a sore throat, scratchiness and sometimes difficulty swallowing.   Pharyngitis is typically caused by a respiratory virus and will just run its course.  Please keep in mind that your symptoms could last up to 10 days.  For throat pain, we recommend over the counter oral pain relief medications such as acetaminophen or aspirin, or anti-inflammatory medications such as ibuprofen or naproxen sodium.  Topical treatments such as oral throat lozenges or sprays may be used as needed.  Avoid close contact with loved ones, especially the very young and elderly.  Remember to wash your hands thoroughly throughout the day as this is the number one way to prevent the spread of infection and wipe down door knobs and counters with disinfectant.  After careful review of your answers, I would not recommend and antibiotic for your condition.  Antibiotics should not be used to treat conditions that we suspect are caused by viruses like the virus that causes the common cold or flu. However, some people can have Strep with atypical symptoms. You may need formal testing in clinic or office to confirm if your symptoms continue or worsen.  Providers prescribe antibiotics to treat infections caused by bacteria. Antibiotics are very powerful in treating bacterial infections when they are used properly.  To maintain their effectiveness, they should be used only when necessary.  Overuse of antibiotics has resulted in the development of super bugs that are resistant to treatment!    Home Care: Only take medications as instructed by your medical team. Do not drink alcohol while taking these medications. A steam or ultrasonic humidifier can help congestion.  You can place a towel over your head and  breathe in the steam from hot water coming from a faucet. Avoid close contacts especially the very young and the elderly. Cover your mouth when you cough or sneeze. Always remember to wash your hands.  Get Help Right Away If: You develop worsening fever or throat pain. You develop a severe head ache or visual changes. Your symptoms persist after you have completed your treatment plan.  Make sure you Understand these instructions. Will watch your condition. Will get help right away if you are not doing well or get worse.   Thank you for choosing an e-visit.  Your e-visit answers were reviewed by a board certified advanced clinical practitioner to complete your personal care plan. Depending upon the condition, your plan could have included both over the counter or prescription medications.  Please review your pharmacy choice. Make sure the pharmacy is open so you can pick up prescription now. If there is a problem, you may contact your provider through [employer] and have the prescription routed to another pharmacy.  Your safety is important to [place]. If you have drug allergies check your prescription carefully.   For the next 24 hours you can use MyChart to ask questions about today's visit, request a non-urgent call back, or ask for a work or school excuse. You will get an email in the next two days asking about your experience. I hope that your e-visit has been valuable and will speed your recovery.  5-10 minutes spent reviewing and documenting in chart.

## 2021-01-18 ENCOUNTER — Telehealth: Payer: Self-pay

## 2021-01-18 ENCOUNTER — Ambulatory Visit (INDEPENDENT_AMBULATORY_CARE_PROVIDER_SITE_OTHER): Payer: BC Managed Care – PPO | Admitting: Infectious Disease

## 2021-01-18 ENCOUNTER — Other Ambulatory Visit (HOSPITAL_COMMUNITY): Payer: Self-pay

## 2021-01-18 ENCOUNTER — Other Ambulatory Visit: Payer: Self-pay

## 2021-01-18 ENCOUNTER — Encounter: Payer: Self-pay | Admitting: Pharmacist

## 2021-01-18 ENCOUNTER — Encounter: Payer: Self-pay | Admitting: Infectious Disease

## 2021-01-18 VITALS — BP 124/81 | HR 86 | Temp 98.6°F | Wt 251.0 lb

## 2021-01-18 DIAGNOSIS — R059 Cough, unspecified: Secondary | ICD-10-CM | POA: Insufficient documentation

## 2021-01-18 DIAGNOSIS — B20 Human immunodeficiency virus [HIV] disease: Secondary | ICD-10-CM | POA: Diagnosis not present

## 2021-01-18 DIAGNOSIS — F5101 Primary insomnia: Secondary | ICD-10-CM | POA: Diagnosis not present

## 2021-01-18 DIAGNOSIS — E559 Vitamin D deficiency, unspecified: Secondary | ICD-10-CM

## 2021-01-18 DIAGNOSIS — K219 Gastro-esophageal reflux disease without esophagitis: Secondary | ICD-10-CM

## 2021-01-18 DIAGNOSIS — J069 Acute upper respiratory infection, unspecified: Secondary | ICD-10-CM

## 2021-01-18 DIAGNOSIS — F419 Anxiety disorder, unspecified: Secondary | ICD-10-CM

## 2021-01-18 DIAGNOSIS — F32A Depression, unspecified: Secondary | ICD-10-CM

## 2021-01-18 DIAGNOSIS — J029 Acute pharyngitis, unspecified: Secondary | ICD-10-CM

## 2021-01-18 DIAGNOSIS — R051 Acute cough: Secondary | ICD-10-CM

## 2021-01-18 DIAGNOSIS — R7303 Prediabetes: Secondary | ICD-10-CM

## 2021-01-18 DIAGNOSIS — E669 Obesity, unspecified: Secondary | ICD-10-CM

## 2021-01-18 DIAGNOSIS — K5130 Ulcerative (chronic) rectosigmoiditis without complications: Secondary | ICD-10-CM

## 2021-01-18 HISTORY — DX: Acute pharyngitis, unspecified: J02.9

## 2021-01-18 HISTORY — DX: Cough, unspecified: R05.9

## 2021-01-18 HISTORY — DX: Acute upper respiratory infection, unspecified: J06.9

## 2021-01-18 NOTE — Progress Notes (Signed)
Subjective:    Patient ID: Connor [name], male    DOB: [birth year], 35 y.o.   MRN: [id number] Chief complaints: Sore throat malaise and productive cough with green phlegm  HPI  Connor [name] is a 35 year old black man living with HIV that is been relatively well controlled but who has had difficulty with side effects on various antiretroviral therapies.  He had previously been on Complera and then was changed over to [facility] but did not tolerate this well due to headaches.  Then he was changed over to [facility] and was having trouble sleeping throughout the day.   He was then changed to based regimen with [facility].  He still feels fairly fatigued on Odefsey but tolerates this better than prior regimens.  He then developed gastroesophageal reflux disease and we switched him over to [facility] and DESCOVY.  He tolerates these medications well but he does not like having to remember them.  He has a similar issue with trying to remember to take vitamin D supplementation.  He recently developed onset last Monday of a sore throat with some malaise body aches and congestion and cough that is now producing some green phlegm.  He tested negative for COVID via rapid test once last week but has not been tested since then a woman who lives with him has similar symptoms I suspect they have a viral upper respiratory tract infection possibly COVID-19 possibly flu or possibly another virus.     Past Medical History:  Diagnosis Date   Anxiety    Depression    History of syphilis    HIV infection ([place])    Ulcerative colitis ([place])    Vitamin D deficiency     No past surgical history on file.  Family History  Problem Relation Age of Onset   Hypertension Mother    Alcohol abuse Father    Hypertension Father    Diabetes Maternal Grandmother    Stroke Paternal Grandfather    Cancer Neg Hx       Social History   Socioeconomic History   Marital status: Single    Spouse name: Not on file   Number  of children: Not on file   Years of education: Not on file   Highest education level: Not on file  Occupational History   Not on file  Tobacco Use   Smoking status: Never   Smokeless tobacco: Never  Vaping Use   Vaping Use: Never used  Substance and Sexual Activity   Alcohol use: No    Alcohol/week: 0.0 standard drinks   Drug use: No   Sexual activity: Yes    Partners: Female, Male    Birth control/protection: Condom    Comment: refused condoms  Other Topics Concern   Not on file  Social History Narrative   Not on file   Social Determinants of Health   Financial Resource Strain: Not on file  Food Insecurity: Not on file  Transportation Needs: Not on file  Physical Activity: Not on file  Stress: Not on file  Social Connections: Not on file    No Known Allergies   Current Outpatient Medications:    doravirine (PIFELTRO) 100 MG TABS tablet, Take 1 tablet (100 mg total) by mouth daily., Disp: 30 tablet, Rfl: 11   emtricitabine-tenofovir AF (DESCOVY) 200-25 MG tablet, Take 1 tablet by mouth daily., Disp: 30 tablet, Rfl: 11   fluticasone (FLONASE) 50 MCG/ACT nasal spray, Place 2 sprays into both nostrils daily. (Patient not taking: Reported on 12/07/2020),  Disp: 16 g, Rfl: 0   omeprazole (PRILOSEC) 40 MG capsule, Take 1 capsule (40 mg total) by mouth daily., Disp: 30 capsule, Rfl: 5   Review of Systems  Constitutional:  Negative for activity change, appetite change, chills, diaphoresis, fatigue, fever and unexpected weight change.  HENT:  Positive for sore throat. Negative for congestion, rhinorrhea, sinus pressure, sneezing and trouble swallowing.   Eyes:  Negative for photophobia and visual disturbance.  Respiratory:  Positive for cough. Negative for chest tightness, shortness of breath, wheezing and stridor.   Cardiovascular:  Negative for chest pain, palpitations and leg swelling.  Gastrointestinal:  Negative for abdominal distention, abdominal pain, anal bleeding, blood  in stool, constipation, diarrhea, nausea and vomiting.  Genitourinary:  Negative for difficulty urinating, dysuria, flank pain and hematuria.  Musculoskeletal:  Positive for myalgias. Negative for arthralgias, back pain, gait problem and joint swelling.  Skin:  Negative for color change, pallor, rash and wound.  Neurological:  Negative for dizziness, tremors, weakness and light-headedness.  Hematological:  Negative for adenopathy. Does not bruise/bleed easily.  Psychiatric/Behavioral:  Negative for agitation, behavioral problems, confusion, decreased concentration, dysphoric mood and sleep disturbance.       Objective:   Physical Exam Constitutional:      Appearance: He is well-developed.  HENT:     Head: Normocephalic and atraumatic.     Mouth/Throat:     Lips: Pink.     Mouth: Mucous membranes are moist.     Dentition: Normal dentition.     Tongue: No lesions. Tongue does not deviate from midline.     Pharynx: Posterior oropharyngeal erythema present. No pharyngeal swelling, oropharyngeal exudate or uvula swelling.  Eyes:     General:        Right eye: No discharge.        Left eye: No discharge.     Extraocular Movements: Extraocular movements intact.     Conjunctiva/sclera: Conjunctivae normal.  Cardiovascular:     Rate and Rhythm: Normal rate and regular rhythm.     Heart sounds: No murmur heard.   No friction rub. No gallop.  Pulmonary:     Effort: Pulmonary effort is normal. No respiratory distress.     Breath sounds: No stridor. No wheezing or rhonchi.  Abdominal:     General: There is no distension.     Palpations: Abdomen is soft.  Musculoskeletal:        General: No tenderness. Normal range of motion.     Cervical back: Normal range of motion and neck supple.  Skin:    General: Skin is warm and dry.     Coloration: Skin is not pale.     Findings: No erythema or rash.  Neurological:     General: No focal deficit present.     Mental Status: He is alert and  oriented to person, place, and time.  Psychiatric:        Mood and Affect: Mood normal.        Behavior: Behavior normal.        Thought Content: Thought content normal.        Judgment: Judgment normal.          Assessment & Plan:    Sore throat congestion cough: Suspect this is a viral respiratory tract infection:  Counseled him to get tested for COVID-19 again.  This point is too far out of the window for antiviral to be effective.  I have counseled him to use over-the-counter medications including a  stronger cough suppressant by [facility] and asking for a codeine-based prescription which 1 can get there with a driver's license and without a prescription.  HIV disease: For now continue Pifeltro and DESCOVY  Reviewed his viral load which is less than 20 on September 26 and his CD4 count which is 713 the same day.  He is very much interested in going on to [name] and he accepts the 1% risk of virological failure and the risk the INSTI could cause him sleep problems (the latter he says is more of an issue of excessive sleepiness)  Ileene Patrick to work on prior authorization.  Vitamin D deficiency could take 50K unitis per week I have counseled him  Obesity: try  carbohydrate restriction  UC: followed by GI  Vaccine counseling: got flu shot, needs to find out if has COVID now prior to getting bivalent booster

## 2021-01-18 NOTE — Telephone Encounter (Signed)
[facility] Patient Advocate Encounter  Connor [name] is covered under the patient medical benefits. Patient will have a $50.00 office visit copay.  PA is not required for [facility]  REF # [id number] Patient is enrolled in [place], [place] Patient [facility] Phone: [phone] Fax:  [phone]

## 2021-01-18 NOTE — Telephone Encounter (Signed)
I have him on the schedule

## 2021-01-18 NOTE — Telephone Encounter (Signed)
FYI - see patient message. Thanks!

## 2021-02-08 ENCOUNTER — Other Ambulatory Visit: Payer: Self-pay

## 2021-02-08 ENCOUNTER — Ambulatory Visit: Payer: BC Managed Care – PPO

## 2021-02-08 ENCOUNTER — Ambulatory Visit (INDEPENDENT_AMBULATORY_CARE_PROVIDER_SITE_OTHER): Payer: BC Managed Care – PPO | Admitting: Pharmacist

## 2021-02-08 DIAGNOSIS — B20 Human immunodeficiency virus [HIV] disease: Secondary | ICD-10-CM | POA: Diagnosis not present

## 2021-02-08 MED ORDER — CABOTEGRAVIR & RILPIVIRINE ER 600 & 900 MG/3ML IM SUER
1.0000 | Freq: Once | INTRAMUSCULAR | Status: AC
  Administered 2021-02-08: 1 via INTRAMUSCULAR

## 2021-02-08 MED ORDER — CABOTEGRAVIR & RILPIVIRINE ER 600 & 900 MG/3ML IM SUER: 1.0000 | INTRAMUSCULAR | 5 refills | Status: DC

## 2021-02-08 NOTE — Progress Notes (Signed)
HPI: Connor [name] is a 35 y.o. male who presents to the [facility] for [place] administration.  Patient Active Problem List   Diagnosis Date Noted   Upper respiratory infection, acute 01/18/2021   Sore throat 01/18/2021   Cough 01/18/2021   Tinea corporis 03/05/2020   Prediabetes 09/13/2018   GERD (gastroesophageal reflux disease) 09/13/2018   Obesity (BMI 30-39.9) 06/01/2018   Vitamin D deficiency 05/16/2016   Anxiety and depression 05/16/2016   Insomnia 06/28/2011   Human immunodeficiency virus (HIV) disease ([place]) 07/13/2009   Ulcerative colitis ([place]) 07/06/2009    Patient's Medications  New Prescriptions   No medications on file  Previous Medications   DORAVIRINE (PIFELTRO) 100 MG TABS TABLET    Take 1 tablet (100 mg total) by mouth daily.   EMTRICITABINE-TENOFOVIR AF (DESCOVY) 200-25 MG TABLET    Take 1 tablet by mouth daily.   FLUTICASONE (FLONASE) 50 MCG/ACT NASAL SPRAY    Place 2 sprays into both nostrils daily.   OMEPRAZOLE (PRILOSEC) 40 MG CAPSULE    Take 1 capsule (40 mg total) by mouth daily.  Modified Medications   No medications on file  Discontinued Medications   No medications on file    Allergies: No Known Allergies  Past Medical History: Past Medical History:  Diagnosis Date   Anxiety    Cough 01/18/2021   Depression    History of syphilis    HIV infection ([place])    Sore throat 01/18/2021   Ulcerative colitis ([place])    Upper respiratory infection, acute 01/18/2021   Vitamin D deficiency     Social History: Social History   Socioeconomic History   Marital status: Single    Spouse name: Not on file   Number of children: Not on file   Years of education: Not on file   Highest education level: Not on file  Occupational History   Not on file  Tobacco Use   Smoking status: Never   Smokeless tobacco: Never  Vaping Use   Vaping Use: Never used  Substance and Sexual Activity   Alcohol use: No    Alcohol/week: 0.0 standard  drinks   Drug use: No   Sexual activity: Yes    Partners: Female, Male    Birth control/protection: Condom    Comment: refused condoms  Other Topics Concern   Not on file  Social History Narrative   Not on file   Social Determinants of Health   Financial Resource Strain: Not on file  Food Insecurity: Not on file  Transportation Needs: Not on file  Physical Activity: Not on file  Stress: Not on file  Social Connections: Not on file    Labs: Lab Results  Component Value Date   HIV1RNAQUANT <20 (H) 01/11/2021   HIV1RNAQUANT Not Detected 11/16/2020   HIV1RNAQUANT 136 (H) 03/05/2020   CD4TABS 713 01/11/2021   CD4TABS 655 11/16/2020   CD4TABS 603 02/19/2020    RPR and STI Lab Results  Component Value Date   LABRPR REACTIVE (A) 11/16/2020   LABRPR REACTIVE (A) 02/19/2020   LABRPR REACTIVE (A) 08/13/2019   LABRPR REACTIVE (A) 06/01/2018   LABRPR REACTIVE (A) 11/20/2017   RPRTITER 1:4 (H) 11/16/2020   RPRTITER 1:4 (H) 02/19/2020   RPRTITER 1:16 (H) 08/13/2019   RPRTITER 1:8 (H) 06/01/2018   RPRTITER 1:8 (H) 11/20/2017    STI Results GC CT  11/16/2020 Negative Negative  05/07/2019 Negative Negative  11/20/2017 Negative Negative  07/19/2016 Negative Negative  09/28/2015 Negative Negative  Hepatitis B Lab Results  Component Value Date   HEPBSAB INDETER (A) 07/13/2009   HEPBSAG NEG 07/13/2009   HEPBCAB NEG 07/13/2009   Hepatitis C No results found for: HEPCAB, HCVRNAPCRQN Hepatitis A Lab Results  Component Value Date   HAV POS (A) 07/13/2009   Lipids: Lab Results  Component Value Date   CHOL 173 11/16/2020   TRIG 90 11/16/2020   HDL 43 11/16/2020   CHOLHDL 4.0 11/16/2020   VLDL 14.2 07/27/2017   LDLCALC 111 (H) 11/16/2020    Current HIV Regimen: Pifeltro and Descovy  TARGET DATE: 24th of the month  Assessment: Connor [name] presents today for their first initiation injection for Cabenuva. Counseled that [name] is two separate intramuscular injections in  the gluteal muscle on each side for each visit. Explained that the second injection is 30 days after the initial injection then every 2 months thereafter. Discussed the need for viral load monitoring every 2 months for the first 6 months and then periodically afterwards as their provider sees the need. Discussed the rare but significant chance of developing resistance despite compliance. Explained that showing up to injection appointments is very important and warned that if 2 appointments are missed, it will be reassessed by their provider whether they are a good candidate for injection therapy. Counseled on possible side effects associated with the injections such as injection site pain, which is usually mild to moderate in nature, injection site nodules, and injection site reactions. Asked to call the clinic or send me a mychart message if they experience any issues, such as fatigue, nausea, headache, rash, or dizziness. Advised that they can take ibuprofen or tylenol for injection site pain if needed.   Administered cabotegravir 646m/3mL in left upper outer quadrant of the gluteal muscle. Administered rilpivirine 900 mg/329min the right upper outer quadrant of the gluteal muscle. Monitored patient for 10 minutes after injection. Injections were tolerated well without issue. Counseled to stop taking Pifeltro and Descovy after today's dose and to call with any issues that may arise. Will make follow up appointments for second initiation injection in 30 days and then maintenance injections every 2 months thereafter.   Plan: - Stop Pifeltro and Descovy after today's doses - First Cabenuva injections administered - Second initiation injection scheduled for 03/08/21 with Cassie - Maintenance injections scheduled for 05/10/21 with Cassie - Call with any issues or questions   MaRebbeca PaulPharmD PGY2 Ambulatory Care Pharmacy Resident 02/08/2021 11:28 AM

## 2021-03-08 ENCOUNTER — Other Ambulatory Visit (HOSPITAL_COMMUNITY)
Admission: RE | Admit: 2021-03-08 | Discharge: 2021-03-08 | Disposition: A | Discharge status: Home / Self Care | Payer: BC Managed Care – PPO | Source: Ambulatory Visit | Attending: Infectious Disease | Admitting: Infectious Disease

## 2021-03-08 ENCOUNTER — Ambulatory Visit (INDEPENDENT_AMBULATORY_CARE_PROVIDER_SITE_OTHER): Payer: BC Managed Care – PPO | Admitting: Pharmacist

## 2021-03-08 ENCOUNTER — Other Ambulatory Visit: Payer: Self-pay

## 2021-03-08 DIAGNOSIS — B20 Human immunodeficiency virus [HIV] disease: Secondary | ICD-10-CM

## 2021-03-08 DIAGNOSIS — Z113 Encounter for screening for infections with a predominantly sexual mode of transmission: Secondary | ICD-10-CM | POA: Insufficient documentation

## 2021-03-08 MED ORDER — CABOTEGRAVIR & RILPIVIRINE ER 600 & 900 MG/3ML IM SUER
1.0000 | Freq: Once | INTRAMUSCULAR | Status: AC
  Administered 2021-03-08: 1 via INTRAMUSCULAR

## 2021-03-08 NOTE — Progress Notes (Signed)
HPI: Connor [name] is a 35 y.o. male who presents to the [facility] for [place] administration.  Patient Active Problem List   Diagnosis Date Noted   Upper respiratory infection, acute 01/18/2021   Sore throat 01/18/2021   Cough 01/18/2021   Tinea corporis 03/05/2020   Prediabetes 09/13/2018   GERD (gastroesophageal reflux disease) 09/13/2018   Obesity (BMI 30-39.9) 06/01/2018   Vitamin D deficiency 05/16/2016   Anxiety and depression 05/16/2016   Insomnia 06/28/2011   Human immunodeficiency virus (HIV) disease ([place]) 07/13/2009   Ulcerative colitis ([place]) 07/06/2009    Patient's Medications  New Prescriptions   No medications on file  Previous Medications   CABOTEGRAVIR & RILPIVIRINE ER (CABENUVA) 600 & 900 MG/3ML INJECTION    Inject 1 kit into the muscle every 2 (two) months.   FLUTICASONE (FLONASE) 50 MCG/ACT NASAL SPRAY    Place 2 sprays into both nostrils daily.   OMEPRAZOLE (PRILOSEC) 40 MG CAPSULE    Take 1 capsule (40 mg total) by mouth daily.  Modified Medications   No medications on file  Discontinued Medications   No medications on file    Allergies: No Known Allergies  Past Medical History: Past Medical History:  Diagnosis Date   Anxiety    Cough 01/18/2021   Depression    History of syphilis    HIV infection ([place])    Sore throat 01/18/2021   Ulcerative colitis ([place])    Upper respiratory infection, acute 01/18/2021   Vitamin D deficiency     Social History: Social History   Socioeconomic History   Marital status: Single    Spouse name: Not on file   Number of children: Not on file   Years of education: Not on file   Highest education level: Not on file  Occupational History   Not on file  Tobacco Use   Smoking status: Never   Smokeless tobacco: Never  Vaping Use   Vaping Use: Never used  Substance and Sexual Activity   Alcohol use: No    Alcohol/week: 0.0 standard drinks   Drug use: No   Sexual activity: Yes    Partners:  Female, Male    Birth control/protection: Condom    Comment: refused condoms  Other Topics Concern   Not on file  Social History Narrative   Not on file   Social Determinants of Health   Financial Resource Strain: Not on file  Food Insecurity: Not on file  Transportation Needs: Not on file  Physical Activity: Not on file  Stress: Not on file  Social Connections: Not on file    Labs: Lab Results  Component Value Date   HIV1RNAQUANT <20 (H) 01/11/2021   HIV1RNAQUANT Not Detected 11/16/2020   HIV1RNAQUANT 136 (H) 03/05/2020   CD4TABS 713 01/11/2021   CD4TABS 655 11/16/2020   CD4TABS 603 02/19/2020    RPR and STI Lab Results  Component Value Date   LABRPR REACTIVE (A) 11/16/2020   LABRPR REACTIVE (A) 02/19/2020   LABRPR REACTIVE (A) 08/13/2019   LABRPR REACTIVE (A) 06/01/2018   LABRPR REACTIVE (A) 11/20/2017   RPRTITER 1:4 (H) 11/16/2020   RPRTITER 1:4 (H) 02/19/2020   RPRTITER 1:16 (H) 08/13/2019   RPRTITER 1:8 (H) 06/01/2018   RPRTITER 1:8 (H) 11/20/2017    STI Results GC CT  11/16/2020 Negative Negative  05/07/2019 Negative Negative  11/20/2017 Negative Negative  07/19/2016 Negative Negative  09/28/2015 Negative Negative    Hepatitis B Lab Results  Component Value Date  HEPBSAB INDETER (A) 07/13/2009   HEPBSAG NEG 07/13/2009   HEPBCAB NEG 07/13/2009   Hepatitis C No results found for: HEPCAB, HCVRNAPCRQN Hepatitis A Lab Results  Component Value Date   HAV POS (A) 07/13/2009   Lipids: Lab Results  Component Value Date   CHOL 173 11/16/2020   TRIG 90 11/16/2020   HDL 43 11/16/2020   CHOLHDL 4.0 11/16/2020   VLDL 14.2 07/27/2017   LDLCALC 111 (H) 11/16/2020    TARGET DATE: 24th of the month  Assessment: Connor [name] presents today for their maintenance Cabenuva injections. Initial/past injections were tolerated well without issues. No problems with systemic or localized side effects of injections.  Administered cabotegravir 67m/3mL in left upper  outer quadrant of the gluteal muscle. Administered rilpivirine 900 mg/375min the right upper outer quadrant of the gluteal muscle. Monitored patient for 10 minutes after injection. Injections were tolerated well without issue. Patient will follow up in 2 months for next injection.  Hepatitis B surface antibody was last checked in 2011 and it was indeterminate. He received one hepatitis B vaccine since then but his surface antibody hasn't been checked since then.   Plan:  - Cabenuva injections administered  - Next injections scheduled for 05/10/21 with Cassie, 07/12/21 with Dr. VaTommy Medal5/22/23 with Cassie  - Check HIV RNA, hepatitis B surface antibody, urine chlamydia/gonorrhea cytology - Call with any issues or questions   MaRebbeca PaulPharmD PGY2 Ambulatory Care Pharmacy Resident 03/08/2021 9:25 AM

## 2021-03-09 LAB — URINE CYTOLOGY ANCILLARY ONLY
Chlamydia: NEGATIVE
Comment: NEGATIVE
Comment: NORMAL
Neisseria Gonorrhea: NEGATIVE

## 2021-03-10 LAB — HEPATITIS B SURFACE ANTIBODY, QUANTITATIVE: Hep B S AB Quant (Post): 6 m[IU]/mL — ABNORMAL LOW (ref >=10)

## 2021-03-10 LAB — HIV-1 RNA QUANT-NO REFLEX-BLD
HIV 1 RNA Quant: <20 Copies/mL — ABNORMAL HIGH
HIV-1 RNA Quant, Log: <1.3 Log cps/mL — ABNORMAL HIGH

## 2021-05-10 ENCOUNTER — Ambulatory Visit (INDEPENDENT_AMBULATORY_CARE_PROVIDER_SITE_OTHER): Payer: BC Managed Care – PPO | Admitting: Pharmacist

## 2021-05-10 ENCOUNTER — Other Ambulatory Visit: Payer: Self-pay

## 2021-05-10 DIAGNOSIS — B20 Human immunodeficiency virus [HIV] disease: Secondary | ICD-10-CM

## 2021-05-10 DIAGNOSIS — Z23 Encounter for immunization: Secondary | ICD-10-CM | POA: Diagnosis not present

## 2021-05-10 DIAGNOSIS — E559 Vitamin D deficiency, unspecified: Secondary | ICD-10-CM

## 2021-05-10 MED ORDER — CABOTEGRAVIR & RILPIVIRINE ER 600 & 900 MG/3ML IM SUER
1.0000 | Freq: Once | INTRAMUSCULAR | Status: AC
  Administered 2021-05-10: 1 via INTRAMUSCULAR

## 2021-05-10 NOTE — Progress Notes (Signed)
HPI: Connor [name] is a 36 y.o. male who presents to the [facility] for [place] administration.  Patient Active Problem List   Diagnosis Date Noted   Upper respiratory infection, acute 01/18/2021   Sore throat 01/18/2021   Cough 01/18/2021   Tinea corporis 03/05/2020   Prediabetes 09/13/2018   GERD (gastroesophageal reflux disease) 09/13/2018   Obesity (BMI 30-39.9) 06/01/2018   Vitamin D deficiency 05/16/2016   Anxiety and depression 05/16/2016   Insomnia 06/28/2011   Human immunodeficiency virus (HIV) disease ([place]) 07/13/2009   Ulcerative colitis ([place]) 07/06/2009    Patient's Medications  New Prescriptions   No medications on file  Previous Medications   CABOTEGRAVIR & RILPIVIRINE ER (CABENUVA) 600 & 900 MG/3ML INJECTION    Inject 1 kit into the muscle every 2 (two) months.   FLUTICASONE (FLONASE) 50 MCG/ACT NASAL SPRAY    Place 2 sprays into both nostrils daily.   OMEPRAZOLE (PRILOSEC) 40 MG CAPSULE    Take 1 capsule (40 mg total) by mouth daily.  Modified Medications   No medications on file  Discontinued Medications   No medications on file    Allergies: No Known Allergies  Past Medical History: Past Medical History:  Diagnosis Date   Anxiety    Cough 01/18/2021   Depression    History of syphilis    HIV infection ([place])    Sore throat 01/18/2021   Ulcerative colitis ([place])    Upper respiratory infection, acute 01/18/2021   Vitamin D deficiency     Social History: Social History   Socioeconomic History   Marital status: Single    Spouse name: Not on file   Number of children: Not on file   Years of education: Not on file   Highest education level: Not on file  Occupational History   Not on file  Tobacco Use   Smoking status: Never   Smokeless tobacco: Never  Vaping Use   Vaping Use: Never used  Substance and Sexual Activity   Alcohol use: No    Alcohol/week: 0.0 standard drinks   Drug use: No   Sexual activity: Yes    Partners:  Female, Male    Birth control/protection: Condom    Comment: refused condoms  Other Topics Concern   Not on file  Social History Narrative   Not on file   Social Determinants of Health   Financial Resource Strain: Not on file  Food Insecurity: Not on file  Transportation Needs: Not on file  Physical Activity: Not on file  Stress: Not on file  Social Connections: Not on file    Labs: Lab Results  Component Value Date   HIV1RNAQUANT <20 (H) 03/08/2021   HIV1RNAQUANT <20 (H) 01/11/2021   HIV1RNAQUANT Not Detected 11/16/2020   CD4TABS 713 01/11/2021   CD4TABS 655 11/16/2020   CD4TABS 603 02/19/2020    RPR and STI Lab Results  Component Value Date   LABRPR REACTIVE (A) 11/16/2020   LABRPR REACTIVE (A) 02/19/2020   LABRPR REACTIVE (A) 08/13/2019   LABRPR REACTIVE (A) 06/01/2018   LABRPR REACTIVE (A) 11/20/2017   RPRTITER 1:4 (H) 11/16/2020   RPRTITER 1:4 (H) 02/19/2020   RPRTITER 1:16 (H) 08/13/2019   RPRTITER 1:8 (H) 06/01/2018   RPRTITER 1:8 (H) 11/20/2017    STI Results GC CT  03/08/2021 Negative Negative  11/16/2020 Negative Negative  05/07/2019 Negative Negative  11/20/2017 Negative Negative  07/19/2016 Negative Negative  09/28/2015 Negative Negative    Hepatitis B Lab Results  Component Value Date   HEPBSAB INDETER (A) 07/13/2009   HEPBSAG NEG 07/13/2009   HEPBCAB NEG 07/13/2009   Hepatitis C No results found for: HEPCAB, HCVRNAPCRQN Hepatitis A Lab Results  Component Value Date   HAV POS (A) 07/13/2009   Lipids: Lab Results  Component Value Date   CHOL 173 11/16/2020   TRIG 90 11/16/2020   HDL 43 11/16/2020   CHOLHDL 4.0 11/16/2020   VLDL 14.2 07/27/2017   LDLCALC 111 (H) 11/16/2020    TARGET DATE: The 24th of the month  Assessment: Connor [name] presents today for their maintenance Cabenuva injections. Initial/past injections were tolerated well without issues. No problems with systemic side effects of injections. Patient did experience a fever of  102F the day after the last injection but resolved within a day with ibuprofen, no other adverse effects. Will check HIV RNA and RPR today with history of syphilis. Also check vitamin D per patient request. HepB titer did not show immunity, will revaccinate with Heplisav series.  Administered cabotegravir 631m/3mL in left upper outer quadrant of the gluteal muscle. Administered rilpivirine 900 mg/3109min the right upper outer quadrant of the gluteal muscle. Monitored patient for 10 minutes after injection. Injections were tolerated well without issue. Patient will follow up in 2 months for next set of injections.  Plan: - Cabenuva injections administered - Administered Heplisav B vaccine - Check HIV RNA, RPR, vitamin D - Next injections scheduled for 07/12/21 with Dr. VaTommy Medaldue for 2nd hepB vaccine at this visit - Call with any issues or questions  YuMichela PitcherChWestonPharmD Student

## 2021-05-12 LAB — RPR TITER: RPR Titer: 1:4 {titer} — ABNORMAL HIGH

## 2021-05-12 LAB — FLUORESCENT TREPONEMAL AB(FTA)-IGG-BLD: Fluorescent Treponemal ABS: REACTIVE — AB

## 2021-05-12 LAB — VITAMIN D 25 HYDROXY (VIT D DEFICIENCY, FRACTURES): Vit D, 25-Hydroxy: 63 ng/mL (ref 30–100)

## 2021-05-12 LAB — HIV-1 RNA QUANT-NO REFLEX-BLD
HIV 1 RNA Quant: 22 Copies/mL — ABNORMAL HIGH
HIV-1 RNA Quant, Log: 1.34 Log cps/mL — ABNORMAL HIGH

## 2021-05-12 LAB — RPR: RPR Ser Ql: REACTIVE — AB

## 2021-07-12 ENCOUNTER — Ambulatory Visit: Payer: BC Managed Care – PPO | Admitting: Infectious Disease

## 2021-07-14 ENCOUNTER — Ambulatory Visit (INDEPENDENT_AMBULATORY_CARE_PROVIDER_SITE_OTHER): Payer: BC Managed Care – PPO | Admitting: Pharmacist

## 2021-07-14 ENCOUNTER — Other Ambulatory Visit: Payer: Self-pay

## 2021-07-14 DIAGNOSIS — Z23 Encounter for immunization: Secondary | ICD-10-CM | POA: Diagnosis not present

## 2021-07-14 DIAGNOSIS — B20 Human immunodeficiency virus [HIV] disease: Secondary | ICD-10-CM | POA: Diagnosis not present

## 2021-07-14 MED ORDER — CABOTEGRAVIR & RILPIVIRINE ER 600 & 900 MG/3ML IM SUER
1.0000 | Freq: Once | INTRAMUSCULAR | Status: AC
  Administered 2021-07-14: 1 via INTRAMUSCULAR

## 2021-07-14 NOTE — Progress Notes (Signed)
? ?HPI: Connor [name] is a 36 y.o. male who presents to the [facility] for [place] administration. ? ?Patient Active Problem List  ? Diagnosis Date Noted  ? Upper respiratory infection, acute 01/18/2021  ? Sore throat 01/18/2021  ? Cough 01/18/2021  ? Tinea corporis 03/05/2020  ? Prediabetes 09/13/2018  ? GERD (gastroesophageal reflux disease) 09/13/2018  ? Obesity (BMI 30-39.9) 06/01/2018  ? Vitamin D deficiency 05/16/2016  ? Anxiety and depression 05/16/2016  ? Insomnia 06/28/2011  ? Human immunodeficiency virus (HIV) disease ([place]) 07/13/2009  ? Ulcerative colitis ([place]) 07/06/2009  ? ? ?Patient's Medications  ?New Prescriptions  ? No medications on file  ?Previous Medications  ? CABOTEGRAVIR & RILPIVIRINE ER (CABENUVA) 600 & 900 MG/3ML INJECTION    Inject 1 kit into the muscle every 2 (two) months.  ? FLUTICASONE (FLONASE) 50 MCG/ACT NASAL SPRAY    Place 2 sprays into both nostrils daily.  ? OMEPRAZOLE (PRILOSEC) 40 MG CAPSULE    Take 1 capsule (40 mg total) by mouth daily.  ?Modified Medications  ? No medications on file  ?Discontinued Medications  ? No medications on file  ? ? ?Allergies: ?No Known Allergies ? ?Past Medical History: ?Past Medical History:  ?Diagnosis Date  ? Anxiety   ? Cough 01/18/2021  ? Depression   ? History of syphilis   ? HIV infection ([place])   ? Sore throat 01/18/2021  ? Ulcerative colitis ([place])   ? Upper respiratory infection, acute 01/18/2021  ? Vitamin D deficiency   ? ? ?Social History: ?Social History  ? ?Socioeconomic History  ? Marital status: Single  ?  Spouse name: Not on file  ? Number of children: Not on file  ? Years of education: Not on file  ? Highest education level: Not on file  ?Occupational History  ? Not on file  ?Tobacco Use  ? Smoking status: Never  ? Smokeless tobacco: Never  ?Vaping Use  ? Vaping Use: Never used  ?Substance and Sexual Activity  ? Alcohol use: No  ?  Alcohol/week: 0.0 standard drinks  ? Drug use: No  ? Sexual activity: Yes  ?  Partners:  Female, Male  ?  Birth control/protection: Condom  ?  Comment: refused condoms  ?Other Topics Concern  ? Not on file  ?Social History Narrative  ? Not on file  ? ?Social Determinants of Health  ? ?Financial Resource Strain: Not on file  ?Food Insecurity: Not on file  ?Transportation Needs: Not on file  ?Physical Activity: Not on file  ?Stress: Not on file  ?Social Connections: Not on file  ? ? ?Labs: ?Lab Results  ?Component Value Date  ? HIV1RNAQUANT 22 (H) 05/10/2021  ? HIV1RNAQUANT <20 (H) 03/08/2021  ? HIV1RNAQUANT <20 (H) 01/11/2021  ? CD4TABS 713 01/11/2021  ? CD4TABS 655 11/16/2020  ? CD4TABS 603 02/19/2020  ? ? ?RPR and STI ?Lab Results  ?Component Value Date  ? LABRPR REACTIVE (A) 05/10/2021  ? LABRPR REACTIVE (A) 11/16/2020  ? LABRPR REACTIVE (A) 02/19/2020  ? LABRPR REACTIVE (A) 08/13/2019  ? LABRPR REACTIVE (A) 06/01/2018  ? RPRTITER 1:4 (H) 05/10/2021  ? RPRTITER 1:4 (H) 11/16/2020  ? RPRTITER 1:4 (H) 02/19/2020  ? RPRTITER 1:16 (H) 08/13/2019  ? RPRTITER 1:8 (H) 06/01/2018  ? ? ?STI Results GC CT  ?03/08/2021 ? 9:16 AM Negative   Negative    ?11/16/2020 ?10:53 AM Negative   Negative    ?05/07/2019 ? 3:08 PM Negative   Negative    ?  11/20/2017 ?12:00 AM Negative   Negative    ?07/19/2016 ?12:00 AM Negative   Negative    ?09/28/2015 ?12:00 AM Negative   Negative    ? ? ?Hepatitis B ?Lab Results  ?Component Value Date  ? HEPBSAB INDETER (A) 07/13/2009  ? HEPBSAG NEG 07/13/2009  ? HEPBCAB NEG 07/13/2009  ? ?Hepatitis C ?No results found for: [place], HCVRNAPCRQN ?Hepatitis A ?Lab Results  ?Component Value Date  ? HAV POS (A) 07/13/2009  ? ?Lipids: ?Lab Results  ?Component Value Date  ? CHOL 173 11/16/2020  ? TRIG 90 11/16/2020  ? HDL 43 11/16/2020  ? CHOLHDL 4.0 11/16/2020  ? VLDL 14.2 07/27/2017  ? LDLCALC 111 (H) 11/16/2020  ? ? ?TARGET DATE:  The 24th of the month ? ?Current HIV Regimen: ?Cabenuva q41mo ? ?Assessment: ?SKayzenpresents today for their maintenance Cabenuva injections. Initial injection was  tolerated well without issues. At his last visit, he did notice that he had a low grade fever of ~100F that lasted ~1 day and resolved after taking ibuprofen. This was likely due to patient receiving a Hep B vaccine. Asked patient to call [place]if develops a high grade fever. Will get HIV RNA and second Hep B vaccine today. Patient is also agreeable to receiving HPV vaccine today.  ? ?Administered cabotegravir 6010m53mL in left upper outer quadrant of the gluteal muscle. Administered rilpivirine 900 mg/53m453mn the right upper outer quadrant of the gluteal muscle. Monitored patient for 10 minutes after injection. Injections were tolerated well without issue. Patient will follow up in 2 months for next injection. ? ?Patient has not had any new sexual encounters since last visit. Last RPR was 05/10/21 and last urine cytology was 03/08/21. Will defer RPR and cytologies today.  ? ?Discussed the results of his Vitamin D level obtained last visit and how it resulted as normal. He reports that he has a history of vitamin D deficiency and has had levels as low as 10 ng/mL. He is not currently on supplementation. With a normal vitamin D level while not on supplementation, do not recommend any supplementation at this time. ? ?Plan: ?- Cabenuva injections administered ?- Next injections scheduled for 09/06/21 and 11/08/21 with Cassie ?- HIV RNA ?- Hep B and HPV vaccine ?- Second HPV vaccine at next visit and third HPV vaccine in 6 months ?- Call with any issues or questions ? ?RacJoseph Artharm.D. ?PGY-1 Pharmacy Resident ?07/14/2021 10:43 AM ?

## 2021-07-18 LAB — HIV-1 RNA QUANT-NO REFLEX-BLD
HIV 1 RNA Quant: NOT DETECTED Copies/mL
HIV-1 RNA Quant, Log: NOT DETECTED Log cps/mL

## 2021-08-23 ENCOUNTER — Other Ambulatory Visit (HOSPITAL_COMMUNITY): Payer: Self-pay

## 2021-08-23 ENCOUNTER — Encounter: Payer: Self-pay | Admitting: Infectious Disease

## 2021-08-24 ENCOUNTER — Other Ambulatory Visit (HOSPITAL_COMMUNITY): Payer: Self-pay

## 2021-08-24 MED ORDER — FLUOXETINE HCL 10 MG PO CAPS
ORAL_CAPSULE | ORAL | 0 refills | Status: DC
  Filled 2021-08-24: qty 30, 30d supply, fill #0

## 2021-09-06 ENCOUNTER — Other Ambulatory Visit: Payer: Self-pay

## 2021-09-06 ENCOUNTER — Other Ambulatory Visit (HOSPITAL_COMMUNITY)
Admission: RE | Admit: 2021-09-06 | Discharge: 2021-09-06 | Disposition: A | Discharge status: Home / Self Care | Payer: BC Managed Care – PPO | Source: Ambulatory Visit | Attending: Infectious Disease | Admitting: Infectious Disease

## 2021-09-06 ENCOUNTER — Ambulatory Visit (INDEPENDENT_AMBULATORY_CARE_PROVIDER_SITE_OTHER): Payer: BC Managed Care – PPO | Admitting: Pharmacist

## 2021-09-06 ENCOUNTER — Other Ambulatory Visit (HOSPITAL_COMMUNITY): Payer: Self-pay

## 2021-09-06 DIAGNOSIS — B20 Human immunodeficiency virus [HIV] disease: Secondary | ICD-10-CM | POA: Insufficient documentation

## 2021-09-06 DIAGNOSIS — Z23 Encounter for immunization: Secondary | ICD-10-CM | POA: Diagnosis not present

## 2021-09-06 MED ORDER — CABOTEGRAVIR & RILPIVIRINE ER 600 & 900 MG/3ML IM SUER
1.0000 | Freq: Once | INTRAMUSCULAR | Status: AC
  Administered 2021-09-06: 1 via INTRAMUSCULAR

## 2021-09-06 MED ORDER — FLUOXETINE HCL 20 MG PO CAPS
ORAL_CAPSULE | ORAL | 0 refills | Status: DC
  Filled 2021-09-06: qty 30, 30d supply, fill #0

## 2021-09-06 NOTE — Progress Notes (Signed)
HPI: Connor [name] is a 36 y.o. male who presents to the [facility] for [place] administration.  Patient Active Problem List   Diagnosis Date Noted   Upper respiratory infection, acute 01/18/2021   Sore throat 01/18/2021   Cough 01/18/2021   Tinea corporis 03/05/2020   Prediabetes 09/13/2018   GERD (gastroesophageal reflux disease) 09/13/2018   Obesity (BMI 30-39.9) 06/01/2018   Vitamin D deficiency 05/16/2016   Anxiety and depression 05/16/2016   Insomnia 06/28/2011   Human immunodeficiency virus (HIV) disease ([place]) 07/13/2009   Ulcerative colitis ([place]) 07/06/2009    Patient's Medications  New Prescriptions   No medications on file  Previous Medications   CABOTEGRAVIR & RILPIVIRINE ER (CABENUVA) 600 & 900 MG/3ML INJECTION    Inject 1 kit into the muscle every 2 (two) months.   FLUOXETINE (PROZAC) 10 MG CAPSULE    Take 1 capsule by mouth once a day.   FLUTICASONE (FLONASE) 50 MCG/ACT NASAL SPRAY    Place 2 sprays into both nostrils [name].   OMEPRAZOLE (PRILOSEC) 40 MG CAPSULE    Take 1 capsule (40 mg total) by mouth [name].  Modified Medications   No medications on file  Discontinued Medications   No medications on file    Allergies: No Known Allergies  Past Medical History: Past Medical History:  Diagnosis Date   Anxiety    Cough 01/18/2021   Depression    History of syphilis    HIV infection ([place])    Sore throat 01/18/2021   Ulcerative colitis ([place])    Upper respiratory infection, acute 01/18/2021   Vitamin D deficiency     Social History: Social History   Socioeconomic History   Marital status: Single    Spouse name: Not on file   Number of children: Not on file   Years of education: Not on file   Highest education level: Not on file  Occupational History   Not on file  Tobacco Use   Smoking status: Never   Smokeless tobacco: Never  Vaping Use   Vaping Use: Never used  Substance and Sexual Activity   Alcohol use: No     Alcohol/week: 0.0 standard drinks   Drug use: No   Sexual activity: Yes    Partners: Female, Male    Birth control/protection: Condom    Comment: refused condoms  Other Topics Concern   Not on file  Social History Narrative   Not on file   Social Determinants of Health   Financial Resource Strain: Not on file  Food Insecurity: Not on file  Transportation Needs: Not on file  Physical Activity: Not on file  Stress: Not on file  Social Connections: Not on file    Labs: Lab Results  Component Value Date   HIV1RNAQUANT Not Detected 07/14/2021   HIV1RNAQUANT 22 (H) 05/10/2021   HIV1RNAQUANT <20 (H) 03/08/2021   CD4TABS 713 01/11/2021   CD4TABS 655 11/16/2020   CD4TABS 603 02/19/2020    RPR and STI Lab Results  Component Value Date   LABRPR REACTIVE (A) 05/10/2021   LABRPR REACTIVE (A) 11/16/2020   LABRPR REACTIVE (A) 02/19/2020   LABRPR REACTIVE (A) 08/13/2019   LABRPR REACTIVE (A) 06/01/2018   RPRTITER 1:4 (H) 05/10/2021   RPRTITER 1:4 (H) 11/16/2020   RPRTITER 1:4 (H) 02/19/2020   RPRTITER 1:16 (H) 08/13/2019   RPRTITER 1:8 (H) 06/01/2018    STI Results GC CT  03/08/2021  9:16 AM Negative   Negative    11/16/2020 10:53  AM Negative   Negative    05/07/2019  3:08 PM Negative   Negative    11/20/2017 12:00 AM Negative   Negative    07/19/2016 12:00 AM Negative   Negative    09/28/2015 12:00 AM Negative   Negative      Hepatitis B Lab Results  Component Value Date   HEPBSAB INDETER (A) 07/13/2009   HEPBSAG NEG 07/13/2009   HEPBCAB NEG 07/13/2009   Hepatitis C No results found for: HEPCAB, HCVRNAPCRQN Hepatitis A Lab Results  Component Value Date   HAV POS (A) 07/13/2009   Lipids: Lab Results  Component Value Date   CHOL 173 11/16/2020   TRIG 90 11/16/2020   HDL 43 11/16/2020   CHOLHDL 4.0 11/16/2020   VLDL 14.2 07/27/2017   LDLCALC 111 (H) 11/16/2020    TARGET DATE: 24th of the month  Assessment: Connor [name] presents today for their maintenance  Cabenuva injections. Past injections were tolerated well without issues. No problems with systemic side effects of injections. Patient did not experience any soreness or side effects. Since he received the hepatitis b vaccine, a hepatitis b surface Ab test was ordered to assess his immunity. He also received STI testing of urine and RPR.   Administered cabotegravir 624m/3mL in left upper outer quadrant of the gluteal muscle. Administered rilpivirine 900 mg/359min the right upper outer quadrant of the gluteal muscle. Injections were tolerated well. Patient will follow up in 2 months for next set of injections.  Plan: - Cabenuva injections administered - Next injections scheduled for 7/24 with Dr. KuEber Hongnd 9/18 with Dr. VaTommy Medal Call with any issues or questions - Administered second HPV vaccine (next injection due 12/2021) and Menveo booster (next injection due 2028) - F/u on Hep B Sab to assess immunity - F/u on STI testing (RPR, urine) if treatment is necessary    Connor DailyPharmD PGY1 Pharmacy Resident [facility] Infectious [facility]

## 2021-09-07 LAB — URINE CYTOLOGY ANCILLARY ONLY
Chlamydia: NEGATIVE
Comment: NEGATIVE
Comment: NORMAL
Neisseria Gonorrhea: NEGATIVE

## 2021-09-08 ENCOUNTER — Encounter: Payer: Self-pay | Admitting: Infectious Diseases

## 2021-09-08 LAB — RPR TITER: RPR Titer: 1:4 {titer} — ABNORMAL HIGH

## 2021-09-08 LAB — HEPATITIS B SURFACE ANTIBODY,QUALITATIVE: Hep B S Ab: REACTIVE — AB

## 2021-09-08 LAB — RPR: RPR Ser Ql: REACTIVE — AB

## 2021-09-08 LAB — HIV-1 RNA QUANT-NO REFLEX-BLD
HIV 1 RNA Quant: 26 copies/mL — ABNORMAL HIGH
HIV-1 RNA Quant, Log: 1.41 Log copies/mL — ABNORMAL HIGH

## 2021-09-08 LAB — FLUORESCENT TREPONEMAL AB(FTA)-IGG-BLD: Fluorescent Treponemal ABS: REACTIVE — AB

## 2021-09-09 ENCOUNTER — Telehealth: Payer: BC Managed Care – PPO | Admitting: Family Medicine

## 2021-09-09 DIAGNOSIS — H10022 Other mucopurulent conjunctivitis, left eye: Secondary | ICD-10-CM

## 2021-09-09 MED ORDER — POLYMYXIN B-TRIMETHOPRIM 10000-0.1 UNIT/ML-% OP SOLN: 1.0000 [drp] | OPHTHALMIC | 0 refills | Status: DC

## 2021-09-09 NOTE — Progress Notes (Signed)

## 2021-09-19 ENCOUNTER — Telehealth: Payer: BC Managed Care – PPO | Admitting: Nurse Practitioner

## 2021-09-19 DIAGNOSIS — H1013 Acute atopic conjunctivitis, bilateral: Secondary | ICD-10-CM

## 2021-09-19 MED ORDER — AZELASTINE HCL 0.05 % OP SOLN: 1.0000 [drp] | Freq: Two times a day (BID) | OPHTHALMIC | 0 refills | Status: DC

## 2021-09-19 NOTE — Progress Notes (Signed)
I have spent 5 minutes in review of e-visit questionnaire, review and updating patient chart, medical decision making and response to patient.  ° °Alsie Younes W Meghann [place], NP ° °  °

## 2021-09-19 NOTE — Progress Notes (Signed)
If this eye drop doesn't work you will need to follow up with your PCP or any eye doctor. It is very rare that polytrim does not work. It also may be just allergic conjunctivitis which I will be treating you for. This is simply a term for eye irritation from allergies.   E-Visit for [employer]   We are sorry that you are not feeling well.  Here is how we plan to help!  Based on what you have shared with me it looks like you have allergic conjunctivitis.  Conjunctivitis is a common inflammatory or condition of the eye. However, not all conjunctivitis requires antibiotics (ex. Allergic).  We have made appropriate suggestions for you based upon your presentation.  I recommend azelastine eye drops 1-2 drops every 4-6 hours     You should see symptom improvement in 1-2 days after starting the medication regimen.  Call your PCP or schedule an eye exam if symptoms are not improved in 1-2 days.  Home Care: Wash your hands often! Do not wear your contacts until you complete your treatment plan.   Get Help Right Away If: Your symptoms do not improve. You develop blurred or loss of vision. Your symptoms worsen (increased discharge, pain or redness)   Thank you for choosing an e-visit.  Your e-visit answers were reviewed by a board certified advanced clinical practitioner to complete your personal care plan. Depending upon the condition, your plan could have included both over the counter or prescription medications.  Please review your pharmacy choice. Make sure the pharmacy is open so you can pick up prescription now. If there is a problem, you may contact your provider through [employer] and have the prescription routed to another pharmacy.  Your safety is important to [place]. If you have drug allergies check your prescription carefully.   For the next 24 hours you can use MyChart to ask questions about today's visit, request a non-urgent call back, or ask for a work or school excuse. You will  get an email in the next two days asking about your experience. I hope that your e-visit has been valuable and will speed your recovery.

## 2021-10-11 ENCOUNTER — Other Ambulatory Visit (HOSPITAL_COMMUNITY): Payer: Self-pay

## 2021-10-11 MED ORDER — FLUOXETINE HCL 10 MG PO CAPS
ORAL_CAPSULE | ORAL | 0 refills | Status: DC
  Filled 2021-10-11: qty 30, 30d supply, fill #0

## 2021-10-11 MED ORDER — FLUOXETINE HCL 20 MG PO CAPS
ORAL_CAPSULE | ORAL | 0 refills | Status: DC
  Filled 2021-10-11: qty 30, 30d supply, fill #0

## 2021-10-12 ENCOUNTER — Encounter (HOSPITAL_COMMUNITY): Payer: Self-pay | Admitting: Pharmacist

## 2021-10-12 ENCOUNTER — Other Ambulatory Visit (HOSPITAL_COMMUNITY): Payer: Self-pay

## 2021-10-14 ENCOUNTER — Other Ambulatory Visit (HOSPITAL_COMMUNITY): Payer: Self-pay

## 2021-11-08 ENCOUNTER — Other Ambulatory Visit: Payer: Self-pay

## 2021-11-08 ENCOUNTER — Ambulatory Visit (INDEPENDENT_AMBULATORY_CARE_PROVIDER_SITE_OTHER): Payer: BC Managed Care – PPO | Admitting: Pharmacist

## 2021-11-08 DIAGNOSIS — B20 Human immunodeficiency virus [HIV] disease: Secondary | ICD-10-CM

## 2021-11-08 MED ORDER — CABOTEGRAVIR & RILPIVIRINE ER 600 & 900 MG/3ML IM SUER
1.0000 | Freq: Once | INTRAMUSCULAR | Status: AC
  Administered 2021-11-08: 1 via INTRAMUSCULAR

## 2021-11-08 NOTE — Progress Notes (Signed)
HPI: Connor [name] is a 36 y.o. male who presents to the [facility] for [place] administration.  Patient Active Problem List   Diagnosis Date Noted   Upper respiratory infection, acute 01/18/2021   Sore throat 01/18/2021   Cough 01/18/2021   Tinea corporis 03/05/2020   Prediabetes 09/13/2018   GERD (gastroesophageal reflux disease) 09/13/2018   Obesity (BMI 30-39.9) 06/01/2018   Vitamin D deficiency 05/16/2016   Anxiety and depression 05/16/2016   Insomnia 06/28/2011   Human immunodeficiency virus (HIV) disease ([place]) 07/13/2009   Ulcerative colitis ([place]) 07/06/2009    Patient's Medications  New Prescriptions   No medications on file  Previous Medications   AZELASTINE (OPTIVAR) 0.05 % OPHTHALMIC SOLUTION    Place 1 drop into both eyes 2 (two) times daily.   CABOTEGRAVIR & RILPIVIRINE ER (CABENUVA) 600 & 900 MG/3ML INJECTION    Inject 1 kit into the muscle every 2 (two) months.   FLUOXETINE (PROZAC) 10 MG CAPSULE    Take 1 capsule by mouth (take with 80m cap for a total of 322m once a day 30 days   FLUOXETINE (PROZAC) 20 MG CAPSULE    Take 1 capsule by mouth (take with 1082map for a total of 9m75mnce a day 30 days   FLUTICASONE (FLONASE) 50 MCG/ACT NASAL SPRAY    Place 2 sprays into both nostrils daily.   OMEPRAZOLE (PRILOSEC) 40 MG CAPSULE    Take 1 capsule (40 mg total) by mouth daily.   TRIMETHOPRIM-POLYMYXIN B (POLYTRIM) OPHTHALMIC SOLUTION    Place 1-2 drops into the left eye every 4 (four) hours.  Modified Medications   No medications on file  Discontinued Medications   No medications on file    Allergies: No Known Allergies  Past Medical History: Past Medical History:  Diagnosis Date   Anxiety    Cough 01/18/2021   Depression    History of syphilis    HIV infection (HCC)[place] Sore throat 01/18/2021   Ulcerative colitis (HCC)[place] Upper respiratory infection, acute 01/18/2021   Vitamin D deficiency     Social History: Social History    Socioeconomic History   Marital status: Single    Spouse name: Not on file   Number of children: Not on file   Years of education: Not on file   Highest education level: Not on file  Occupational History   Not on file  Tobacco Use   Smoking status: Never   Smokeless tobacco: Never  Vaping Use   Vaping Use: Never used  Substance and Sexual Activity   Alcohol use: No    Alcohol/week: 0.0 standard drinks of alcohol   Drug use: No   Sexual activity: Yes    Partners: Female, Male    Birth control/protection: Condom    Comment: refused condoms  Other Topics Concern   Not on file  Social History Narrative   Not on file   Social Determinants of Health   Financial Resource Strain: Not on file  Food Insecurity: Not on file  Transportation Needs: Not on file  Physical Activity: Not on file  Stress: Not on file  Social Connections: Not on file    Labs: Lab Results  Component Value Date   HIV1RNAQUANT 26 (H) 09/06/2021   HIV1RNAQUANT Not Detected 07/14/2021   HIV1RNAQUANT 22 (H) 05/10/2021   CD4TABS 713 01/11/2021   CD4TABS 655 11/16/2020   CD4TABS 603 02/19/2020    RPR and STI Lab Results  Component Value Date   LABRPR REACTIVE (A) 09/06/2021   LABRPR REACTIVE (A) 05/10/2021   LABRPR REACTIVE (A) 11/16/2020   LABRPR REACTIVE (A) 02/19/2020   LABRPR REACTIVE (A) 08/13/2019   RPRTITER 1:4 (H) 09/06/2021   RPRTITER 1:4 (H) 05/10/2021   RPRTITER 1:4 (H) 11/16/2020   RPRTITER 1:4 (H) 02/19/2020   RPRTITER 1:16 (H) 08/13/2019    STI Results GC CT  09/06/2021  9:14 AM Negative  Negative   03/08/2021  9:16 AM Negative  Negative   11/16/2020 10:53 AM Negative  Negative   05/07/2019  3:08 PM Negative  Negative   11/20/2017 12:00 AM Negative  Negative   07/19/2016 12:00 AM Negative  Negative   09/28/2015 12:00 AM Negative  Negative     Hepatitis B Lab Results  Component Value Date   HEPBSAB REACTIVE (A) 09/06/2021   HEPBSAG NEG 07/13/2009   HEPBCAB NEG  07/13/2009   Hepatitis C No results found for: "HEPCAB", "HCVRNAPCRQN" Hepatitis A Lab Results  Component Value Date   HAV POS (A) 07/13/2009   Lipids: Lab Results  Component Value Date   CHOL 173 11/16/2020   TRIG 90 11/16/2020   HDL 43 11/16/2020   CHOLHDL 4.0 11/16/2020   VLDL 14.2 07/27/2017   LDLCALC 111 (H) 11/16/2020    TARGET DATE: The 24th  Assessment: Connor [name] presents today for his maintenance Cabenuva injections. Past injections were tolerated well without issues.  Administered cabotegravir 635m/3mL in left upper outer quadrant of the gluteal muscle. Administered rilpivirine 900 mg/329min the right upper outer quadrant of the gluteal muscle. No issues with injections. He will follow up in 2 months for next set of injections.  Plan: - Cabenuva injections administered - Next injections scheduled for 01/03/22 with Dr. VaTommy Medalnd 03/07/22 with me - Call with any issues or questions  Sarita Hakanson L. Dacoda Spallone, PharmD, BCIDP, AAHIVP, CPTresckowlinical Pharmacist Practitioner [facility] Infectious Disease

## 2022-01-03 ENCOUNTER — Ambulatory Visit (INDEPENDENT_AMBULATORY_CARE_PROVIDER_SITE_OTHER): Payer: BC Managed Care – PPO | Admitting: Infectious Disease

## 2022-01-03 ENCOUNTER — Other Ambulatory Visit (HOSPITAL_COMMUNITY)
Admission: RE | Admit: 2022-01-03 | Discharge: 2022-01-03 | Disposition: A | Discharge status: Home / Self Care | Payer: BC Managed Care – PPO | Source: Ambulatory Visit | Attending: Infectious Disease | Admitting: Infectious Disease

## 2022-01-03 ENCOUNTER — Other Ambulatory Visit: Payer: Self-pay

## 2022-01-03 ENCOUNTER — Other Ambulatory Visit (HOSPITAL_COMMUNITY): Payer: Self-pay

## 2022-01-03 ENCOUNTER — Encounter: Payer: Self-pay | Admitting: Infectious Disease

## 2022-01-03 VITALS — BP 118/75 | HR 64 | Resp 16 | Ht 71.0 in | Wt 255.1 lb

## 2022-01-03 DIAGNOSIS — B20 Human immunodeficiency virus [HIV] disease: Secondary | ICD-10-CM

## 2022-01-03 DIAGNOSIS — R7303 Prediabetes: Secondary | ICD-10-CM | POA: Diagnosis not present

## 2022-01-03 DIAGNOSIS — K5100 Ulcerative (chronic) pancolitis without complications: Secondary | ICD-10-CM | POA: Diagnosis not present

## 2022-01-03 DIAGNOSIS — Z113 Encounter for screening for infections with a predominantly sexual mode of transmission: Secondary | ICD-10-CM

## 2022-01-03 DIAGNOSIS — E669 Obesity, unspecified: Secondary | ICD-10-CM | POA: Diagnosis not present

## 2022-01-03 DIAGNOSIS — Z7185 Encounter for immunization safety counseling: Secondary | ICD-10-CM

## 2022-01-03 DIAGNOSIS — Z23 Encounter for immunization: Secondary | ICD-10-CM

## 2022-01-03 DIAGNOSIS — E782 Mixed hyperlipidemia: Secondary | ICD-10-CM

## 2022-01-03 HISTORY — DX: Encounter for screening for infections with a predominantly sexual mode of transmission: Z11.3

## 2022-01-03 MED ORDER — CABOTEGRAVIR & RILPIVIRINE ER 600 & 900 MG/3ML IM SUER
1.0000 | Freq: Once | INTRAMUSCULAR | Status: AC
  Administered 2022-01-03: 1 via INTRAMUSCULAR

## 2022-01-03 MED ORDER — ROSUVASTATIN CALCIUM 20 MG PO TABS
20.0000 mg | ORAL_TABLET | Freq: Every day | ORAL | 11 refills | Status: DC
  Filled 2022-01-03: qty 30, 30d supply, fill #0
  Filled 2022-02-09: qty 30, 30d supply, fill #1
  Filled 2022-05-09: qty 30, 30d supply, fill #2
  Filled 2022-07-12: qty 30, 30d supply, fill #3
  Filled 2022-08-08: qty 30, 30d supply, fill #4
  Filled 2022-09-05: qty 30, 30d supply, fill #5
  Filled 2022-09-21: qty 30, 30d supply, fill #6
  Filled 2022-11-05: qty 30, 30d supply, fill #7

## 2022-01-03 NOTE — Progress Notes (Signed)
Subjective:  Chief complaint follow-up for HIV disease on every 2 monthly Cabenuva.  Complaining of some fatigue  Patient ID: Connor [name], male    DOB: 1986/02/19, 36 y.o.   MRN: [id number]  HPI Connor [name] is a 36 year old black man with HIV that has been perfectly controlled most recently on every 2 monthly Cabenuva with target date #24.  He has noticed some fatigue and at times depressive symptoms that he associates with his antiretrovirals in which she experienced previously on [place].  We discussed the risk of depression and other CNS side effects that do exist with NNRTIs such as repetitive rain and have been found also with integrase strand transfer inhibitors including cabotegravir.  That being said he would like to stay on the [place] he prefers the every 33-monthinjections and not having to think about taking his medications.  We discussed the results of reprieve study and the fact I think it be beneficial to initiate a statin it looks like [place] statin is not covered so I will initiate Crestor he would also like to be tested for STIs with screening for gonorrhea and chlamydia.  He did have a history of ulcerative colitis many decades ago but it was not been active for some time.    Past Medical History:  Diagnosis Date   Anxiety    Cough 01/18/2021   Depression    History of syphilis    HIV infection ([place]    Sore throat 01/18/2021   Ulcerative colitis ([place]    Upper respiratory infection, acute 01/18/2021   Vitamin D deficiency     No past surgical history on file.  Family History  Problem Relation Age of Onset   Hypertension Mother    Alcohol abuse Father    Hypertension Father    Diabetes Maternal Grandmother    Stroke Paternal Grandfather    Cancer Neg Hx       Social History   Socioeconomic History   Marital status: Single    Spouse name: Not on file   Number of children: Not on file   Years of education: Not on file   Highest education level:  Not on file  Occupational History   Not on file  Tobacco Use   Smoking status: Never   Smokeless tobacco: Never  Vaping Use   Vaping Use: Never used  Substance and Sexual Activity   Alcohol use: No    Alcohol/week: 0.0 standard drinks of alcohol   Drug use: No   Sexual activity: Yes    Partners: Female, Male    Birth control/protection: Condom    Comment: refused condoms  Other Topics Concern   Not on file  Social History Narrative   Not on file   Social Determinants of Health   Financial Resource Strain: Not on file  Food Insecurity: Not on file  Transportation Needs: Not on file  Physical Activity: Not on file  Stress: Not on file  Social Connections: Not on file    No Known Allergies   Current Outpatient Medications:    azelastine (OPTIVAR) 0.05 % ophthalmic solution, Place 1 drop into both eyes 2 (two) times daily., Disp: 6 mL, Rfl: 0   cabotegravir & rilpivirine ER (CABENUVA) 600 & 900 MG/3ML injection, Inject 1 kit into the muscle every 2 (two) months., Disp: 6 mL, Rfl: 5   FLUoxetine (PROZAC) 10 MG capsule, Take 1 capsule by mouth (take with 265mcap for a total of 3066monce a day 30 days,  Disp: 30 capsule, Rfl: 0   FLUoxetine (PROZAC) 20 MG capsule, Take 1 capsule by mouth (take with 78m cap for a total of 368m once a day 30 days, Disp: 30 capsule, Rfl: 0   fluticasone (FLONASE) 50 MCG/ACT nasal spray, Place 2 sprays into both nostrils daily. (Patient not taking: Reported on 12/07/2020), Disp: 16 g, Rfl: 0   omeprazole (PRILOSEC) 40 MG capsule, Take 1 capsule (40 mg total) by mouth daily., Disp: 30 capsule, Rfl: 5   trimethoprim-polymyxin b (POLYTRIM) ophthalmic solution, Place 1-2 drops into the left eye every 4 (four) hours., Disp: 10 mL, Rfl: 0   Review of Systems  Constitutional:  Positive for fatigue. Negative for activity change, appetite change, chills, diaphoresis, fever and unexpected weight change.  HENT:  Negative for congestion, rhinorrhea, sinus  pressure, sneezing, sore throat and trouble swallowing.   Eyes:  Negative for photophobia and visual disturbance.  Respiratory:  Negative for cough, chest tightness, shortness of breath, wheezing and stridor.   Cardiovascular:  Negative for chest pain, palpitations and leg swelling.  Gastrointestinal:  Negative for abdominal distention, abdominal pain, anal bleeding, blood in stool, constipation, diarrhea, nausea and vomiting.  Genitourinary:  Negative for difficulty urinating, dysuria, flank pain and hematuria.  Musculoskeletal:  Negative for arthralgias, back pain, gait problem, joint swelling and myalgias.  Skin:  Negative for color change, pallor, rash and wound.  Neurological:  Negative for dizziness, tremors, weakness and light-headedness.  Hematological:  Negative for adenopathy. Does not bruise/bleed easily.  Psychiatric/Behavioral:  Negative for agitation, behavioral problems, confusion, decreased concentration, dysphoric mood and sleep disturbance.        Objective:   Physical Exam Constitutional:      Appearance: He is well-developed.  HENT:     Head: Normocephalic and atraumatic.  Eyes:     Conjunctiva/sclera: Conjunctivae normal.  Cardiovascular:     Rate and Rhythm: Normal rate and regular rhythm.  Pulmonary:     Effort: Pulmonary effort is normal. No respiratory distress.     Breath sounds: No wheezing.  Abdominal:     General: There is no distension.     Palpations: Abdomen is soft.  Musculoskeletal:        General: No tenderness. Normal range of motion.     Cervical back: Normal range of motion and neck supple.  Skin:    General: Skin is warm and dry.     Coloration: Skin is not pale.     Findings: No erythema or rash.  Neurological:     General: No focal deficit present.     Mental Status: He is alert and oriented to person, place, and time.  Psychiatric:        Mood and Affect: Mood normal.        Behavior: Behavior normal.        Thought Content: Thought  content normal.        Judgment: Judgment normal.           Assessment & Plan:  HIV disease:  I will add order HIV viral load CD4 count CBC with differential CMP, RPR GC and chlamydia and we will give him his Q2M Cabenuva injection  ? UC: He says he had this many decades ago but not been active   Obesity: To work on weight loss as we know that his prediabetic state in the past could lead ultimately his diabetes   Vaccine counsling: needs #3 HPV vaccine and should get updated COVID booster and annual flu  shot  CV prevention: discussed results from REPRIEVE study.  We will start Crestor.  Fatigue and at times some depressive symptoms: He attributes some of this to his antiretroviral therapy but again would prefer to be on Cabenuva I think if we were to blame noticed symptoms on the current regimen want to move away from it we need to move to a protease inhibitor based regimen which would be okay but would introduce drug drug interactions and would not afford him the every 23-monthinjections in the liberation of them having to take about medications daily  STI screening: will need for gonorrhea chlamydia and urine oropharynx and rectum.  I spent 42 minutes with the patient including than 50% of the time in face to face counseling of the patient regarding different antiretroviral regimens regarding the data from reprieve,  along with review of medical records in preparation for the visit and during the visit and in coordination of his care.

## 2022-01-03 NOTE — Addendum Note (Signed)
Addended by: Theresia Majors A on: 01/03/2022 09:30 AM   Modules accepted: Orders

## 2022-01-04 LAB — CYTOLOGY, (ORAL, ANAL, URETHRAL) ANCILLARY ONLY
Chlamydia: NEGATIVE
Chlamydia: NEGATIVE
Comment: NEGATIVE
Comment: NEGATIVE
Comment: NORMAL
Comment: NORMAL
Neisseria Gonorrhea: NEGATIVE
Neisseria Gonorrhea: NEGATIVE

## 2022-01-04 LAB — URINE CYTOLOGY ANCILLARY ONLY
Chlamydia: NEGATIVE
Comment: NEGATIVE
Comment: NORMAL
Neisseria Gonorrhea: NEGATIVE

## 2022-01-04 LAB — T-HELPER CELLS (CD4) COUNT (NOT AT ARMC)
CD4 % Helper T Cell: 24 % — ABNORMAL LOW (ref 33–65)
CD4 T Cell Abs: 567 /uL (ref 400–1790)

## 2022-01-06 ENCOUNTER — Other Ambulatory Visit (HOSPITAL_COMMUNITY): Payer: Self-pay

## 2022-01-06 LAB — LIPID PANEL
Cholesterol: 182 mg/dL (ref <200)
HDL: 43 mg/dL (ref >=40)
LDL Cholesterol (Calc): 123 mg/dL (calc) — ABNORMAL HIGH
Non-HDL Cholesterol (Calc): 139 mg/dL (calc) — ABNORMAL HIGH (ref <130)
Total CHOL/HDL Ratio: 4.2 (calc) (ref <5.0)
Triglycerides: 70 mg/dL (ref <150)

## 2022-01-06 LAB — FLUORESCENT TREPONEMAL AB(FTA)-IGG-BLD: Fluorescent Treponemal ABS: REACTIVE — AB

## 2022-01-06 LAB — COMPLETE METABOLIC PANEL WITH GFR
AG Ratio: 1.7 (calc) (ref 1.0–2.5)
ALT: 9 U/L (ref 9–46)
AST: 12 U/L (ref 10–40)
Albumin: 4.2 g/dL (ref 3.6–5.1)
Alkaline phosphatase (APISO): 88 U/L (ref 36–130)
BUN: 12 mg/dL (ref 7–25)
CO2: 27 mmol/L (ref 20–32)
Calcium: 9.4 mg/dL (ref 8.6–10.3)
Chloride: 108 mmol/L (ref 98–110)
Creat: 1.04 mg/dL (ref 0.60–1.26)
Globulin: 2.5 g/dL (calc) (ref 1.9–3.7)
Glucose, Bld: 97 mg/dL (ref 65–99)
Potassium: 4 mmol/L (ref 3.5–5.3)
Sodium: 142 mmol/L (ref 135–146)
Total Bilirubin: 0.4 mg/dL (ref 0.2–1.2)
Total Protein: 6.7 g/dL (ref 6.1–8.1)
eGFR: 94 mL/min/{1.73_m2} (ref >=60)

## 2022-01-06 LAB — CBC WITH DIFFERENTIAL/PLATELET
Absolute Monocytes: 563 cells/uL (ref 200–950)
Basophils Absolute: 50 cells/uL (ref 0–200)
Basophils Relative: 0.6 %
Eosinophils Absolute: 269 cells/uL (ref 15–500)
Eosinophils Relative: 3.2 %
HCT: 37.3 % — ABNORMAL LOW (ref 38.5–50.0)
Hemoglobin: 12.6 g/dL — ABNORMAL LOW (ref 13.2–17.1)
Lymphs Abs: 2646 cells/uL (ref 850–3900)
MCH: 28.6 pg (ref 27.0–33.0)
MCHC: 33.8 g/dL (ref 32.0–36.0)
MCV: 84.6 fL (ref 80.0–100.0)
MPV: 12 fL (ref 7.5–12.5)
Monocytes Relative: 6.7 %
Neutro Abs: 4872 cells/uL (ref 1500–7800)
Neutrophils Relative %: 58 %
Platelets: 208 10*3/uL (ref 140–400)
RBC: 4.41 10*6/uL (ref 4.20–5.80)
RDW: 12.3 % (ref 11.0–15.0)
Total Lymphocyte: 31.5 %
WBC: 8.4 10*3/uL (ref 3.8–10.8)

## 2022-01-06 LAB — HIV-1 RNA QUANT-NO REFLEX-BLD
HIV 1 RNA Quant: NOT DETECTED Copies/mL
HIV-1 RNA Quant, Log: NOT DETECTED Log cps/mL

## 2022-01-06 LAB — HEMOGLOBIN A1C
Hgb A1c MFr Bld: 5.8 % of total Hgb — ABNORMAL HIGH (ref <5.7)
Mean Plasma Glucose: 120 mg/dL
eAG (mmol/L): 6.6 mmol/L

## 2022-01-06 LAB — RPR TITER: RPR Titer: 1:4 {titer} — ABNORMAL HIGH

## 2022-01-06 LAB — RPR: RPR Ser Ql: REACTIVE — AB

## 2022-02-09 ENCOUNTER — Other Ambulatory Visit (HOSPITAL_COMMUNITY): Payer: Self-pay

## 2022-03-05 NOTE — Progress Notes (Signed)
HPI: Connor [name] is a 36 y.o. male who presents to the [facility] for [place] administration.  Patient Active Problem List   Diagnosis Date Noted   Routine screening for STI (sexually transmitted infection) 01/03/2022   Upper respiratory infection, acute 01/18/2021   Sore throat 01/18/2021   Cough 01/18/2021   Tinea corporis 03/05/2020   Prediabetes 09/13/2018   GERD (gastroesophageal reflux disease) 09/13/2018   Obesity (BMI 30-39.9) 06/01/2018   Vitamin D deficiency 05/16/2016   Anxiety and depression 05/16/2016   Insomnia 06/28/2011   Human immunodeficiency virus (HIV) disease ([place]) 07/13/2009   Ulcerative colitis ([place]) 07/06/2009    Patient's Medications  New Prescriptions   No medications on file  Previous Medications   AZELASTINE (OPTIVAR) 0.05 % OPHTHALMIC SOLUTION    Place 1 drop into both eyes 2 (two) times daily.   CABOTEGRAVIR & RILPIVIRINE ER (CABENUVA) 600 & 900 MG/3ML INJECTION    Inject 1 kit into the muscle every 2 (two) months.   FLUOXETINE (PROZAC) 10 MG CAPSULE    Take 1 capsule by mouth (take with 71m cap for a total of 359m once a day 30 days   FLUOXETINE (PROZAC) 20 MG CAPSULE    Take 1 capsule by mouth (take with 1082map for a total of 58m65mnce a day 30 days   FLUTICASONE (FLONASE) 50 MCG/ACT NASAL SPRAY    Place 2 sprays into both nostrils daily.   OMEPRAZOLE (PRILOSEC) 40 MG CAPSULE    Take 1 capsule (40 mg total) by mouth daily.   ROSUVASTATIN (CRESTOR) 20 MG TABLET    Take 1 tablet (20 mg total) by mouth daily.   TRIMETHOPRIM-POLYMYXIN B (POLYTRIM) OPHTHALMIC SOLUTION    Place 1-2 drops into the left eye every 4 (four) hours.  Modified Medications   No medications on file  Discontinued Medications   No medications on file    Allergies: No Known Allergies  Past Medical History: Past Medical History:  Diagnosis Date   Anxiety    Cough 01/18/2021   Depression    History of syphilis    HIV infection (HCC)[place] Routine  screening for STI (sexually transmitted infection) 01/03/2022   Sore throat 01/18/2021   Ulcerative colitis (HCC)[place] Upper respiratory infection, acute 01/18/2021   Vitamin D deficiency     Social History: Social History   Socioeconomic History   Marital status: Single    Spouse name: Not on file   Number of children: Not on file   Years of education: Not on file   Highest education level: Not on file  Occupational History   Not on file  Tobacco Use   Smoking status: Never   Smokeless tobacco: Never  Vaping Use   Vaping Use: Never used  Substance and Sexual Activity   Alcohol use: No    Alcohol/week: 0.0 standard drinks of alcohol   Drug use: No   Sexual activity: Yes    Partners: Female, Male    Birth control/protection: Condom    Comment: refused condoms  Other Topics Concern   Not on file  Social History Narrative   Not on file   Social Determinants of Health   Financial Resource Strain: Not on file  Food Insecurity: Not on file  Transportation Needs: Not on file  Physical Activity: Not on file  Stress: Not on file  Social Connections: Not on file    Labs: Lab Results  Component Value Date   HIV1RNAQUANT  Not Detected 01/03/2022   HIV1RNAQUANT 26 (H) 09/06/2021   HIV1RNAQUANT Not Detected 07/14/2021   CD4TABS 567 01/03/2022   CD4TABS 713 01/11/2021   CD4TABS 655 11/16/2020    RPR and STI Lab Results  Component Value Date   LABRPR REACTIVE (A) 01/03/2022   LABRPR REACTIVE (A) 09/06/2021   LABRPR REACTIVE (A) 05/10/2021   LABRPR REACTIVE (A) 11/16/2020   LABRPR REACTIVE (A) 02/19/2020   RPRTITER 1:4 (H) 01/03/2022   RPRTITER 1:4 (H) 09/06/2021   RPRTITER 1:4 (H) 05/10/2021   RPRTITER 1:4 (H) 11/16/2020   RPRTITER 1:4 (H) 02/19/2020    STI Results GC CT  01/03/2022  9:06 AM Negative    Negative    Negative  Negative    Negative    Negative   09/06/2021  9:14 AM Negative  Negative   03/08/2021  9:16 AM Negative  Negative   11/16/2020 10:53 AM  Negative  Negative   05/07/2019  3:08 PM Negative  Negative   11/20/2017 12:00 AM Negative  Negative   07/19/2016 12:00 AM Negative  Negative   09/28/2015 12:00 AM Negative  Negative     Hepatitis B Lab Results  Component Value Date   HEPBSAB REACTIVE (A) 09/06/2021   HEPBSAG NEG 07/13/2009   HEPBCAB NEG 07/13/2009   Hepatitis C No results found for: "HEPCAB", "HCVRNAPCRQN" Hepatitis A Lab Results  Component Value Date   HAV POS (A) 07/13/2009   Lipids: Lab Results  Component Value Date   CHOL 182 01/03/2022   TRIG 70 01/03/2022   HDL 43 01/03/2022   CHOLHDL 4.2 01/03/2022   VLDL 14.2 07/27/2017   LDLCALC 123 (H) 01/03/2022    TARGET DATE: The 24th of the month  Assessment: Connor [name] presents today for his maintenance Cabenuva injections. Past injections were tolerated well without issues. He was offered STI screening and condoms which he declined at this time. We explained that we would hold off on the HIV viral load since he has been undetectable and we aren't getting any other lab work today. He was agreeable to this.  Patient is eligible for the flu, 2nd Mpox and COVID vaccines. After discussing the risks and benefits of these vaccines, he reports having the flu vaccine already, accepted the Mpox vaccine and declined the COVID vaccine at this time. The Mpox vaccine was administered SubQ in the left arm.  Administered cabotegravir 676m/3mL in left upper outer quadrant of the gluteal muscle. Administered rilpivirine 900 mg/314min the right upper outer quadrant of the gluteal muscle given at the same time. No issues with injections. He will follow up in 2 months for next set of injections.  Plan: - Cabenuva injections administered - Administered the 2nd Mpox vaccine - Next injections scheduled for 05/09/2022 at 9:15 AM with Cassie - Following injections scheduled for 07/06/2022 at 9:15 AM with Cassie - Call with any issues or questions  [facility]

## 2022-03-07 ENCOUNTER — Ambulatory Visit (INDEPENDENT_AMBULATORY_CARE_PROVIDER_SITE_OTHER): Payer: BC Managed Care – PPO | Admitting: Pharmacist

## 2022-03-07 ENCOUNTER — Other Ambulatory Visit: Payer: Self-pay

## 2022-03-07 DIAGNOSIS — Z23 Encounter for immunization: Secondary | ICD-10-CM

## 2022-03-07 DIAGNOSIS — B20 Human immunodeficiency virus [HIV] disease: Secondary | ICD-10-CM

## 2022-03-07 MED ORDER — CABOTEGRAVIR & RILPIVIRINE ER 600 & 900 MG/3ML IM SUER
1.0000 | Freq: Once | INTRAMUSCULAR | Status: AC
  Administered 2022-03-07: 1 via INTRAMUSCULAR

## 2022-04-29 ENCOUNTER — Other Ambulatory Visit (HOSPITAL_COMMUNITY): Payer: Self-pay

## 2022-05-09 ENCOUNTER — Other Ambulatory Visit (HOSPITAL_COMMUNITY): Payer: Self-pay

## 2022-05-09 ENCOUNTER — Ambulatory Visit (INDEPENDENT_AMBULATORY_CARE_PROVIDER_SITE_OTHER): Payer: BC Managed Care – PPO | Admitting: Pharmacist

## 2022-05-09 ENCOUNTER — Other Ambulatory Visit: Payer: Self-pay

## 2022-05-09 DIAGNOSIS — B20 Human immunodeficiency virus [HIV] disease: Secondary | ICD-10-CM | POA: Diagnosis not present

## 2022-05-09 DIAGNOSIS — U071 COVID-19: Secondary | ICD-10-CM

## 2022-05-09 MED ORDER — NIRMATRELVIR/RITONAVIR (PAXLOVID)TABLET
3.0000 | ORAL_TABLET | Freq: Two times a day (BID) | ORAL | 0 refills | Status: AC
  Filled 2022-05-09: qty 30, 5d supply, fill #0

## 2022-05-09 MED ORDER — CABOTEGRAVIR & RILPIVIRINE ER 600 & 900 MG/3ML IM SUER
1.0000 | Freq: Once | INTRAMUSCULAR | Status: AC
  Administered 2022-05-09: 1 via INTRAMUSCULAR

## 2022-05-09 NOTE — Progress Notes (Signed)
HPI: Connor [name] is a 37 y.o. male who presents to the [facility] for [place] administration.  Patient Active Problem List   Diagnosis Date Noted   Routine screening for STI (sexually transmitted infection) 01/03/2022   Upper respiratory infection, acute 01/18/2021   Sore throat 01/18/2021   Cough 01/18/2021   Tinea corporis 03/05/2020   Prediabetes 09/13/2018   GERD (gastroesophageal reflux disease) 09/13/2018   Obesity (BMI 30-39.9) 06/01/2018   Vitamin D deficiency 05/16/2016   Anxiety and depression 05/16/2016   Insomnia 06/28/2011   Human immunodeficiency virus (HIV) disease ([place]) 07/13/2009   Ulcerative colitis ([place]) 07/06/2009    Patient's Medications  New Prescriptions   No medications on file  Previous Medications   AZELASTINE (OPTIVAR) 0.05 % OPHTHALMIC SOLUTION    Place 1 drop into both eyes 2 (two) times daily.   CABOTEGRAVIR & RILPIVIRINE ER (CABENUVA) 600 & 900 MG/3ML INJECTION    Inject 1 kit into the muscle every 2 (two) months.   FLUOXETINE (PROZAC) 10 MG CAPSULE    Take 1 capsule by mouth (take with 71m cap for a total of 359m once a day 30 days   FLUOXETINE (PROZAC) 20 MG CAPSULE    Take 1 capsule by mouth (take with 1082map for a total of 58m65mnce a day 30 days   FLUTICASONE (FLONASE) 50 MCG/ACT NASAL SPRAY    Place 2 sprays into both nostrils daily.   OMEPRAZOLE (PRILOSEC) 40 MG CAPSULE    Take 1 capsule (40 mg total) by mouth daily.   ROSUVASTATIN (CRESTOR) 20 MG TABLET    Take 1 tablet (20 mg total) by mouth daily.   TRIMETHOPRIM-POLYMYXIN B (POLYTRIM) OPHTHALMIC SOLUTION    Place 1-2 drops into the left eye every 4 (four) hours.  Modified Medications   No medications on file  Discontinued Medications   No medications on file    Allergies: No Known Allergies  Past Medical History: Past Medical History:  Diagnosis Date   Anxiety    Cough 01/18/2021   Depression    History of syphilis    HIV infection (HCC)[place] Routine  screening for STI (sexually transmitted infection) 01/03/2022   Sore throat 01/18/2021   Ulcerative colitis (HCC)[place] Upper respiratory infection, acute 01/18/2021   Vitamin D deficiency     Social History: Social History   Socioeconomic History   Marital status: Single    Spouse name: Not on file   Number of children: Not on file   Years of education: Not on file   Highest education level: Not on file  Occupational History   Not on file  Tobacco Use   Smoking status: Never   Smokeless tobacco: Never  Vaping Use   Vaping Use: Never used  Substance and Sexual Activity   Alcohol use: No    Alcohol/week: 0.0 standard drinks of alcohol   Drug use: No   Sexual activity: Yes    Partners: Female, Male    Birth control/protection: Condom    Comment: refused condoms  Other Topics Concern   Not on file  Social History Narrative   Not on file   Social Determinants of Health   Financial Resource Strain: Not on file  Food Insecurity: Not on file  Transportation Needs: Not on file  Physical Activity: Not on file  Stress: Not on file  Social Connections: Not on file    Labs: Lab Results  Component Value Date   HIV1RNAQUANT  Not Detected 01/03/2022   HIV1RNAQUANT 26 (H) 09/06/2021   HIV1RNAQUANT Not Detected 07/14/2021   CD4TABS 567 01/03/2022   CD4TABS 713 01/11/2021   CD4TABS 655 11/16/2020    RPR and STI Lab Results  Component Value Date   LABRPR REACTIVE (A) 01/03/2022   LABRPR REACTIVE (A) 09/06/2021   LABRPR REACTIVE (A) 05/10/2021   LABRPR REACTIVE (A) 11/16/2020   LABRPR REACTIVE (A) 02/19/2020   RPRTITER 1:4 (H) 01/03/2022   RPRTITER 1:4 (H) 09/06/2021   RPRTITER 1:4 (H) 05/10/2021   RPRTITER 1:4 (H) 11/16/2020   RPRTITER 1:4 (H) 02/19/2020    STI Results GC CT  01/03/2022  9:06 AM Negative    Negative    Negative  Negative    Negative    Negative   09/06/2021  9:14 AM Negative  Negative   03/08/2021  9:16 AM Negative  Negative   11/16/2020 10:53 AM  Negative  Negative   05/07/2019  3:08 PM Negative  Negative   11/20/2017 12:00 AM Negative  Negative   07/19/2016 12:00 AM Negative  Negative   09/28/2015 12:00 AM Negative  Negative     Hepatitis B Lab Results  Component Value Date   HEPBSAB REACTIVE (A) 09/06/2021   HEPBSAG NEG 07/13/2009   HEPBCAB NEG 07/13/2009   Hepatitis C No results found for: "HEPCAB", "HCVRNAPCRQN" Hepatitis A Lab Results  Component Value Date   HAV POS (A) 07/13/2009   Lipids: Lab Results  Component Value Date   CHOL 182 01/03/2022   TRIG 70 01/03/2022   HDL 43 01/03/2022   CHOLHDL 4.2 01/03/2022   VLDL 14.2 07/27/2017   LDLCALC 123 (H) 01/03/2022    TARGET DATE: The 24th  Assessment: Connor [name] presents today for his maintenance Cabenuva injections. Past injections were tolerated well without issues. After he was roomed, he told me that he tested positive for COVID yesterday. Agreed to go ahead and administer his injections but told him that he should have rescheduled. Will not do labs today so he does not expose anyone else. We both wore masks during the encounter. He is requesting Paxlovid, so I will Rx that for him and send it to [place] [facility].  Administered cabotegravir 600mg /24mL in left upper outer quadrant of the gluteal muscle. Administered rilpivirine 900 mg/49mL in the right upper outer quadrant of the gluteal muscle. No issues with injections. He will follow up in 2 months for next set of injections.  Plan: - Cabenuva injections administered - Next injections scheduled for 07/11/22 with me - will have him see Dr. Tommy Medal for his next injections in May - Call with any issues or questions  Lashia Niese L. Candice Tobey, PharmD, BCIDP, AAHIVP, [place] Clinical Pharmacist Practitioner [facility]

## 2022-05-18 ENCOUNTER — Other Ambulatory Visit: Payer: Self-pay

## 2022-05-18 ENCOUNTER — Other Ambulatory Visit (HOSPITAL_COMMUNITY): Payer: Self-pay

## 2022-05-18 ENCOUNTER — Telehealth: Payer: BC Managed Care – PPO | Admitting: Physician Assistant

## 2022-05-18 DIAGNOSIS — J208 Acute bronchitis due to other specified organisms: Secondary | ICD-10-CM | POA: Diagnosis not present

## 2022-05-18 DIAGNOSIS — B9689 Other specified bacterial agents as the cause of diseases classified elsewhere: Secondary | ICD-10-CM

## 2022-05-18 MED ORDER — DOXYCYCLINE HYCLATE 100 MG PO TABS
100.0000 mg | ORAL_TABLET | Freq: Two times a day (BID) | ORAL | 0 refills | Status: DC
  Filled 2022-05-18: qty 14, 7d supply, fill #0

## 2022-05-18 MED ORDER — BENZONATATE 100 MG PO CAPS
100.0000 mg | ORAL_CAPSULE | Freq: Three times a day (TID) | ORAL | 0 refills | Status: DC | PRN
  Filled 2022-05-18: qty 30, 10d supply, fill #0

## 2022-05-18 NOTE — Patient Instructions (Signed)
Connor [name], thank you for joining Leeanne Rio, PA-C for today's virtual visit.  While this provider is not your primary care provider (PCP), if your PCP is located in our provider database this encounter information will be shared with them immediately following your visit.   [facility] account gives you access to today's visit and all your visits, tests, and labs performed at [facility] " click here if you don't have a [facility] account or go to [email or web address]  Consent: (Patient) Connor [name] provided verbal consent for this virtual visit at the beginning of the encounter.  Current Medications:  Current Outpatient Medications:    benzonatate (TESSALON) 100 MG capsule, Take 1 capsule (100 mg total) by mouth 3 (three) times daily as needed for cough., Disp: 30 capsule, Rfl: 0   doxycycline (VIBRA-TABS) 100 MG tablet, Take 1 tablet (100 mg total) by mouth 2 (two) times daily., Disp: 14 tablet, Rfl: 0   cabotegravir & rilpivirine ER (CABENUVA) 600 & 900 MG/3ML injection, Inject 1 kit into the muscle every 2 (two) months., Disp: 6 mL, Rfl: 5   FLUoxetine (PROZAC) 10 MG capsule, Take 1 capsule by mouth (take with 20mg  cap for a total of 30mg ) once a day 30 days, Disp: 30 capsule, Rfl: 0   FLUoxetine (PROZAC) 20 MG capsule, Take 1 capsule by mouth (take with 10mg  cap for a total of 30mg ) once a day 30 days, Disp: 30 capsule, Rfl: 0   fluticasone (FLONASE) 50 MCG/ACT nasal spray, Place 2 sprays into both nostrils daily. (Patient not taking: Reported on 12/07/2020), Disp: 16 g, Rfl: 0   omeprazole (PRILOSEC) 40 MG capsule, Take 1 capsule (40 mg total) by mouth daily., Disp: 30 capsule, Rfl: 5   rosuvastatin (CRESTOR) 20 MG tablet, Take 1 tablet (20 mg total) by mouth daily., Disp: 30 tablet, Rfl: 11   trimethoprim-polymyxin b (POLYTRIM) ophthalmic solution, Place 1-2 drops into the left eye every 4 (four) hours., Disp: 10 mL, Rfl: 0    Medications ordered in this encounter:  Meds ordered this encounter  Medications   doxycycline (VIBRA-TABS) 100 MG tablet    Sig: Take 1 tablet (100 mg total) by mouth 2 (two) times daily.    Dispense:  14 tablet    Refill:  0    Order Specific Question:   Supervising Provider    Answer:   Chase Picket [[id number]]   benzonatate (TESSALON) 100 MG capsule    Sig: Take 1 capsule (100 mg total) by mouth 3 (three) times daily as needed for cough.    Dispense:  30 capsule    Refill:  0    Order Specific Question:   Supervising Provider    Answer:   Chase Picket [id number]     *If you need refills on other medications prior to your next appointment, please contact your pharmacy*  Follow-Up: Call back or seek an in-person evaluation if the symptoms worsen or if the condition fails to improve as anticipated.  [facility] [phone]  Other Instructions Take antibiotic (Doxycycline) as directed.  Increase fluids.  Get plenty of rest. Use Mucinex for congestion. Tessalon as directed for cough. Take a daily probiotic (I recommend Align or Culturelle, but even Activia Yogurt may be beneficial).  A humidifier placed in the bedroom may offer some relief for a dry, scratchy throat of nasal irritation.  Read information below on acute bronchitis.   If no improvement within 48  hours or any new/progressing symptoms despite treatment, you need to be evaluated in person ASAP. DO NOT DELAY CARE.   Acute Bronchitis Bronchitis is when the airways that extend from the windpipe into the lungs get red, puffy, and painful (inflamed). Bronchitis often causes thick spit (mucus) to develop. This leads to a cough. A cough is the most common symptom of bronchitis. In acute bronchitis, the condition usually begins suddenly and goes away over time (usually in 2 weeks). Smoking, allergies, and asthma can make bronchitis worse. Repeated episodes of bronchitis may cause more lung  problems.  HOME CARE Rest. Drink enough fluids to keep your pee (urine) clear or pale yellow (unless you need to limit fluids as told by your doctor). Only take over-the-counter or prescription medicines as told by your doctor. Avoid smoking and secondhand smoke. These can make bronchitis worse. If you are a smoker, think about using nicotine gum or skin patches. Quitting smoking will help your lungs heal faster. Reduce the chance of getting bronchitis again by: Washing your hands often. Avoiding people with cold symptoms. Trying not to touch your hands to your mouth, nose, or eyes. Follow up with your doctor as told.  GET HELP IF: Your symptoms do not improve after 1 week of treatment. Symptoms include: Cough. Fever. Coughing up thick spit. Body aches. Chest congestion. Chills. Shortness of breath. Sore throat.  GET HELP RIGHT AWAY IF:  You have an increased fever. You have chills. You have severe shortness of breath. You have bloody thick spit (sputum). You throw up (vomit) often. You lose too much body fluid (dehydration). You have a severe headache. You faint.  MAKE SURE YOU:  Understand these instructions. Will watch your condition. Will get help right away if you are not doing well or get worse. Document Released: 09/21/2007 Document Revised: 12/05/2012 Document Reviewed: 09/25/2012 [facility] Patient Information 2015 [place], [place]. This information is not intended to replace advice given to you by your health care provider. Make sure you discuss any questions you have with your health care provider.    If you have been instructed to have an in-person evaluation today at a local Urgent Care facility, please use the link below. It will take you to a list of all of our available [facility], including address, phone number and hours of operation. Please do not delay care.  [facility]  If you or a family member do not have a primary care  provider, use the link below to schedule a visit and establish care. When you choose a [facility] primary care physician or advanced practice provider, you gain a long-term partner in health. Find a Primary Care Provider  Learn more about [facility]'s in-office and virtual care options: [facility]

## 2022-05-18 NOTE — Progress Notes (Signed)
Virtual Visit Consent   Connor [name], you are scheduled for a virtual visit with a [facility] provider today. Just as with appointments in the office, your consent must be obtained to participate. Your consent will be active for this visit and any virtual visit you may have with one of our providers in the next 365 days. If you have a MyChart account, a copy of this consent can be sent to you electronically.  As this is a virtual visit, video technology does not allow for your provider to perform a traditional examination. This may limit your provider's ability to fully assess your condition. If your provider identifies any concerns that need to be evaluated in person or the need to arrange testing (such as labs, EKG, etc.), we will make arrangements to do so. Although advances in technology are sophisticated, we cannot ensure that it will always work on either your end or our end. If the connection with a video visit is poor, the visit may have to be switched to a telephone visit. With either a video or telephone visit, we are not always able to ensure that we have a secure connection.  By engaging in this virtual visit, you consent to the provision of healthcare and authorize for your insurance to be billed (if applicable) for the services provided during this visit. Depending on your insurance coverage, you may receive a charge related to this service.  I need to obtain your verbal consent now. Are you willing to proceed with your visit today? ANANTH FIALLOS has provided verbal consent on 05/18/2022 for a virtual visit (video or telephone). Connor [name], [place]  Date: 05/18/2022 12:30 PM  Virtual Visit via Video Note   I, Connor [name], connected with  GRACIN MCPARTLAND  ([id number], 27-Feb-1986) on 05/18/22 at 12:00 PM EST by a video-enabled telemedicine application and verified that I am speaking with the correct person using two identifiers.  Location: Patient: Virtual  Visit Location Patient: Home Provider: Virtual Visit Location Provider: Home Office   I discussed the limitations of evaluation and management by telemedicine and the availability of in person appointments. The patient expressed understanding and agreed to proceed.    History of Present Illness: Connor [name] is a 37 y.o. who identifies as a male who was assigned male at birth, and is being seen today for concerns of infection after getting over COVID-19. Notes had initial symptom onset 2 weeks ago at time of diagnosis. Was prescribed antiviral by his ID specialist. Endorses he took the Paxlovid for a few days but stopped due to side effects. Overall symptoms were getting better and mostly resolved with some lingering occasional cough and congestion but over past day with recurrence of fever, some chest wall tenderness and cough that is mostly sporadic and productive of thick sputum. Tmax 102. Denies sinus pain, ear pain or tooth pain. Denies SOB.   Has medical history of HIV, currently on Cabenuva. Last labs 12/2021 with normal blood counts, HIV RNA detected but very low at 26, CD4 count at 567 (still in normal range)  Feeling better.  HPI: HPI  Problems:  Patient Active Problem List   Diagnosis Date Noted   Routine screening for STI (sexually transmitted infection) 01/03/2022   Upper respiratory infection, acute 01/18/2021   Sore throat 01/18/2021   Cough 01/18/2021   Tinea corporis 03/05/2020   Prediabetes 09/13/2018   GERD (gastroesophageal reflux disease) 09/13/2018   Obesity (BMI 30-39.9) 06/01/2018   Vitamin  D deficiency 05/16/2016   Anxiety and depression 05/16/2016   Insomnia 06/28/2011   Human immunodeficiency virus (HIV) disease ([place]) 07/13/2009   Ulcerative colitis ([place]) 07/06/2009    Allergies: No Known Allergies Medications:  Current Outpatient Medications:    cabotegravir & rilpivirine ER (CABENUVA) 600 & 900 MG/3ML injection, Inject 1 kit into the muscle every 2  (two) months., Disp: 6 mL, Rfl: 5   FLUoxetine (PROZAC) 10 MG capsule, Take 1 capsule by mouth (take with 20mg  cap for a total of 30mg ) once a day 30 days, Disp: 30 capsule, Rfl: 0   FLUoxetine (PROZAC) 20 MG capsule, Take 1 capsule by mouth (take with 10mg  cap for a total of 30mg ) once a day 30 days, Disp: 30 capsule, Rfl: 0   fluticasone (FLONASE) 50 MCG/ACT nasal spray, Place 2 sprays into both nostrils daily. (Patient not taking: Reported on 12/07/2020), Disp: 16 g, Rfl: 0   omeprazole (PRILOSEC) 40 MG capsule, Take 1 capsule (40 mg total) by mouth daily., Disp: 30 capsule, Rfl: 5   rosuvastatin (CRESTOR) 20 MG tablet, Take 1 tablet (20 mg total) by mouth daily., Disp: 30 tablet, Rfl: 11   trimethoprim-polymyxin b (POLYTRIM) ophthalmic solution, Place 1-2 drops into the left eye every 4 (four) hours., Disp: 10 mL, Rfl: 0  Observations/Objective: Patient is well-developed, well-nourished in no acute distress.  Resting comfortably at home.  Head is normocephalic, atraumatic.  No labored breathing. Speech is clear and coherent with logical content.  Patient is alert and oriented at baseline.   Assessment and Plan: 1. Acute bacterial bronchitis  Cannot fully rule out a CAP. Normal blood counts, low HIV RNA and normal CD4 so less concern for rarer etiologies. Will start Doxycycline BID. Tessalon per orders. Supportive measures and OTC medications reviewed. Very strict in person precautions discussed. Want him to be seen ASAP if symptoms not improving within 48 hours or if anything worsens at all.   Follow Up Instructions: I discussed the assessment and treatment plan with the patient. The patient was provided an opportunity to ask questions and all were answered. The patient agreed with the plan and demonstrated an understanding of the instructions.  A copy of instructions were sent to the patient via MyChart unless otherwise noted below.   The patient was advised to call back or seek an  in-person evaluation if the symptoms worsen or if the condition fails to improve as anticipated.  Time:  I spent 10 minutes with the patient via telehealth technology discussing the above problems/concerns.    Connor Rio, PA-C

## 2022-05-20 ENCOUNTER — Emergency Department (HOSPITAL_BASED_OUTPATIENT_CLINIC_OR_DEPARTMENT_OTHER)
Admission: EM | Admit: 2022-05-20 | Discharge: 2022-05-20 | Disposition: A | Discharge status: Home / Self Care | Payer: BC Managed Care – PPO | Attending: Emergency Medicine | Admitting: Emergency Medicine

## 2022-05-20 ENCOUNTER — Emergency Department (HOSPITAL_BASED_OUTPATIENT_CLINIC_OR_DEPARTMENT_OTHER): Payer: BC Managed Care – PPO

## 2022-05-20 ENCOUNTER — Encounter (HOSPITAL_BASED_OUTPATIENT_CLINIC_OR_DEPARTMENT_OTHER): Payer: Self-pay | Admitting: Emergency Medicine

## 2022-05-20 ENCOUNTER — Other Ambulatory Visit: Payer: Self-pay

## 2022-05-20 DIAGNOSIS — Z21 Asymptomatic human immunodeficiency virus [HIV] infection status: Secondary | ICD-10-CM | POA: Insufficient documentation

## 2022-05-20 DIAGNOSIS — U071 COVID-19: Secondary | ICD-10-CM | POA: Diagnosis not present

## 2022-05-20 DIAGNOSIS — R509 Fever, unspecified: Secondary | ICD-10-CM | POA: Diagnosis present

## 2022-05-20 LAB — CBC WITH DIFFERENTIAL/PLATELET
Abs Immature Granulocytes: 0.01 10*3/uL (ref 0.00–0.07)
Basophils Absolute: 0 10*3/uL (ref 0.0–0.1)
Basophils Relative: 0 %
Eosinophils Absolute: 0 10*3/uL (ref 0.0–0.5)
Eosinophils Relative: 0 %
HCT: 39.9 % (ref 39.0–52.0)
Hemoglobin: 12.9 g/dL — ABNORMAL LOW (ref 13.0–17.0)
Immature Granulocytes: 0 %
Lymphocytes Relative: 39 %
Lymphs Abs: 2.1 10*3/uL (ref 0.7–4.0)
MCH: 28.7 pg (ref 26.0–34.0)
MCHC: 32.3 g/dL (ref 30.0–36.0)
MCV: 88.9 fL (ref 80.0–100.0)
Monocytes Absolute: 1 10*3/uL (ref 0.1–1.0)
Monocytes Relative: 18 %
Neutro Abs: 2.3 10*3/uL (ref 1.7–7.7)
Neutrophils Relative %: 43 %
Platelets: 169 10*3/uL (ref 150–400)
RBC: 4.49 MIL/uL (ref 4.22–5.81)
RDW: 12.5 % (ref 11.5–15.5)
WBC: 5.4 10*3/uL (ref 4.0–10.5)
nRBC: 0 % (ref 0.0–0.2)

## 2022-05-20 LAB — RESP PANEL BY RT-PCR (RSV, FLU A&B, COVID)  RVPGX2
Influenza A by PCR: NEGATIVE
Influenza B by PCR: NEGATIVE
Resp Syncytial Virus by PCR: NEGATIVE
SARS Coronavirus 2 by RT PCR: POSITIVE — AB

## 2022-05-20 LAB — D-DIMER, QUANTITATIVE: D-Dimer, Quant: 0.75 ug/mL-FEU — ABNORMAL HIGH (ref 0.00–0.50)

## 2022-05-20 LAB — URINALYSIS, W/ REFLEX TO CULTURE (INFECTION SUSPECTED)
Bilirubin Urine: NEGATIVE
Glucose, UA: NEGATIVE mg/dL
Hgb urine dipstick: NEGATIVE
Ketones, ur: NEGATIVE mg/dL
Leukocytes,Ua: NEGATIVE
Nitrite: NEGATIVE
Protein, ur: NEGATIVE mg/dL
Specific Gravity, Urine: <=1.005 (ref 1.005–1.030)
pH: 5 (ref 5.0–8.0)

## 2022-05-20 LAB — COMPREHENSIVE METABOLIC PANEL
ALT: 14 U/L (ref 0–44)
AST: 23 U/L (ref 15–41)
Albumin: 3.8 g/dL (ref 3.5–5.0)
Alkaline Phosphatase: 71 U/L (ref 38–126)
Anion gap: 6 (ref 5–15)
BUN: 12 mg/dL (ref 6–20)
CO2: 25 mmol/L (ref 22–32)
Calcium: 8.8 mg/dL — ABNORMAL LOW (ref 8.9–10.3)
Chloride: 107 mmol/L (ref 98–111)
Creatinine, Ser: 1.02 mg/dL (ref 0.61–1.24)
GFR, Estimated: >60 mL/min (ref >60)
Glucose, Bld: 104 mg/dL — ABNORMAL HIGH (ref 70–99)
Potassium: 3.8 mmol/L (ref 3.5–5.1)
Sodium: 138 mmol/L (ref 135–145)
Total Bilirubin: 0.5 mg/dL (ref 0.3–1.2)
Total Protein: 7.4 g/dL (ref 6.5–8.1)

## 2022-05-20 LAB — T-HELPER CELLS (CD4) COUNT (NOT AT ARMC)
CD4 % Helper T Cell: 26 % — ABNORMAL LOW (ref 33–65)
CD4 T Cell Abs: 526 /uL (ref 400–1790)

## 2022-05-20 LAB — TROPONIN I (HIGH SENSITIVITY): Troponin I (High Sensitivity): 5 ng/L (ref <18)

## 2022-05-20 MED ORDER — IOHEXOL 350 MG/ML SOLN
100.0000 mL | Freq: Once | INTRAVENOUS | Status: AC | PRN
  Administered 2022-05-20: 100 mL via INTRAVENOUS

## 2022-05-20 MED ORDER — LACTATED RINGERS IV BOLUS
1000.0000 mL | Freq: Once | INTRAVENOUS | Status: AC
  Administered 2022-05-20: 1000 mL via INTRAVENOUS

## 2022-05-20 NOTE — ED Provider Notes (Signed)
[place] [facility] [place] Provider Note   CSN: [id number] Arrival date & time: 05/20/22  6010     History  Chief Complaint  Patient presents with   Fever    Connor [name] is a 37 y.o. male.  HPI 37 year old male with a history of HIV presents with fever.  Over a week ago he had COVID which she notes from a positive home COVID test.  He also had a fever then those symptoms resolved.  He felt tired but had no other significant symptoms.  However starting on 1/30 he developed a fever again.  Has been as high as 102.6.  He has a slight headache that he rates as a 2 out of 10 that comes and goes but seems to come and go with the fever.  He has no other symptoms including no cough, shortness of breath, diarrhea, urinary symptoms, back pain, neck stiffness.  He does endorse a couple episodes of vomiting.  He states that both of his lower ribs seem to hurt and it feels like is on the inside but he has no cough. No rash.  Home Medications Prior to Admission medications   Medication Sig Start Date End Date Taking? Authorizing Provider  benzonatate (TESSALON) 100 MG capsule Take 1 capsule (100 mg total) by mouth 3 (three) times daily as needed for cough. 05/18/22   Brunetta Jeans, PA-C  cabotegravir & rilpivirine ER (CABENUVA) 600 & 900 MG/3ML injection Inject 1 kit into the muscle every 2 (two) months. 02/08/21   Kuppelweiser, Cassie L, RPH-CPP  doxycycline (VIBRA-TABS) 100 MG tablet Take 1 tablet (100 mg total) by mouth 2 (two) times daily. 05/18/22   Brunetta Jeans, PA-C  FLUoxetine (PROZAC) 10 MG capsule Take 1 capsule by mouth (take with 20mg  cap for a total of 30mg ) once a day 30 days 10/11/21     FLUoxetine (PROZAC) 20 MG capsule Take 1 capsule by mouth (take with 10mg  cap for a total of 30mg ) once a day 30 days 10/11/21     fluticasone (FLONASE) 50 MCG/ACT nasal spray Place 2 sprays into both nostrils daily. Patient not taking: Reported on 12/07/2020  07/06/20   Noe Gens, PA-C  omeprazole (PRILOSEC) 40 MG capsule Take 1 capsule (40 mg total) by mouth daily. 09/13/18   Jearld Fenton, NP  rosuvastatin (CRESTOR) 20 MG tablet Take 1 tablet (20 mg total) by mouth daily. 01/03/22   Truman Hayward, MD  trimethoprim-polymyxin b [facility]) ophthalmic solution Place 1-2 drops into the left eye every 4 (four) hours. 09/09/21   Perlie Mayo, NP      Allergies    Patient has no known allergies.    Review of Systems   Review of Systems  Constitutional:  Positive for fever.  HENT:  Negative for sore throat.   Respiratory:  Negative for cough and shortness of breath.   Cardiovascular:  Positive for chest pain.  Gastrointestinal:  Positive for vomiting. Negative for abdominal pain.  Genitourinary:  Negative for dysuria.  Musculoskeletal:  Negative for neck pain and neck stiffness.  Neurological:  Positive for headaches.    Physical Exam Updated Vital Signs BP 117/63   Pulse 78   Temp 98.5 F (36.9 C) (Oral)   Resp 18   Ht 5\' 11"  (1.803 m)   Wt 99.8 kg   SpO2 100%   BMI 30.68 kg/m  Physical Exam Vitals and nursing note reviewed.  Constitutional:  Appearance: He is well-developed.  HENT:     Head: Normocephalic and atraumatic.  Cardiovascular:     Rate and Rhythm: Normal rate and regular rhythm.     Heart sounds: Normal heart sounds.  Pulmonary:     Effort: Pulmonary effort is normal.     Breath sounds: Normal breath sounds. No wheezing.  Chest:     Chest wall: No tenderness.  Abdominal:     General: There is no distension.     Palpations: Abdomen is soft.     Tenderness: There is no abdominal tenderness.  Musculoskeletal:     Cervical back: No rigidity.     Comments: No midline back tenderness  Skin:    General: Skin is warm and dry.  Neurological:     Mental Status: He is alert.     ED Results / Procedures / Treatments   Labs (all labs ordered are listed, but only abnormal results are displayed) Labs  Reviewed  RESP PANEL BY RT-PCR (RSV, FLU A&B, COVID)  RVPGX2 - Abnormal; Notable for the following components:      Result Value   SARS Coronavirus 2 by RT PCR POSITIVE (*)    All other components within normal limits  COMPREHENSIVE METABOLIC PANEL - Abnormal; Notable for the following components:   Glucose, Bld 104 (*)    Calcium 8.8 (*)    All other components within normal limits  CBC WITH DIFFERENTIAL/PLATELET - Abnormal; Notable for the following components:   Hemoglobin 12.9 (*)    All other components within normal limits  URINALYSIS, W/ REFLEX TO CULTURE (INFECTION SUSPECTED) - Abnormal; Notable for the following components:   Bacteria, UA RARE (*)    All other components within normal limits  D-DIMER, QUANTITATIVE - Abnormal; Notable for the following components:   D-Dimer, Quant 0.75 (*)    All other components within normal limits  CULTURE, BLOOD (ROUTINE X 2)  CULTURE, BLOOD (ROUTINE X 2)  HIV-1 RNA QUANT-NO REFLEX-BLD  T-HELPER CELLS (CD4) COUNT (NOT AT [facility])  TROPONIN I (HIGH SENSITIVITY)    EKG EKG Interpretation  Date/Time:  Friday May 20 2022 09:16:45 EST Ventricular Rate:  77 PR Interval:  150 QRS Duration: 86 QT Interval:  382 QTC Calculation: 433 R Axis:   -5 Text Interpretation: Sinus rhythm No old tracing to compare Confirmed by Pricilla Loveless [phone]) on 05/20/2022 9:24:46 AM  Radiology CT Angio Chest PE W and/or Wo Contrast  Result Date: 05/20/2022 CLINICAL DATA:  Day history of fever and recent diagnosis of COVID infection associated with bilateral rib pain EXAM: CT ANGIOGRAPHY CHEST WITH CONTRAST TECHNIQUE: Multidetector CT imaging of the chest was performed using the standard protocol during bolus administration of intravenous contrast. Multiplanar CT image reconstructions and MIPs were obtained to evaluate the vascular anatomy. RADIATION DOSE REDUCTION: This exam was performed according to the departmental dose-optimization program which includes  automated exposure control, adjustment of the mA and/or kV according to patient size and/or use of iterative reconstruction technique. CONTRAST:  OMNIPAQUE IOHEXOL 350 MG/ML SOLN COMPARISON:  Chest radiograph dated 05/20/2022 FINDINGS: Cardiovascular: The study is high quality for the evaluation of pulmonary embolism. There are no filling defects in the central, lobar, segmental or subsegmental pulmonary artery branches to suggest acute pulmonary embolism. Great vessels are normal in course and caliber. Normal heart size. No significant pericardial fluid/thickening. Mediastinum/Nodes: Imaged thyroid gland without nodules meeting criteria for imaging follow-up by size. Normal esophagus. No pathologically enlarged axillary, supraclavicular, mediastinal, or hilar lymph nodes.  Lungs/Pleura: The central airways are patent. Subpleural 3 mm ground-glass nodule in the right middle lobe (5:49). No focal consolidation. No pneumothorax. No pleural effusion. Upper abdomen: Small, fat containing left posteromedial hemidiaphragmatic hernia. Musculoskeletal: No acute or abnormal lytic or blastic osseous lesions. Review of the MIP images confirms the above findings. IMPRESSION: 1. No evidence of pulmonary embolism. 2. Subpleural 3 mm ground-glass nodule in the right middle lobe may be infectious/inflammatory. No follow-up recommended. This recommendation follows the consensus statement: Guidelines for Management of Incidental Pulmonary Nodules Detected on CT Images: From the Fleischner Society 2017; Radiology 2017; 284:228-243. Electronically Signed   By: Darrin Nipper M.D.   On: 05/20/2022 11:28   DG Chest 2 View  Result Date: 05/20/2022 CLINICAL DATA:  Fever for 4 days.  Bilateral rib pain. EXAM: CHEST - 2 VIEW COMPARISON:  Chest radiograph 07/03/2009 FINDINGS: The cardiomediastinal silhouette is normal. There is no focal consolidation or pulmonary edema. There is no pleural effusion or pneumothorax There is no acute osseous  abnormality. No displaced rib fracture is identified. There is mild levocurvature of the upper thoracic spine. IMPRESSION: No radiographic evidence of acute cardiopulmonary process. No rib fracture or other acute osseous abnormality identified. Electronically Signed   By: Valetta Mole M.D.   On: 05/20/2022 08:47    Procedures Procedures    Medications Ordered in ED Medications  lactated ringers bolus 1,000 mL (0 mLs Intravenous Stopped 05/20/22 1104)  iohexol (OMNIPAQUE) 350 MG/ML injection 100 mL (100 mLs Intravenous Contrast Given 05/20/22 1058)    ED Course/ Medical Decision Making/ A&P                             Medical Decision Making Amount and/or Complexity of Data Reviewed Labs: ordered.    Details: COVID test is positive.  WBC is normal, electrolytes and LFTs are unremarkable.  UA unremarkable.  D-dimer mildly elevated. Radiology: ordered and independent interpretation performed.    Details: Chest x-ray without pneumonia.  CT without lobar pneumonia or PE. ECG/medicine tests: ordered and independent interpretation performed.    Details: No acute ischemia.  Risk Prescription drug management.   Unclear why patient is having a fever.  His most recent CD4 count was over 500.  He otherwise has not had a fever in the multiple hours has been here.  His COVID test is positive though he reported a home COVID test 10 days ago or so.  This could be a different viral illness versus a rebound COVID illness given either Paxlovid or just COVID itself.  He does have a slight headache and we discussed possibilities for this and I think this is likely related to fever/illness but did discuss we could do a lumbar puncture.  We have both agreed to hold off on LP as I think CNS infections pretty unlikely.  PE study is negative for PE but does show a very small focus that could be pneumonia.  Thus I will advise him to continue his doxycycline.  Given his HIV blood cultures were obtained and certainly if  these are positive he will need to come back but he appears so well at this time that I think it is reasonable to discharge home with close outpatient follow-up with ID.  Patient in agreement.  Discussed return precautions.        Final Clinical Impression(s) / ED Diagnoses Final diagnoses:  Fever in adult    Rx / DC Orders ED Discharge  Orders     None         Sherwood Gambler, MD 05/20/22 1245

## 2022-05-20 NOTE — ED Notes (Signed)
ED Provider at bedside. 

## 2022-05-20 NOTE — ED Triage Notes (Signed)
Pt arrives pov, steady gait, endorse covid x 1 week, endorses fever x 4 days. Pt denies shob or cough, endorses bilateral rib pain Endorses dual action this am pta

## 2022-05-20 NOTE — Discharge Instructions (Signed)
Continue taking your antibiotics.  Is unclear if this fever is related to a rebound effect from COVID versus a new infection.  Blood cultures have been sent and if these are growing any bacteria you will be called.  Otherwise, follow-up closely with the infectious disease doctors.  If you develop continued or worsening fever, severe headache, neck pain or stiffness, vision complaints, or any other new/concerning symptoms then return to the ER or call 911.

## 2022-05-21 LAB — HIV-1 RNA QUANT-NO REFLEX-BLD
HIV 1 RNA Quant: <20 copies/mL
LOG10 HIV-1 RNA: UNDETERMINED log10copy/mL

## 2022-05-25 LAB — CULTURE, BLOOD (ROUTINE X 2)
Culture: NO GROWTH
Culture: NO GROWTH
Special Requests: ADEQUATE
Special Requests: ADEQUATE

## 2022-07-07 ENCOUNTER — Other Ambulatory Visit (HOSPITAL_COMMUNITY): Payer: Self-pay

## 2022-07-11 ENCOUNTER — Other Ambulatory Visit: Payer: Self-pay

## 2022-07-11 ENCOUNTER — Other Ambulatory Visit (HOSPITAL_COMMUNITY)
Admission: RE | Admit: 2022-07-11 | Discharge: 2022-07-11 | Disposition: A | Discharge status: Home / Self Care | Payer: BC Managed Care – PPO | Source: Ambulatory Visit | Attending: Infectious Disease | Admitting: Infectious Disease

## 2022-07-11 ENCOUNTER — Ambulatory Visit (INDEPENDENT_AMBULATORY_CARE_PROVIDER_SITE_OTHER): Payer: BC Managed Care – PPO | Admitting: Pharmacist

## 2022-07-11 DIAGNOSIS — Z113 Encounter for screening for infections with a predominantly sexual mode of transmission: Secondary | ICD-10-CM | POA: Insufficient documentation

## 2022-07-11 DIAGNOSIS — B20 Human immunodeficiency virus [HIV] disease: Secondary | ICD-10-CM

## 2022-07-11 MED ORDER — CABOTEGRAVIR & RILPIVIRINE ER 600 & 900 MG/3ML IM SUER
1.0000 | Freq: Once | INTRAMUSCULAR | Status: AC
  Administered 2022-07-11: 1 via INTRAMUSCULAR

## 2022-07-11 NOTE — Progress Notes (Signed)
HPI: Connor [name] is a 37 y.o. male who presents to the [place] clinic for [place] administration.  Patient Active Problem List   Diagnosis Date Noted   Routine screening for STI (sexually transmitted infection) 01/03/2022   Upper respiratory infection, acute 01/18/2021   Sore throat 01/18/2021   Cough 01/18/2021   Tinea corporis 03/05/2020   Prediabetes 09/13/2018   GERD (gastroesophageal reflux disease) 09/13/2018   Obesity (BMI 30-39.9) 06/01/2018   Vitamin D deficiency 05/16/2016   Anxiety and depression 05/16/2016   Insomnia 06/28/2011   Human immunodeficiency virus (HIV) disease ([place]) 07/13/2009   Ulcerative colitis ([place]) 07/06/2009    Patient's Medications  New Prescriptions   No medications on file  Previous Medications   BENZONATATE (TESSALON) 100 MG CAPSULE    Take 1 capsule (100 mg total) by mouth 3 (three) times daily as needed for cough.   CABOTEGRAVIR & RILPIVIRINE ER (CABENUVA) 600 & 900 MG/3ML INJECTION    Inject 1 kit into the muscle every 2 (two) months.   DOXYCYCLINE (VIBRA-TABS) 100 MG TABLET    Take 1 tablet (100 mg total) by mouth 2 (two) times daily.   FLUOXETINE (PROZAC) 10 MG CAPSULE    Take 1 capsule by mouth (take with 20mg  cap for a total of 30mg ) once a day 30 days   FLUOXETINE (PROZAC) 20 MG CAPSULE    Take 1 capsule by mouth (take with 10mg  cap for a total of 30mg ) once a day 30 days   FLUTICASONE (FLONASE) 50 MCG/ACT NASAL SPRAY    Place 2 sprays into both nostrils daily.   OMEPRAZOLE (PRILOSEC) 40 MG CAPSULE    Take 1 capsule (40 mg total) by mouth daily.   ROSUVASTATIN (CRESTOR) 20 MG TABLET    Take 1 tablet (20 mg total) by mouth daily.   TRIMETHOPRIM-POLYMYXIN B (POLYTRIM) OPHTHALMIC SOLUTION    Place 1-2 drops into the left eye every 4 (four) hours.  Modified Medications   No medications on file  Discontinued Medications   No medications on file    Allergies: No Known Allergies  Past Medical History: Past Medical History:   Diagnosis Date   Anxiety    Cough 01/18/2021   Depression    History of syphilis    HIV infection ([place])    Routine screening for STI (sexually transmitted infection) 01/03/2022   Sore throat 01/18/2021   Ulcerative colitis ([place])    Upper respiratory infection, acute 01/18/2021   Vitamin D deficiency     Social History: Social History   Socioeconomic History   Marital status: Single    Spouse name: Not on file   Number of children: Not on file   Years of education: Not on file   Highest education level: Not on file  Occupational History   Not on file  Tobacco Use   Smoking status: Never   Smokeless tobacco: Never  Vaping Use   Vaping Use: Never used  Substance and Sexual Activity   Alcohol use: No    Alcohol/week: 0.0 standard drinks of alcohol   Drug use: No   Sexual activity: Yes    Partners: Female, Male    Birth control/protection: Condom    Comment: refused condoms  Other Topics Concern   Not on file  Social History Narrative   Not on file   Social Determinants of Health   Financial Resource Strain: Not on file  Food Insecurity: Not on file  Transportation Needs: Not on file  Physical Activity:  Not on file  Stress: Not on file  Social Connections: Not on file    Labs: Lab Results  Component Value Date   HIV1RNAQUANT <20 05/20/2022   HIV1RNAQUANT Not Detected 01/03/2022   HIV1RNAQUANT 26 (H) 09/06/2021   CD4TABS 526 05/20/2022   CD4TABS 567 01/03/2022   CD4TABS 713 01/11/2021    RPR and STI Lab Results  Component Value Date   LABRPR REACTIVE (A) 01/03/2022   LABRPR REACTIVE (A) 09/06/2021   LABRPR REACTIVE (A) 05/10/2021   LABRPR REACTIVE (A) 11/16/2020   LABRPR REACTIVE (A) 02/19/2020   RPRTITER 1:4 (H) 01/03/2022   RPRTITER 1:4 (H) 09/06/2021   RPRTITER 1:4 (H) 05/10/2021   RPRTITER 1:4 (H) 11/16/2020   RPRTITER 1:4 (H) 02/19/2020    STI Results GC CT  01/03/2022  9:06 AM Negative    Negative    Negative  Negative    Negative     Negative   09/06/2021  9:14 AM Negative  Negative   03/08/2021  9:16 AM Negative  Negative   11/16/2020 10:53 AM Negative  Negative   05/07/2019  3:08 PM Negative  Negative   11/20/2017 12:00 AM Negative  Negative   07/19/2016 12:00 AM Negative  Negative   09/28/2015 12:00 AM Negative  Negative     Hepatitis B Lab Results  Component Value Date   HEPBSAB REACTIVE (A) 09/06/2021   HEPBSAG NEG 07/13/2009   HEPBCAB NEG 07/13/2009   Hepatitis C No results found for: "HEPCAB", "HCVRNAPCRQN" Hepatitis A Lab Results  Component Value Date   HAV POS (A) 07/13/2009   Lipids: Lab Results  Component Value Date   CHOL 182 01/03/2022   TRIG 70 01/03/2022   HDL 43 01/03/2022   CHOLHDL 4.2 01/03/2022   VLDL 14.2 07/27/2017   LDLCALC 123 (H) 01/03/2022    TARGET DATE: The 24th  Assessment: Connor [name] presents today for his maintenance Cabenuva injections. Past injections were tolerated well without issues. Will defer HIV RNA to next appointment as this was collected during his last appointment and he has remained undetectable. The patient requests full STI testing today with RPR and oral/urine/rectal cytologies. The patient is eligible for the updated COVID vaccine but had COVID in January, 2024 so we will wait until his next appointment to administer the vaccine in order to comply with the 90-day wait period from COVID infection. He reports receiving the flu vaccine earlier this season.  Administered cabotegravir 600mg /98mL in left upper outer quadrant of the gluteal muscle. Administered rilpivirine 900 mg/34mL in the right upper outer quadrant of the gluteal muscle. No issues with injections. He will follow up in 2 months for next set of injections.  Plan: - Cabenuva injections administered - Get RPR, oral/urine/rectal cytologies today  - Next injections scheduled for 09/05/22 with Dr. Tommy Medal and 11/07/22 with Cassie - Follow-up administration of COVID vaccine during May appt  - Call with any  issues or questions   Billey [name], PharmD PGY1 Pharmacy Resident 3/25/20249:21 AM

## 2022-07-12 LAB — URINE CYTOLOGY ANCILLARY ONLY
Chlamydia: NEGATIVE
Comment: NEGATIVE
Comment: NORMAL
Neisseria Gonorrhea: NEGATIVE

## 2022-07-12 LAB — CYTOLOGY, (ORAL, ANAL, URETHRAL) ANCILLARY ONLY
Chlamydia: NEGATIVE
Chlamydia: NEGATIVE
Comment: NEGATIVE
Comment: NEGATIVE
Comment: NORMAL
Comment: NORMAL
Neisseria Gonorrhea: NEGATIVE
Neisseria Gonorrhea: POSITIVE — AB

## 2022-07-13 ENCOUNTER — Telehealth: Payer: Self-pay

## 2022-07-13 ENCOUNTER — Other Ambulatory Visit (HOSPITAL_COMMUNITY): Payer: Self-pay

## 2022-07-13 LAB — T PALLIDUM AB: T Pallidum Abs: POSITIVE — AB

## 2022-07-13 LAB — RPR: RPR Ser Ql: REACTIVE — AB

## 2022-07-13 LAB — RPR TITER: RPR Titer: 1:2 {titer} — ABNORMAL HIGH

## 2022-07-13 NOTE — Telephone Encounter (Signed)
Called patient to inform him of positive STI lab results. Left him a voicemail asking that he call us back for more information.

## 2022-07-13 NOTE — Telephone Encounter (Signed)
Patient called back and I explained his positive oral cytology results for gonorrhea. We made an appointment for Monday, April 1st for treatment with ceftriaxone. He will need to follow-up 14 days post-treatment for a test of cure.

## 2022-07-18 ENCOUNTER — Other Ambulatory Visit: Payer: Self-pay

## 2022-07-18 ENCOUNTER — Ambulatory Visit (INDEPENDENT_AMBULATORY_CARE_PROVIDER_SITE_OTHER): Payer: BC Managed Care – PPO | Admitting: Pharmacist

## 2022-07-18 DIAGNOSIS — A549 Gonococcal infection, unspecified: Secondary | ICD-10-CM

## 2022-07-18 DIAGNOSIS — B20 Human immunodeficiency virus [HIV] disease: Secondary | ICD-10-CM

## 2022-07-18 MED ORDER — CEFTRIAXONE SODIUM 500 MG IJ SOLR
500.0000 mg | Freq: Once | INTRAMUSCULAR | Status: AC
  Administered 2022-07-18: 500 mg via INTRAMUSCULAR

## 2022-07-18 NOTE — Progress Notes (Signed)
HPI: Connor [name] is a 37 y.o. male who presents to the [place] clinic for gonorrhea treatment.  Patient Active Problem List   Diagnosis Date Noted   Routine screening for STI (sexually transmitted infection) 01/03/2022   Upper respiratory infection, acute 01/18/2021   Sore throat 01/18/2021   Cough 01/18/2021   Tinea corporis 03/05/2020   Prediabetes 09/13/2018   GERD (gastroesophageal reflux disease) 09/13/2018   Obesity (BMI 30-39.9) 06/01/2018   Vitamin D deficiency 05/16/2016   Anxiety and depression 05/16/2016   Insomnia 06/28/2011   Human immunodeficiency virus (HIV) disease 07/13/2009   Ulcerative colitis 07/06/2009    Patient's Medications  New Prescriptions   No medications on file  Previous Medications   BENZONATATE (TESSALON) 100 MG CAPSULE    Take 1 capsule (100 mg total) by mouth 3 (three) times daily as needed for cough.   CABOTEGRAVIR & RILPIVIRINE ER (CABENUVA) 600 & 900 MG/3ML INJECTION    Inject 1 kit into the muscle every 2 (two) months.   DOXYCYCLINE (VIBRA-TABS) 100 MG TABLET    Take 1 tablet (100 mg total) by mouth 2 (two) times daily.   FLUOXETINE (PROZAC) 10 MG CAPSULE    Take 1 capsule by mouth (take with 20mg  cap for a total of 30mg ) once a day 30 days   FLUOXETINE (PROZAC) 20 MG CAPSULE    Take 1 capsule by mouth (take with 10mg  cap for a total of 30mg ) once a day 30 days   FLUTICASONE (FLONASE) 50 MCG/ACT NASAL SPRAY    Place 2 sprays into both nostrils daily.   OMEPRAZOLE (PRILOSEC) 40 MG CAPSULE    Take 1 capsule (40 mg total) by mouth daily.   ROSUVASTATIN (CRESTOR) 20 MG TABLET    Take 1 tablet (20 mg total) by mouth daily.   TRIMETHOPRIM-POLYMYXIN B (POLYTRIM) OPHTHALMIC SOLUTION    Place 1-2 drops into the left eye every 4 (four) hours.  Modified Medications   No medications on file  Discontinued Medications   No medications on file    Allergies: No Known Allergies  Past Medical History: Past Medical History:  Diagnosis  Date   Anxiety    Cough 01/18/2021   Depression    History of syphilis    HIV infection ([place])    Routine screening for STI (sexually transmitted infection) 01/03/2022   Sore throat 01/18/2021   Ulcerative colitis ([place])    Upper respiratory infection, acute 01/18/2021   Vitamin D deficiency     Social History: Social History   Socioeconomic History   Marital status: Single    Spouse name: Not on file   Number of children: Not on file   Years of education: Not on file   Highest education level: Not on file  Occupational History   Not on file  Tobacco Use   Smoking status: Never   Smokeless tobacco: Never  Vaping Use   Vaping Use: Never used  Substance and Sexual Activity   Alcohol use: No    Alcohol/week: 0.0 standard drinks of alcohol   Drug use: No   Sexual activity: Yes    Partners: Female, Male    Birth control/protection: Condom    Comment: refused condoms  Other Topics Concern   Not on file  Social History Narrative   Not on file   Social Determinants of Health   Financial Resource Strain: Not on file  Food Insecurity: Not on file  Transportation Needs: Not on file  Physical Activity: Not on  file  Stress: Not on file  Social Connections: Not on file    Labs: Lab Results  Component Value Date   HIV1RNAQUANT <20 05/20/2022   HIV1RNAQUANT Not Detected 01/03/2022   HIV1RNAQUANT 26 (H) 09/06/2021   CD4TABS 526 05/20/2022   CD4TABS 567 01/03/2022   CD4TABS 713 01/11/2021    RPR and STI Lab Results  Component Value Date   LABRPR REACTIVE (A) 07/11/2022   LABRPR REACTIVE (A) 01/03/2022   LABRPR REACTIVE (A) 09/06/2021   LABRPR REACTIVE (A) 05/10/2021   LABRPR REACTIVE (A) 11/16/2020   RPRTITER 1:2 (H) 07/11/2022   RPRTITER 1:4 (H) 01/03/2022   RPRTITER 1:4 (H) 09/06/2021   RPRTITER 1:4 (H) 05/10/2021   RPRTITER 1:4 (H) 11/16/2020    STI Results GC CT  07/11/2022  9:40 AM Negative    Positive    Negative  Negative    Negative    Negative    01/03/2022  9:06 AM Negative    Negative    Negative  Negative    Negative    Negative   09/06/2021  9:14 AM Negative  Negative   03/08/2021  9:16 AM Negative  Negative   11/16/2020 10:53 AM Negative  Negative   05/07/2019  3:08 PM Negative  Negative   11/20/2017 12:00 AM Negative  Negative   07/19/2016 12:00 AM Negative  Negative   09/28/2015 12:00 AM Negative  Negative     Hepatitis B Lab Results  Component Value Date   HEPBSAB REACTIVE (A) 09/06/2021   HEPBSAG NEG 07/13/2009   HEPBCAB NEG 07/13/2009   Hepatitis C No results found for: "HEPCAB", "HCVRNAPCRQN" Hepatitis A Lab Results  Component Value Date   HAV POS (A) 07/13/2009   Lipids: Lab Results  Component Value Date   CHOL 182 01/03/2022   TRIG 70 01/03/2022   HDL 43 01/03/2022   CHOLHDL 4.2 01/03/2022   VLDL 14.2 07/27/2017   LDLCALC 123 (H) 01/03/2022    Assessment: Connor [name] presents today for gonorrhea treatment. His pharyngeal swab returned positive on 07/11/22. Last STI screening was on 01/03/22 and was negative. No known allergies to any antibiotics.   Administered ceftriaxone 500 mg in left upper outer quadrant of the gluteal muscle. He tolerated well. Advised to abstain from sexual activity for 10 days and to ask partners to get tested and treated.  Answered all questions. He will need a repeat cytology drawn in ~14 days. He requested an early Monday morning appointment. Will call with any issues.  Plan: - Administered Ceftriaxone 500 mg IM in gluteal muscle - F/u for repeat swab on 08/08/22 at 845am - Call with any issues or questions  Danessa Mensch L. Tore Carreker, PharmD, BCIDP, AAHIVP, [place] Clinical Pharmacist Practitioner [facility]

## 2022-08-08 ENCOUNTER — Ambulatory Visit (INDEPENDENT_AMBULATORY_CARE_PROVIDER_SITE_OTHER): Payer: BC Managed Care – PPO | Admitting: Pharmacist

## 2022-08-08 ENCOUNTER — Other Ambulatory Visit: Payer: Self-pay

## 2022-08-08 ENCOUNTER — Other Ambulatory Visit (HOSPITAL_COMMUNITY): Payer: Self-pay

## 2022-08-08 ENCOUNTER — Other Ambulatory Visit (HOSPITAL_COMMUNITY)
Admission: RE | Admit: 2022-08-08 | Discharge: 2022-08-08 | Disposition: A | Discharge status: Home / Self Care | Payer: BC Managed Care – PPO | Source: Ambulatory Visit | Attending: Infectious Disease | Admitting: Infectious Disease

## 2022-08-08 DIAGNOSIS — J302 Other seasonal allergic rhinitis: Secondary | ICD-10-CM

## 2022-08-08 DIAGNOSIS — A549 Gonococcal infection, unspecified: Secondary | ICD-10-CM

## 2022-08-08 DIAGNOSIS — T7840XA Allergy, unspecified, initial encounter: Secondary | ICD-10-CM

## 2022-08-08 MED ORDER — MONTELUKAST SODIUM 10 MG PO TABS
10.0000 mg | ORAL_TABLET | Freq: Every day | ORAL | 2 refills | Status: AC
  Filled 2022-08-08: qty 30, 30d supply, fill #0
  Filled 2022-09-05: qty 30, 30d supply, fill #1

## 2022-08-08 NOTE — Progress Notes (Signed)
08/08/2022  HPI: Connor [name] is a 37 y.o. male who presents to the [facility] clinic today for a test of cure visit.  Patient Active Problem List   Diagnosis Date Noted   Routine screening for STI (sexually transmitted infection) 01/03/2022   Upper respiratory infection, acute 01/18/2021   Sore throat 01/18/2021   Cough 01/18/2021   Tinea corporis 03/05/2020   Prediabetes 09/13/2018   GERD (gastroesophageal reflux disease) 09/13/2018   Obesity (BMI 30-39.9) 06/01/2018   Vitamin D deficiency 05/16/2016   Anxiety and depression 05/16/2016   Insomnia 06/28/2011   Human immunodeficiency virus (HIV) disease 07/13/2009   Ulcerative colitis 07/06/2009    Patient's Medications  New Prescriptions   No medications on file  Previous Medications   BENZONATATE (TESSALON) 100 MG CAPSULE    Take 1 capsule (100 mg total) by mouth 3 (three) times daily as needed for cough.   CABOTEGRAVIR & RILPIVIRINE ER (CABENUVA) 600 & 900 MG/3ML INJECTION    Inject 1 kit into the muscle every 2 (two) months.   DOXYCYCLINE (VIBRA-TABS) 100 MG TABLET    Take 1 tablet (100 mg total) by mouth 2 (two) times daily.   FLUOXETINE (PROZAC) 10 MG CAPSULE    Take 1 capsule by mouth (take with  cap for a total of ) once a day 30 days   FLUOXETINE (PROZAC) 20 MG CAPSULE    Take 1 capsule by mouth (take with  cap for a total of ) once a day 30 days   FLUTICASONE (FLONASE) 50 MCG/ACT NASAL SPRAY    Place 2 sprays into both nostrils daily.   OMEPRAZOLE (PRILOSEC) 40 MG CAPSULE    Take 1 capsule (40 mg total) by mouth daily.   ROSUVASTATIN (CRESTOR) 20 MG TABLET    Take 1 tablet (20 mg total) by mouth daily.   TRIMETHOPRIM-POLYMYXIN B (POLYTRIM) OPHTHALMIC SOLUTION    Place 1-2 drops into the left eye every 4 (four) hours.  Modified Medications   No medications on file  Discontinued Medications   No medications on file    Allergies: No Known Allergies  Past Medical History: Past Medical History:   Diagnosis Date   Anxiety    Cough 01/18/2021   Depression    History of syphilis    HIV infection (HCC)    Routine screening for STI (sexually transmitted infection) 01/03/2022   Sore throat 01/18/2021   Ulcerative colitis (HCC)    Upper respiratory infection, acute 01/18/2021   Vitamin D deficiency     Social History: Social History   Socioeconomic History   Marital status: Single    Spouse name: Not on file   Number of children: Not on file   Years of education: Not on file   Highest education level: Not on file  Occupational History   Not on file  Tobacco Use   Smoking status: Never   Smokeless tobacco: Never  Vaping Use   Vaping Use: Never used  Substance and Sexual Activity   Alcohol use: No    Alcohol/week: 0.0 standard drinks of alcohol   Drug use: No   Sexual activity: Yes    Partners: Female, Male    Birth control/protection: Condom    Comment: refused condoms  Other Topics Concern   Not on file  Social History Narrative   Not on file   Social Determinants of Health   Financial Resource Strain: Not on file  Food Insecurity: Not on file  Transportation Needs: Not on file  Physical Activity: Not on file  Stress: Not on file  Social Connections: Not on file     Assessment: Connor [name] is here today for a test of cure visit after testing positive for oral gonorrhea on 07/11/22. He was treated with ceftriaxone on 07/18/22 with no issues. Will retest today.  He is complaining of seasonal allergies. He states that he has tried Xyzal, Claritin, Zyrtec, Allegra, and Flonase with little relief. Will trial Singulair for him. Will send to [facility] at [facility]. Asked him to also take Claritin with it.   Plan: - Repeat oral cytology today - F/u results - Singular 10 mg PO daily for allergies - F/u with Dr. Daiva Eves on 09/05/22 for Cabenuva injections  Francine Hannan L. Tija Biss, PharmD, BCIDP, AAHIVP, CPP Clinical Pharmacist  Practitioner Infectious Diseases Clinical Pharmacist [facility] 08/08/2022, 2:11 PM

## 2022-08-09 LAB — CYTOLOGY, (ORAL, ANAL, URETHRAL) ANCILLARY ONLY
Chlamydia: NEGATIVE
Comment: NEGATIVE
Comment: NORMAL
Neisseria Gonorrhea: NEGATIVE

## 2022-08-16 ENCOUNTER — Telehealth: Payer: Self-pay

## 2022-08-16 NOTE — Telephone Encounter (Signed)
Pharmacy Patient Advocate Encounter- Renaldo Harrison BIV-Medical Benefit:  J code: [id number]  CPT code: 81191  Dx Code: B20  PA was submitted to [facility] and has been approved through: 08/11/22-08/16/23

## 2022-08-18 ENCOUNTER — Telehealth: Payer: BC Managed Care – PPO | Admitting: Physician Assistant

## 2022-08-18 DIAGNOSIS — L03019 Cellulitis of unspecified finger: Secondary | ICD-10-CM

## 2022-08-18 MED ORDER — AMOXICILLIN-POT CLAVULANATE 875-125 MG PO TABS: 1.0000 | ORAL_TABLET | Freq: Two times a day (BID) | ORAL | 0 refills | Status: DC

## 2022-08-18 NOTE — Progress Notes (Signed)
E Visit for Cellulitis  We are sorry that you are not feeling well. Here is how we plan to help!  Based on what you shared with me it looks like you have cellulitis.  Cellulitis looks like areas of skin redness, swelling, and warmth; it develops as a result of bacteria entering under the skin. Little red spots and/or bleeding can be seen in skin, and tiny surface sacs containing fluid can occur. Fever can be present. Cellulitis is almost always on one side of a body, and the lower limbs are the most common site of involvement.   I have prescribed:  Augmentin to take twice daily for 7 days. We just want to be cautious giving medical history. If anything is worsening despite treatment, then I want you to be seen in person.   HOME CARE:  Take your medications as ordered and take all of them, even if the skin irritation appears to be healing.   GET HELP RIGHT AWAY IF:  Symptoms that don't begin to go away within 48 hours. Severe redness persists or worsens If the area turns color, spreads or swells. If it blisters and opens, develops yellow-brown crust or bleeds. You develop a fever or chills. If the pain increases or becomes unbearable.  Are unable to keep fluids and food down.  MAKE SURE YOU   Understand these instructions. Will watch your condition. Will get help right away if you are not doing well or get worse.  Thank you for choosing an e-visit.  Your e-visit answers were reviewed by a board certified advanced clinical practitioner to complete your personal care plan. Depending upon the condition, your plan could have included both over the counter or prescription medications.  Please review your pharmacy choice. Make sure the pharmacy is open so you can pick up prescription now. If there is a problem, you may contact your provider through [employer] and have the prescription routed to another pharmacy.  Your safety is important to [place]. If you have drug allergies check your  prescription carefully.   For the next 24 hours you can use MyChart to ask questions about today's visit, request a non-urgent call back, or ask for a work or school excuse. You will get an email in the next two days asking about your experience. I hope that your e-visit has been valuable and will speed your recovery.

## 2022-08-18 NOTE — Progress Notes (Signed)
I have spent 5 minutes in review of e-visit questionnaire, review and updating patient chart, medical decision making and response to patient.   Ysidra Sopher Cody Caylen Kuwahara, PA-C    

## 2022-08-22 ENCOUNTER — Encounter: Payer: Self-pay | Admitting: Family Medicine

## 2022-08-22 ENCOUNTER — Ambulatory Visit: Payer: BC Managed Care – PPO | Admitting: Family Medicine

## 2022-08-22 ENCOUNTER — Other Ambulatory Visit (HOSPITAL_COMMUNITY): Payer: Self-pay

## 2022-08-22 VITALS — BP 120/76 | HR 78 | Temp 97.9°F | Resp 18 | Ht 71.0 in | Wt 234.4 lb

## 2022-08-22 DIAGNOSIS — F32A Depression, unspecified: Secondary | ICD-10-CM | POA: Diagnosis not present

## 2022-08-22 DIAGNOSIS — E559 Vitamin D deficiency, unspecified: Secondary | ICD-10-CM

## 2022-08-22 DIAGNOSIS — R41840 Attention and concentration deficit: Secondary | ICD-10-CM | POA: Diagnosis not present

## 2022-08-22 DIAGNOSIS — F419 Anxiety disorder, unspecified: Secondary | ICD-10-CM | POA: Diagnosis not present

## 2022-08-22 DIAGNOSIS — B20 Human immunodeficiency virus [HIV] disease: Secondary | ICD-10-CM | POA: Diagnosis not present

## 2022-08-22 DIAGNOSIS — Z7689 Persons encountering health services in other specified circumstances: Secondary | ICD-10-CM

## 2022-08-22 MED ORDER — ESCITALOPRAM OXALATE 10 MG PO TABS
10.0000 mg | ORAL_TABLET | Freq: Every day | ORAL | 1 refills | Status: DC
  Filled 2022-08-22: qty 30, 30d supply, fill #0
  Filled 2022-09-21: qty 30, 30d supply, fill #1

## 2022-08-22 NOTE — Progress Notes (Signed)
New Patient Office Visit  Subjective    Patient ID: Connor [name], male    DOB: Oct 27, 1985  Age: 37 y.o. MRN: [id number]  CC:  Chief Complaint  Patient presents with   Establish Care    New patient here to establish care with new PCP, Patient would like to have Vitamin D levelsn checked    HPI Connor [name] presents to establish care. Pt is new to me.  Pt is here to get Vitamin D levels checked. He reports he use to be on medicine for this. The last time he was on this was in 2020.  Pt would like to get tested for ADHD. He reports his mind is all over the place. Been present all his life but has worsened since his father passed in 2022 from alcoholism.  Flowsheet Row Office Visit from 08/22/2022 in [facility] Total Score 14      Pt reports a lot of times he feels depression. He use to be on Prozac but was unsuccessful. He is willing to try another medicine. He feels like along with his father's passing and HIV diagnosis 10 years ago, he's suffered from depression/anxiety. He worries about his mother's health after his father passing. He continues to follow up with ID for HIV treatment.   Outpatient Encounter Medications as of 08/22/2022  Medication Sig   amoxicillin-clavulanate (AUGMENTIN) 875-125 MG tablet Take 1 tablet by mouth 2 (two) times daily.   cabotegravir & rilpivirine ER (CABENUVA) 600 & 900 MG/3ML injection Inject 1 kit into the muscle every 2 (two) months.   montelukast (SINGULAIR) 10 MG tablet Take 1 tablet (10 mg total) by mouth at bedtime.   rosuvastatin (CRESTOR) 20 MG tablet Take 1 tablet (20 mg total) by mouth daily.   benzonatate (TESSALON) 100 MG capsule Take 1 capsule (100 mg total) by mouth 3 (three) times daily as needed for cough.   FLUoxetine (PROZAC) 10 MG capsule Take 1 capsule by mouth (take with 20mg  cap for a total of 30mg ) once a day 30 days   FLUoxetine (PROZAC) 20 MG capsule Take 1 capsule by mouth (take  with 10mg  cap for a total of 30mg ) once a day 30 days   fluticasone (FLONASE) 50 MCG/ACT nasal spray Place 2 sprays into both nostrils daily. (Patient not taking: Reported on 12/07/2020)   omeprazole (PRILOSEC) 40 MG capsule Take 1 capsule (40 mg total) by mouth daily.   trimethoprim-polymyxin b (POLYTRIM) ophthalmic solution Place 1-2 drops into the left eye every 4 (four) hours.   No facility-administered encounter medications on file as of 08/22/2022.    Past Medical History:  Diagnosis Date   Anxiety    Cough 01/18/2021   Depression    History of syphilis    HIV infection (HCC)    Routine screening for STI (sexually transmitted infection) 01/03/2022   Sore throat 01/18/2021   Ulcerative colitis (HCC)    Upper respiratory infection, acute 01/18/2021   Vitamin D deficiency     History reviewed. No pertinent surgical history.  Family History  Problem Relation Age of Onset   Hypertension Mother    Alcohol abuse Father    Hypertension Father    Diabetes Maternal Grandmother    Stroke Paternal Grandfather    Cancer Neg Hx     Social History   Socioeconomic History   Marital status: Single    Spouse name: Not on file   Number of children: Not  on file   Years of education: Not on file   Highest education level: Not on file  Occupational History   Not on file  Tobacco Use   Smoking status: Never    Passive exposure: Never   Smokeless tobacco: Never  Vaping Use   Vaping Use: Never used  Substance and Sexual Activity   Alcohol use: No    Alcohol/week: 0.0 standard drinks of alcohol   Drug use: No   Sexual activity: Yes    Partners: Female, Male    Birth control/protection: Condom    Comment: refused condoms  Other Topics Concern   Not on file  Social History Narrative   Not on file   Social Determinants of Health   Financial Resource Strain: Not on file  Food Insecurity: Not on file  Transportation Needs: Not on file  Physical Activity: Not on file  Stress: Not  on file  Social Connections: Not on file  Intimate Partner Violence: Not on file    Review of Systems  Psychiatric/Behavioral:  Positive for depression.        Inattention  All other systems reviewed and are negative.      Objective    BP 120/76   Pulse 78   Temp 97.9 F (36.6 C) (Oral)   Resp 18   Ht 5\' 11"  (1.803 m)   Wt 234 lb 6.4 oz (106.3 kg)   SpO2 99%   BMI 32.69 kg/m   Physical Exam Vitals and nursing note reviewed.  Constitutional:      Appearance: Normal appearance. He is normal weight.  HENT:     Head: Normocephalic and atraumatic.     Right Ear: External ear normal.     Left Ear: External ear normal.     Nose: Nose normal.     Mouth/Throat:     Mouth: Mucous membranes are moist.  Eyes:     Conjunctiva/sclera: Conjunctivae normal.     Pupils: Pupils are equal, round, and reactive to light.  Cardiovascular:     Rate and Rhythm: Normal rate and regular rhythm.     Pulses: Normal pulses.     Heart sounds: Normal heart sounds.  Pulmonary:     Effort: Pulmonary effort is normal.     Breath sounds: Normal breath sounds.  Skin:    Capillary Refill: Capillary refill takes less than 2 seconds.  Neurological:     General: No focal deficit present.     Mental Status: He is alert and oriented to person, place, and time. Mental status is at baseline.  Psychiatric:        Mood and Affect: Mood normal.        Behavior: Behavior normal.        Thought Content: Thought content normal.        Judgment: Judgment normal.       Assessment & Plan:   Problem List Items Addressed This Visit   None Encounter to establish care with new doctor  Anxiety and depression -     Escitalopram Oxalate; Take 1 tablet (10 mg total) by mouth daily.  Dispense: 30 tablet; Refill: 1  Vitamin D deficiency -     VITAMIN D 25 Hydroxy (Vit-D Deficiency, Fractures)  Human immunodeficiency virus (HIV) disease (HCC)  Inattention -     Ambulatory referral to Psychology   Pt is  willing to try another medicine for depression and anxiety. Will start trial of Lexapro 10mg  daily. See back in 6-8 weeks for  annual and follow up. Pt requesting Vitamin D levels to be checked specifically today. This was done. Refer to ADHD clinic for evaluation and treatment options.  Continue follow up with Infectious disease clinic for HIV treatment.  No follow-ups on file.   Suzan Slick, MD

## 2022-08-23 ENCOUNTER — Other Ambulatory Visit: Payer: Self-pay

## 2022-08-23 LAB — VITAMIN D 25 HYDROXY (VIT D DEFICIENCY, FRACTURES): Vit D, 25-Hydroxy: 40.5 ng/mL (ref 30.0–100.0)

## 2022-09-05 ENCOUNTER — Other Ambulatory Visit (HOSPITAL_COMMUNITY)
Admission: RE | Admit: 2022-09-05 | Discharge: 2022-09-05 | Disposition: A | Discharge status: Home / Self Care | Payer: BC Managed Care – PPO | Source: Ambulatory Visit | Attending: Infectious Disease | Admitting: Infectious Disease

## 2022-09-05 ENCOUNTER — Ambulatory Visit (INDEPENDENT_AMBULATORY_CARE_PROVIDER_SITE_OTHER): Payer: BC Managed Care – PPO | Admitting: Infectious Disease

## 2022-09-05 ENCOUNTER — Other Ambulatory Visit (HOSPITAL_COMMUNITY): Payer: Self-pay

## 2022-09-05 ENCOUNTER — Encounter: Payer: Self-pay | Admitting: Infectious Disease

## 2022-09-05 ENCOUNTER — Other Ambulatory Visit: Payer: Self-pay

## 2022-09-05 VITALS — BP 131/82 | HR 66 | Temp 97.8°F | Wt 236.0 lb

## 2022-09-05 DIAGNOSIS — B20 Human immunodeficiency virus [HIV] disease: Secondary | ICD-10-CM

## 2022-09-05 DIAGNOSIS — Z113 Encounter for screening for infections with a predominantly sexual mode of transmission: Secondary | ICD-10-CM

## 2022-09-05 DIAGNOSIS — Z129 Encounter for screening for malignant neoplasm, site unspecified: Secondary | ICD-10-CM

## 2022-09-05 MED ORDER — CABOTEGRAVIR & RILPIVIRINE ER 600 & 900 MG/3ML IM SUER
1.0000 | Freq: Once | INTRAMUSCULAR | Status: AC
  Administered 2022-09-05: 1 via INTRAMUSCULAR

## 2022-09-05 MED ORDER — DOXYCYCLINE HYCLATE 100 MG PO TABS
200.0000 mg | ORAL_TABLET | ORAL | 5 refills | Status: DC
  Filled 2022-09-05: qty 60, 30d supply, fill #0
  Filled 2022-11-05: qty 60, 30d supply, fill #1
  Filled 2023-03-05: qty 60, 30d supply, fill #2
  Filled 2023-06-14: qty 60, 30d supply, fill #3
  Filled 2023-08-10: qty 60, 30d supply, fill #4

## 2022-09-05 NOTE — Progress Notes (Signed)
Subjective:  Chief complaint: Follow-up for HIV disease on Cabenuva   Patient ID: Connor [name], male    DOB: 1986-03-24, 37 y.o.   MRN: [id number]  HPI Connor [name] is a 37 year old black man with HIV that has been perfectly controlled most recently on every 2 monthly Cabenuva with target date #24.  He had some injection site reaction initially they did not really bother him very much.  He is tolerating Cabenuva well and suppressed.       Past Medical History:  Diagnosis Date   Anxiety    Cough 01/18/2021   Depression    History of syphilis    HIV infection (HCC)    Routine screening for STI (sexually transmitted infection) 01/03/2022   Sore throat 01/18/2021   Ulcerative colitis (HCC)    Upper respiratory infection, acute 01/18/2021   Vitamin D deficiency     No past surgical history on file.  Family History  Problem Relation Age of Onset   Hypertension Mother    Alcohol abuse Father    Hypertension Father    Diabetes Maternal Grandmother    Stroke Paternal Grandfather    Cancer Neg Hx       Social History   Socioeconomic History   Marital status: Single    Spouse name: Not on file   Number of children: Not on file   Years of education: Not on file   Highest education level: Not on file  Occupational History   Not on file  Tobacco Use   Smoking status: Never    Passive exposure: Never   Smokeless tobacco: Never  Vaping Use   Vaping Use: Never used  Substance and Sexual Activity   Alcohol use: No    Alcohol/week: 0.0 standard drinks of alcohol   Drug use: No   Sexual activity: Yes    Partners: Female, Male    Birth control/protection: Condom    Comment: refused condoms  Other Topics Concern   Not on file  Social History Narrative   Not on file   Social Determinants of Health   Financial Resource Strain: Not on file  Food Insecurity: Not on file  Transportation Needs: Not on file  Physical Activity: Not on file  Stress: Not on file  Social  Connections: Not on file    No Known Allergies   Current Outpatient Medications:    amoxicillin-clavulanate (AUGMENTIN) 875-125 MG tablet, Take 1 tablet by mouth 2 (two) times daily., Disp: 14 tablet, Rfl: 0   cabotegravir & rilpivirine ER (CABENUVA) 600 & 900 MG/3ML injection, Inject 1 kit into the muscle every 2 (two) months., Disp: 6 mL, Rfl: 5   escitalopram (LEXAPRO) 10 MG tablet, Take 1 tablet (10 mg total) by mouth daily., Disp: 30 tablet, Rfl: 1   montelukast (SINGULAIR) 10 MG tablet, Take 1 tablet (10 mg total) by mouth at bedtime., Disp: 30 tablet, Rfl: 2   rosuvastatin (CRESTOR) 20 MG tablet, Take 1 tablet (20 mg total) by mouth daily., Disp: 30 tablet, Rfl: 11   Review of Systems  Constitutional:  Negative for chills and fever.  HENT:  Negative for congestion and sore throat.   Eyes:  Negative for photophobia.  Respiratory:  Negative for cough, shortness of breath and wheezing.   Cardiovascular:  Negative for chest pain, palpitations and leg swelling.  Gastrointestinal:  Negative for abdominal pain, blood in stool, constipation, diarrhea, nausea and vomiting.  Genitourinary:  Negative for dysuria, flank pain and hematuria.  Musculoskeletal:  Negative  for back pain and myalgias.  Skin:  Negative for rash.  Neurological:  Negative for dizziness, weakness and headaches.  Hematological:  Does not bruise/bleed easily.  Psychiatric/Behavioral:  Negative for suicidal ideas.        Objective:   Physical Exam Exam conducted with a chaperone present.  Constitutional:      General: He is not in acute distress.    Appearance: He is not diaphoretic.  HENT:     Head: Normocephalic and atraumatic.     Right Ear: External ear normal.     Left Ear: External ear normal.     Nose: Nose normal.     Mouth/Throat:     Pharynx: No oropharyngeal exudate.  Eyes:     General: No scleral icterus.       Right eye: No discharge.        Left eye: No discharge.     Extraocular Movements:  Extraocular movements intact.     Conjunctiva/sclera: Conjunctivae normal.  Cardiovascular:     Rate and Rhythm: Normal rate and regular rhythm.     Heart sounds:     No friction rub.  Pulmonary:     Effort: Pulmonary effort is normal. No respiratory distress.     Breath sounds: No wheezing or rales.  Abdominal:     General: There is no distension.     Palpations: Abdomen is soft.     Tenderness: There is no rebound.  Genitourinary:    Rectum: External hemorrhoid present.     Comments: Anal swab for GC and chlamydia and anal pap smear performed Musculoskeletal:        General: No tenderness. Normal range of motion.     Cervical back: Normal range of motion and neck supple.  Lymphadenopathy:     Cervical: No cervical adenopathy.  Skin:    General: Skin is warm and dry.     Coloration: Skin is not jaundiced or pale.     Findings: No erythema, lesion or rash.  Neurological:     General: No focal deficit present.     Mental Status: He is alert and oriented to person, place, and time.     Coordination: Coordination normal.  Psychiatric:        Mood and Affect: Mood normal.        Behavior: Behavior normal.        Thought Content: Thought content normal.        Judgment: Judgment normal.           Assessment & Plan:  HIV disease:  I will add order HIV viral load CD4 count CBC with differential CMP, RPR GC and chlamydia and we will give him his Q2M Cabenuva injection  STI will screen for syphilis as well as gonorrhea chlamydia and oropharynx urine and rectum.  Have prescribed doxycycline to take as postexposure prophylaxis 2 tablets after unprotected sex  Anal cancer prevention have performed anal Pap smear.

## 2022-09-06 LAB — URINE CYTOLOGY ANCILLARY ONLY
Chlamydia: NEGATIVE
Comment: NEGATIVE
Comment: NORMAL
Neisseria Gonorrhea: NEGATIVE

## 2022-09-06 LAB — CYTOLOGY, (ORAL, ANAL, URETHRAL) ANCILLARY ONLY
Chlamydia: NEGATIVE
Chlamydia: NEGATIVE
Comment: NEGATIVE
Comment: NEGATIVE
Comment: NORMAL
Comment: NORMAL
Neisseria Gonorrhea: NEGATIVE
Neisseria Gonorrhea: NEGATIVE

## 2022-09-06 LAB — RPR TITER: RPR Titer: 1:2 {titer} — ABNORMAL HIGH

## 2022-09-06 NOTE — Addendum Note (Signed)
Addended by: Harley Alto on: 09/06/2022 03:49 PM   Modules accepted: Orders

## 2022-09-07 ENCOUNTER — Other Ambulatory Visit: Payer: BC Managed Care – PPO

## 2022-09-07 ENCOUNTER — Other Ambulatory Visit: Payer: Self-pay

## 2022-09-07 DIAGNOSIS — B20 Human immunodeficiency virus [HIV] disease: Secondary | ICD-10-CM

## 2022-09-07 LAB — T PALLIDUM AB: T Pallidum Abs: POSITIVE — AB

## 2022-09-07 NOTE — Progress Notes (Deleted)
Patient ID: Connor [name], male   DOB: Oct 27, 1985, 37 y.o.   MRN: [id number]

## 2022-09-08 LAB — LIPID PANEL
Cholesterol: 151 mg/dL (ref <200)
HDL: 49 mg/dL (ref >=40)
LDL Cholesterol (Calc): 87 mg/dL (calc)
Non-HDL Cholesterol (Calc): 102 mg/dL (calc) (ref <130)
Total CHOL/HDL Ratio: 3.1 (calc) (ref <5.0)
Triglycerides: 66 mg/dL (ref <150)

## 2022-09-08 LAB — CBC WITH DIFFERENTIAL/PLATELET
Absolute Monocytes: 614 cells/uL (ref 200–950)
Basophils Absolute: 48 cells/uL (ref 0–200)
Basophils Relative: 0.5 %
Eosinophils Absolute: 432 cells/uL (ref 15–500)
Eosinophils Relative: 4.5 %
HCT: 37.4 % — ABNORMAL LOW (ref 38.5–50.0)
Hemoglobin: 12.5 g/dL — ABNORMAL LOW (ref 13.2–17.1)
Lymphs Abs: 3168 cells/uL (ref 850–3900)
MCH: 28.7 pg (ref 27.0–33.0)
MCHC: 33.4 g/dL (ref 32.0–36.0)
MCV: 86 fL (ref 80.0–100.0)
MPV: 11.7 fL (ref 7.5–12.5)
Monocytes Relative: 6.4 %
Neutro Abs: 5338 cells/uL (ref 1500–7800)
Neutrophils Relative %: 55.6 %
Platelets: 204 10*3/uL (ref 140–400)
RBC: 4.35 10*6/uL (ref 4.20–5.80)
RDW: 13 % (ref 11.0–15.0)
Total Lymphocyte: 33 %
WBC: 9.6 10*3/uL (ref 3.8–10.8)

## 2022-09-08 LAB — COMPLETE METABOLIC PANEL WITH GFR
AG Ratio: 1.5 (calc) (ref 1.0–2.5)
ALT: 9 U/L (ref 9–46)
AST: 14 U/L (ref 10–40)
Albumin: 4.3 g/dL (ref 3.6–5.1)
Alkaline phosphatase (APISO): 86 U/L (ref 36–130)
BUN: 12 mg/dL (ref 7–25)
CO2: 29 mmol/L (ref 20–32)
Calcium: 9.7 mg/dL (ref 8.6–10.3)
Chloride: 105 mmol/L (ref 98–110)
Creat: 1 mg/dL (ref 0.60–1.26)
Globulin: 2.8 g/dL (calc) (ref 1.9–3.7)
Glucose, Bld: 96 mg/dL (ref 65–99)
Potassium: 4.1 mmol/L (ref 3.5–5.3)
Sodium: 142 mmol/L (ref 135–146)
Total Bilirubin: 0.4 mg/dL (ref 0.2–1.2)
Total Protein: 7.1 g/dL (ref 6.1–8.1)
eGFR: 98 mL/min/{1.73_m2} (ref >=60)

## 2022-09-08 LAB — HIV RNA, RTPCR W/R GT (RTI, PI,INT)
HIV 1 RNA Quant: <20 copies/mL — AB
HIV-1 RNA Quant, Log: <1.3 Log copies/mL — AB

## 2022-09-08 LAB — RPR: RPR Ser Ql: REACTIVE — AB

## 2022-09-09 ENCOUNTER — Other Ambulatory Visit: Payer: BC Managed Care – PPO

## 2022-09-09 ENCOUNTER — Other Ambulatory Visit: Payer: Self-pay

## 2022-09-09 DIAGNOSIS — B20 Human immunodeficiency virus [HIV] disease: Secondary | ICD-10-CM

## 2022-09-13 ENCOUNTER — Telehealth: Payer: Self-pay

## 2022-09-13 LAB — CYTOLOGY - PAP: Diagnosis: NEGATIVE

## 2022-09-13 LAB — T-HELPER CELLS (CD4) COUNT (NOT AT ARMC)
Absolute CD4: 834 cells/uL (ref 490–1740)
CD4 T Helper %: 25 % — ABNORMAL LOW (ref 30–61)
Total lymphocyte count: 3359 cells/uL (ref 850–3900)

## 2022-09-13 NOTE — Telephone Encounter (Signed)
Attempted to call patient regarding lab results. Not able to reach him at this time. Will send mychart message. Sewell Pitner M Ewald Beg, RMA  

## 2022-09-13 NOTE — Telephone Encounter (Signed)
-----   Message from Cornelius N Van Dam, MD sent at 09/13/2022  8:42 AM EDT ----- Anal pap is negative (good news) ----- Message ----- From: Interface, [facility] Lab Results In Sent: 09/05/2022  11:17 PM EDT To: Cornelius N Van Dam, MD   

## 2022-09-13 NOTE — Telephone Encounter (Signed)
-----   Message from [name] Hiss, MD sent at 09/13/2022  8:42 AM EDT ----- Anal pap is negative (good news) ----- Message ----- From: Janace Hoard Lab Results In Sent: 09/05/2022  11:17 PM EDT To: [name] Hiss, MD

## 2022-09-22 ENCOUNTER — Other Ambulatory Visit (HOSPITAL_COMMUNITY): Payer: Self-pay

## 2022-10-03 ENCOUNTER — Ambulatory Visit (INDEPENDENT_AMBULATORY_CARE_PROVIDER_SITE_OTHER): Payer: BC Managed Care – PPO | Admitting: Family Medicine

## 2022-10-03 ENCOUNTER — Encounter: Payer: Self-pay | Admitting: Family Medicine

## 2022-10-03 ENCOUNTER — Other Ambulatory Visit (HOSPITAL_COMMUNITY): Payer: Self-pay

## 2022-10-03 ENCOUNTER — Other Ambulatory Visit: Payer: Self-pay

## 2022-10-03 VITALS — BP 130/78 | HR 73 | Temp 97.8°F | Resp 18 | Ht 71.0 in | Wt 234.0 lb

## 2022-10-03 DIAGNOSIS — F32A Depression, unspecified: Secondary | ICD-10-CM | POA: Diagnosis not present

## 2022-10-03 DIAGNOSIS — F419 Anxiety disorder, unspecified: Secondary | ICD-10-CM | POA: Diagnosis not present

## 2022-10-03 DIAGNOSIS — Z0000 Encounter for general adult medical examination without abnormal findings: Secondary | ICD-10-CM

## 2022-10-03 DIAGNOSIS — Z0184 Encounter for antibody response examination: Secondary | ICD-10-CM

## 2022-10-03 DIAGNOSIS — Z111 Encounter for screening for respiratory tuberculosis: Secondary | ICD-10-CM

## 2022-10-03 DIAGNOSIS — R7302 Impaired glucose tolerance (oral): Secondary | ICD-10-CM

## 2022-10-03 MED ORDER — ESCITALOPRAM OXALATE 20 MG PO TABS
20.0000 mg | ORAL_TABLET | Freq: Every day | ORAL | 1 refills | Status: DC
Start: 2022-10-03 — End: 2023-03-05
  Filled 2022-10-03: qty 30, 30d supply, fill #0
  Filled 2022-11-05: qty 30, 30d supply, fill #1

## 2022-10-03 NOTE — Progress Notes (Signed)
Complete physical exam  Patient: Connor [name]   DOB: 1985/05/12   37 y.o. Male  MRN: [id number]  Subjective:    Chief Complaint  Patient presents with   Annual Exam    Patient is here for his annual physical    Connor [name] is a 37 y.o. male who presents today for a complete physical exam. He reports consuming a general diet. The patient does not participate in regular exercise at present. He generally feels well. He reports sleeping well. He does have additional problems to discuss today.   Pt is here with physical form he needs completed for school. Most recent fall risk assessment:    08/22/2022    3:45 PM  Fall Risk   Falls in the past year? 0  Number falls in past yr: 0  Injury with Fall? 0  Risk for fall due to : No Fall Risks  Follow up Falls evaluation completed     Most recent depression screenings:    10/03/2022    8:46 AM 08/22/2022    3:47 PM  PHQ 2/9 Scores  PHQ - 2 Score 2 3  PHQ- 9 Score 18 14    Vision:Within last year  Patient Active Problem List   Diagnosis Date Noted   Routine screening for STI (sexually transmitted infection) 01/03/2022   Upper respiratory infection, acute 01/18/2021   Sore throat 01/18/2021   Cough 01/18/2021   Tinea corporis 03/05/2020   Prediabetes 09/13/2018   GERD (gastroesophageal reflux disease) 09/13/2018   Obesity (BMI 30-39.9) 06/01/2018   Vitamin D deficiency 05/16/2016   Anxiety and depression 05/16/2016   Insomnia 06/28/2011   Human immunodeficiency virus (HIV) disease (HCC) 07/13/2009   Past Medical History:  Diagnosis Date   Anxiety    Cough 01/18/2021   Depression    History of syphilis    HIV infection (HCC)    Routine screening for STI (sexually transmitted infection) 01/03/2022   Sore throat 01/18/2021   Ulcerative colitis (HCC)    Upper respiratory infection, acute 01/18/2021   Vitamin D deficiency    History reviewed. No pertinent surgical history. Social History   Tobacco Use    Smoking status: Never    Passive exposure: Never   Smokeless tobacco: Never  Vaping Use   Vaping Use: Never used  Substance Use Topics   Alcohol use: No    Alcohol/week: 0.0 standard drinks of alcohol   Drug use: No   Family Status  Relation Name Status   Mother  Alive   Father  Alive   MGM  (Not Specified)   PGF  (Not Specified)   Neg Hx  (Not Specified)   Family History  Problem Relation Age of Onset   Hypertension Mother    Alcohol abuse Father    Hypertension Father    Diabetes Maternal Grandmother    Stroke Paternal Grandfather    Cancer Neg Hx    No Known Allergies    Patient Care Team: Suzan Slick, MD as PCP - General (Family Medicine) Ginnie Smart, MD as PCP - Infectious Diseases (Infectious Diseases)   Outpatient Medications Prior to Visit  Medication Sig   cabotegravir & rilpivirine ER (CABENUVA) 600 & 900 MG/3ML injection Inject 1 kit into the muscle every 2 (two) months.   doxycycline (VIBRA-TABS) 100 MG tablet Take 2 tablets (200 mg total) by mouth after sex to prevent STIs.   montelukast (SINGULAIR) 10 MG tablet Take 1 tablet (10 mg total) by  mouth at bedtime.   rosuvastatin (CRESTOR) 20 MG tablet Take 1 tablet (20 mg total) by mouth daily.   [DISCONTINUED] escitalopram (LEXAPRO) 10 MG tablet Take 1 tablet (10 mg total) by mouth daily.   No facility-administered medications prior to visit.    Review of Systems  Psychiatric/Behavioral:  Positive for depression. The patient is nervous/anxious.   All other systems reviewed and are negative.         Objective:     BP 130/78   Pulse 73   Temp 97.8 F (36.6 C) (Oral)   Resp 18   Ht 5\' 11"  (1.803 m)   Wt 234 lb (106.1 kg)   SpO2 98%   BMI 32.64 kg/m  BP Readings from Last 3 Encounters:  10/03/22 130/78  09/05/22 131/82  08/22/22 120/76      Physical Exam Vitals and nursing note reviewed.  Constitutional:      Appearance: Normal appearance. He is normal weight.  HENT:      Head: Normocephalic and atraumatic.     Right Ear: Tympanic membrane, ear canal and external ear normal.     Left Ear: Tympanic membrane, ear canal and external ear normal.     Nose: Nose normal.     Mouth/Throat:     Mouth: Mucous membranes are moist.     Pharynx: Oropharynx is clear.  Eyes:     Conjunctiva/sclera: Conjunctivae normal.     Pupils: Pupils are equal, round, and reactive to light.  Cardiovascular:     Rate and Rhythm: Normal rate and regular rhythm.     Pulses: Normal pulses.     Heart sounds: Normal heart sounds.  Pulmonary:     Effort: Pulmonary effort is normal.     Breath sounds: Normal breath sounds.  Abdominal:     General: Abdomen is flat. Bowel sounds are normal.  Skin:    General: Skin is warm.     Capillary Refill: Capillary refill takes less than 2 seconds.  Neurological:     General: No focal deficit present.     Mental Status: He is alert and oriented to person, place, and time. Mental status is at baseline.  Psychiatric:        Mood and Affect: Mood normal.        Behavior: Behavior normal.        Thought Content: Thought content normal.        Judgment: Judgment normal.      No results found for any visits on 10/03/22. Last CBC Lab Results  Component Value Date   WBC 9.6 09/05/2022   HGB 12.5 (L) 09/05/2022   HCT 37.4 (L) 09/05/2022   MCV 86.0 09/05/2022   MCH 28.7 09/05/2022   RDW 13.0 09/05/2022   PLT 204 09/05/2022   Last metabolic panel Lab Results  Component Value Date   GLUCOSE 96 09/05/2022   NA 142 09/05/2022   K 4.1 09/05/2022   CL 105 09/05/2022   CO2 29 09/05/2022   BUN 12 09/05/2022   CREATININE 1.00 09/05/2022   EGFR 98 09/05/2022   CALCIUM 9.7 09/05/2022   PROT 7.1 09/05/2022   ALBUMIN 3.8 05/20/2022   BILITOT 0.4 09/05/2022   ALKPHOS 71 05/20/2022   AST 14 09/05/2022   ALT 9 09/05/2022   ANIONGAP 6 05/20/2022   Last lipids Lab Results  Component Value Date   CHOL 151 09/05/2022   HDL 49 09/05/2022    LDLCALC 87 09/05/2022   TRIG 66 09/05/2022  CHOLHDL 3.1 09/05/2022        Assessment & Plan:    Routine Health Maintenance and Physical Exam  Immunization History  Administered Date(s) Administered   HPV 9-valent 07/14/2021, 09/06/2021, 01/03/2022   Hepatitis B 03/15/2010   Hepb-cpg 05/10/2021, 07/14/2021   Influenza Split 03/02/2012   Influenza, Seasonal, Injecte, Preservative Fre 01/25/2013, 01/14/2019, 02/03/2020   Influenza,inj,Quad PF,6+ Mos 12/19/2014, 12/27/2015, 12/20/2017, 01/14/2019, 02/03/2020, 01/11/2021   Influenza-Unspecified 02/08/2017, 01/16/2018, 01/17/2019, 01/16/2022   Meningococcal Mcv4o 07/19/2016, 09/06/2021   PFIZER(Purple Top)SARS-COV-2 Vaccination 04/13/2019, 05/02/2019   Pfizer Covid-19 Vaccine Bivalent Booster 22yrs & up 12/03/2019   Pneumococcal Conjugate-13 12/04/2017   Pneumococcal Polysaccharide-23 09/02/2009, 05/16/2016, 05/17/2019   Tdap 05/05/2015   Vaccinia,smallpox Monkeypox Vaccine Live,pf 01/11/2021, 03/07/2022    Health Maintenance  Topic Date Due   COVID-19 Vaccine (4 - 2023-24 season) 12/17/2021   INFLUENZA VACCINE  11/17/2022   DTaP/Tdap/Td (2 - Td or Tdap) 05/04/2025   HPV VACCINES  Completed   Hepatitis C Screening  Completed   HIV Screening  Completed    Discussed health benefits of physical activity, and encouraged him to engage in regular exercise appropriate for his age and condition.  Problem List Items Addressed This Visit       Other   Anxiety and depression   Relevant Medications   escitalopram (LEXAPRO) 20 MG tablet   Other Visit Diagnoses     Annual physical exam    -  Primary   Impaired glucose tolerance       Relevant Orders   Hemoglobin A1c   Screening-pulmonary TB       Relevant Orders   QuantiFERON-TB Gold Plus   Immunity status testing       Relevant Orders   QuantiFERON-TB Gold Plus   Measles/Mumps/Rubella Immunity   Varicella Zoster Abs, IgG/IgM   Hepatitis B Surface AntiBODY      Return  in about 3 months (around 01/03/2023) for Chronic condition follow up. Screening diabetes screen. He's had CBC, CMP and lipid through ID provider for HIV surveillance. These labs were reviewed. Titers drawn for TB, Hep B, MMR, and varicella. Advised him there's no record of Dtap and Polio. To obtain these from his records Increase Lexapro from 10mg  to 20mg . To follow up in 3 months.   Flowsheet Row Office Visit from 10/03/2022 in [facility]  PHQ-9 Total Score 18          Suzan Slick, MD

## 2022-10-03 NOTE — Patient Instructions (Signed)
Need documentation on polio and Dtap vaccines

## 2022-10-07 LAB — QUANTIFERON-TB GOLD PLUS
QuantiFERON Mitogen Value: >10 IU/mL
QuantiFERON Nil Value: 0.46 IU/mL
QuantiFERON TB1 Ag Value: 0.33 IU/mL
QuantiFERON TB2 Ag Value: 0.26 IU/mL
QuantiFERON-TB Gold Plus: NEGATIVE

## 2022-10-07 LAB — MEASLES/MUMPS/RUBELLA IMMUNITY
MUMPS ABS, IGG: 10.1 AU/mL — ABNORMAL LOW (ref >10.9)
RUBEOLA AB, IGG: 141 AU/mL (ref >16.4)
Rubella Antibodies, IGG: 1.02 index (ref >0.99)

## 2022-10-07 LAB — HEPATITIS B SURFACE ANTIBODY,QUALITATIVE: Hep B Surface Ab, Qual: REACTIVE

## 2022-10-07 LAB — HEMOGLOBIN A1C
Est. average glucose Bld gHb Est-mCnc: 131 mg/dL
Hgb A1c MFr Bld: 6.2 % — ABNORMAL HIGH (ref 4.8–5.6)

## 2022-10-07 LAB — VARICELLA ZOSTER ABS, IGG/IGM
Varicella IgM: <0.91 index (ref 0.00–0.90)
Varicella zoster IgG: 1469 index (ref >165)

## 2022-10-10 ENCOUNTER — Encounter: Payer: Self-pay | Admitting: Family Medicine

## 2022-10-11 ENCOUNTER — Ambulatory Visit (INDEPENDENT_AMBULATORY_CARE_PROVIDER_SITE_OTHER): Payer: BC Managed Care – PPO | Admitting: Family Medicine

## 2022-10-11 VITALS — BP 108/69 | HR 79 | Temp 99.0°F | Resp 20 | Ht 71.0 in | Wt 224.9 lb

## 2022-10-11 DIAGNOSIS — Z23 Encounter for immunization: Secondary | ICD-10-CM

## 2022-11-05 ENCOUNTER — Other Ambulatory Visit (HOSPITAL_COMMUNITY): Payer: Self-pay

## 2022-11-07 ENCOUNTER — Other Ambulatory Visit: Payer: Self-pay

## 2022-11-07 ENCOUNTER — Other Ambulatory Visit (HOSPITAL_COMMUNITY)
Admission: RE | Admit: 2022-11-07 | Discharge: 2022-11-07 | Disposition: A | Discharge status: Home / Self Care | Payer: BC Managed Care – PPO | Source: Ambulatory Visit | Attending: Infectious Disease | Admitting: Infectious Disease

## 2022-11-07 ENCOUNTER — Ambulatory Visit: Payer: BC Managed Care – PPO | Admitting: Pharmacist

## 2022-11-07 DIAGNOSIS — Z113 Encounter for screening for infections with a predominantly sexual mode of transmission: Secondary | ICD-10-CM | POA: Insufficient documentation

## 2022-11-07 DIAGNOSIS — B20 Human immunodeficiency virus [HIV] disease: Secondary | ICD-10-CM | POA: Diagnosis not present

## 2022-11-07 MED ORDER — CABOTEGRAVIR & RILPIVIRINE ER 600 & 900 MG/3ML IM SUER
1.0000 | Freq: Once | INTRAMUSCULAR | Status: AC
Start: 2022-11-07 — End: 2022-11-07
  Administered 2022-11-07: 1 via INTRAMUSCULAR

## 2022-11-07 NOTE — Progress Notes (Signed)
HPI: Connor [name] is a 37 y.o. male who presents to the [facility] pharmacy clinic for [place] administration.  Patient Active Problem List   Diagnosis Date Noted   Routine screening for STI (sexually transmitted infection) 01/03/2022   Upper respiratory infection, acute 01/18/2021   Sore throat 01/18/2021   Cough 01/18/2021   Tinea corporis 03/05/2020   Prediabetes 09/13/2018   GERD (gastroesophageal reflux disease) 09/13/2018   Obesity (BMI 30-39.9) 06/01/2018   Vitamin D deficiency 05/16/2016   Anxiety and depression 05/16/2016   Insomnia 06/28/2011   Human immunodeficiency virus (HIV) disease (HCC) 07/13/2009    Patient's Medications  New Prescriptions   No medications on file  Previous Medications   CABOTEGRAVIR & RILPIVIRINE ER (CABENUVA) 600 & 900 MG/3ML INJECTION    Inject 1 kit into the muscle every 2 (two) months.   DOXYCYCLINE (VIBRA-TABS) 100 MG TABLET    Take 2 tablets (200 mg total) by mouth after sex to prevent STIs.   ESCITALOPRAM (LEXAPRO) 20 MG TABLET    Take 1 tablet (20 mg total) by mouth daily.   MONTELUKAST (SINGULAIR) 10 MG TABLET    Take 1 tablet (10 mg total) by mouth at bedtime.   ROSUVASTATIN (CRESTOR) 20 MG TABLET    Take 1 tablet (20 mg total) by mouth daily.  Modified Medications   No medications on file  Discontinued Medications   No medications on file    Allergies: No Known Allergies  Labs: Lab Results  Component Value Date   HIV1RNAQUANT <20 DETECTED (A) 09/05/2022   HIV1RNAQUANT <20 05/20/2022   HIV1RNAQUANT Not Detected 01/03/2022   CD4TABS 526 05/20/2022   CD4TABS 567 01/03/2022   CD4TABS 713 01/11/2021    RPR and STI Lab Results  Component Value Date   LABRPR REACTIVE (A) 09/05/2022   LABRPR REACTIVE (A) 07/11/2022   LABRPR REACTIVE (A) 01/03/2022   LABRPR REACTIVE (A) 09/06/2021   LABRPR REACTIVE (A) 05/10/2021   RPRTITER 1:2 (H) 09/05/2022   RPRTITER 1:2 (H) 07/11/2022   RPRTITER 1:4 (H) 01/03/2022   RPRTITER 1:4  (H) 09/06/2021   RPRTITER 1:4 (H) 05/10/2021    STI Results GC CT  09/05/2022  2:16 PM Negative    Negative    Negative  Negative    Negative    Negative   08/08/2022  9:18 AM Negative  Negative   07/11/2022  9:40 AM Negative    Positive    Negative  Negative    Negative    Negative   01/03/2022  9:06 AM Negative    Negative    Negative  Negative    Negative    Negative   09/06/2021  9:14 AM Negative  Negative   03/08/2021  9:16 AM Negative  Negative   11/16/2020 10:53 AM Negative  Negative   05/07/2019  3:08 PM Negative  Negative   11/20/2017 12:00 AM Negative  Negative   07/19/2016 12:00 AM Negative  Negative   09/28/2015 12:00 AM Negative  Negative     Hepatitis B Lab Results  Component Value Date   HEPBSAB REACTIVE (A) 09/06/2021   HEPBSAG NEG 07/13/2009   HEPBCAB NEG 07/13/2009   Hepatitis C No results found for: "HEPCAB", "HCVRNAPCRQN" Hepatitis A Lab Results  Component Value Date   HAV POS (A) 07/13/2009   Lipids: Lab Results  Component Value Date   CHOL 151 09/05/2022   TRIG 66 09/05/2022   HDL 49 09/05/2022   CHOLHDL 3.1 09/05/2022   VLDL 28.4 07/27/2017  LDLCALC 87 09/05/2022    TARGET DATE: The 24th  Assessment: Connor [name] presents today for his maintenance Cabenuva injections. Past injections were tolerated well without issues. Last HIV RNA was undetectable in May. Requesting full STI testing today. No symptoms; just being cautious.   Administered cabotegravir 600mg /87mL in left upper outer quadrant of the gluteal muscle. Administered rilpivirine 900 mg/35mL in the right upper outer quadrant of the gluteal muscle. No issues with injections. He will follow up in 2 months for next set of injections.  Plan: - Cabenuva injections administered - HIV RNA, RPR, and urine/rectal/pharyngeal cytologies for GC/chlamydia  - Next injections scheduled for 01/09/23 with me - Call with any issues or questions  Trenell Concannon L. Tyberius Ryner, PharmD, BCIDP, AAHIVP,  CPP Clinical Pharmacist Practitioner Infectious Diseases Clinical Pharmacist [facility]

## 2022-11-09 LAB — URINE CYTOLOGY ANCILLARY ONLY
Chlamydia: NEGATIVE
Comment: NEGATIVE
Comment: NORMAL
Neisseria Gonorrhea: NEGATIVE

## 2022-11-09 LAB — CYTOLOGY, (ORAL, ANAL, URETHRAL) ANCILLARY ONLY
Chlamydia: NEGATIVE
Chlamydia: NEGATIVE
Comment: NEGATIVE
Comment: NEGATIVE
Comment: NORMAL
Comment: NORMAL
Neisseria Gonorrhea: NEGATIVE
Neisseria Gonorrhea: NEGATIVE

## 2022-11-09 LAB — RPR: RPR Ser Ql: REACTIVE — AB

## 2022-11-09 LAB — T PALLIDUM AB: T Pallidum Abs: POSITIVE — AB

## 2022-11-09 LAB — HIV-1 RNA QUANT-NO REFLEX-BLD
HIV 1 RNA Quant: <20 Copies/mL — ABNORMAL HIGH
HIV-1 RNA Quant, Log: <1.3 Log cps/mL — ABNORMAL HIGH

## 2022-11-09 LAB — RPR TITER: RPR Titer: 1:2 {titer} — ABNORMAL HIGH

## 2022-12-05 ENCOUNTER — Ambulatory Visit: Payer: BC Managed Care – PPO | Admitting: Family Medicine

## 2022-12-08 ENCOUNTER — Ambulatory Visit: Payer: Self-pay | Admitting: Infectious Disease

## 2022-12-20 ENCOUNTER — Encounter: Payer: Self-pay | Admitting: Family Medicine

## 2023-01-09 ENCOUNTER — Ambulatory Visit (INDEPENDENT_AMBULATORY_CARE_PROVIDER_SITE_OTHER): Payer: BC Managed Care – PPO | Admitting: Pharmacist

## 2023-01-09 ENCOUNTER — Encounter: Payer: Self-pay | Admitting: Pharmacist

## 2023-01-09 ENCOUNTER — Other Ambulatory Visit: Payer: Self-pay

## 2023-01-09 DIAGNOSIS — Z23 Encounter for immunization: Secondary | ICD-10-CM

## 2023-01-09 DIAGNOSIS — B20 Human immunodeficiency virus [HIV] disease: Secondary | ICD-10-CM

## 2023-01-09 MED ORDER — CABOTEGRAVIR & RILPIVIRINE ER 600 & 900 MG/3ML IM SUER
1.0000 | Freq: Once | INTRAMUSCULAR | Status: AC
Start: 2023-01-09 — End: 2023-01-09
  Administered 2023-01-09: 1 via INTRAMUSCULAR

## 2023-01-09 NOTE — Progress Notes (Signed)
HPI: Connor [name] is a 37 y.o. male who presents to the [facility] pharmacy clinic for [place] administration.  Patient Active Problem List   Diagnosis Date Noted   Routine screening for STI (sexually transmitted infection) 01/03/2022   Upper respiratory infection, acute 01/18/2021   Sore throat 01/18/2021   Cough 01/18/2021   Tinea corporis 03/05/2020   Prediabetes 09/13/2018   GERD (gastroesophageal reflux disease) 09/13/2018   Obesity (BMI 30-39.9) 06/01/2018   Vitamin D deficiency 05/16/2016   Anxiety and depression 05/16/2016   Insomnia 06/28/2011   Human immunodeficiency virus (HIV) disease (HCC) 07/13/2009    Patient's Medications  New Prescriptions   No medications on file  Previous Medications   CABOTEGRAVIR & RILPIVIRINE ER (CABENUVA) 600 & 900 MG/3ML INJECTION    Inject 1 kit into the muscle every 2 (two) months.   DOXYCYCLINE (VIBRA-TABS) 100 MG TABLET    Take 2 tablets (200 mg total) by mouth after sex to prevent STIs.   ESCITALOPRAM (LEXAPRO) 20 MG TABLET    Take 1 tablet (20 mg total) by mouth daily.   MONTELUKAST (SINGULAIR) 10 MG TABLET    Take 1 tablet (10 mg total) by mouth at bedtime.   ROSUVASTATIN (CRESTOR) 20 MG TABLET    Take 1 tablet (20 mg total) by mouth daily.  Modified Medications   No medications on file  Discontinued Medications   No medications on file    Allergies: No Known Allergies  Labs: Lab Results  Component Value Date   HIV1RNAQUANT <20 (H) 11/07/2022   HIV1RNAQUANT <20 DETECTED (A) 09/05/2022   HIV1RNAQUANT <20 05/20/2022   CD4TABS 526 05/20/2022   CD4TABS 567 01/03/2022   CD4TABS 713 01/11/2021    RPR and STI Lab Results  Component Value Date   LABRPR REACTIVE (A) 11/07/2022   LABRPR REACTIVE (A) 09/05/2022   LABRPR REACTIVE (A) 07/11/2022   LABRPR REACTIVE (A) 01/03/2022   LABRPR REACTIVE (A) 09/06/2021   RPRTITER 1:2 (H) 11/07/2022   RPRTITER 1:2 (H) 09/05/2022   RPRTITER 1:2 (H) 07/11/2022   RPRTITER 1:4 (H)  01/03/2022   RPRTITER 1:4 (H) 09/06/2021    STI Results GC CT  11/07/2022  9:53 AM Negative    Negative    Negative  Negative    Negative    Negative   09/05/2022  2:16 PM Negative    Negative    Negative  Negative    Negative    Negative   08/08/2022  9:18 AM Negative  Negative   07/11/2022  9:40 AM Negative    Positive    Negative  Negative    Negative    Negative   01/03/2022  9:06 AM Negative    Negative    Negative  Negative    Negative    Negative   09/06/2021  9:14 AM Negative  Negative   03/08/2021  9:16 AM Negative  Negative   11/16/2020 10:53 AM Negative  Negative   05/07/2019  3:08 PM Negative  Negative   11/20/2017 12:00 AM Negative  Negative   07/19/2016 12:00 AM Negative  Negative   09/28/2015 12:00 AM Negative  Negative     Hepatitis B Lab Results  Component Value Date   HEPBSAB REACTIVE (A) 09/06/2021   HEPBSAG NEG 07/13/2009   HEPBCAB NEG 07/13/2009   Hepatitis C No results found for: "HEPCAB", "HCVRNAPCRQN" Hepatitis A Lab Results  Component Value Date   HAV POS (A) 07/13/2009   Lipids: Lab Results  Component Value Date  CHOL 151 09/05/2022   TRIG 66 09/05/2022   HDL 49 09/05/2022   CHOLHDL 3.1 09/05/2022   VLDL 14.7 07/27/2017   LDLCALC 87 09/05/2022    TARGET DATE: The 24th  Assessment: Connor [name] presents today for his maintenance Cabenuva injections. Past injections were tolerated well without issues. Last HIV RNA was undetectable in July. Will defer labs today. Politely declines STI testing.   Administered cabotegravir 600mg /12mL in left upper outer quadrant of the gluteal muscle. Administered rilpivirine 900 mg/72mL in the right upper outer quadrant of the gluteal muscle. No issues with injections. He will follow up in 2 months for next set of injections.  Plan: - Cabenuva injections administered - Next injections scheduled for 03/06/23 with me and 05/08/23 with Dr. Daiva Eves  - Call with any issues or questions  Connor Skoog L.  Connor [name], Connor [name], BCIDP, AAHIVP, CPP Clinical Pharmacist Practitioner Infectious Diseases Clinical Pharmacist [facility]

## 2023-02-24 ENCOUNTER — Other Ambulatory Visit: Payer: Self-pay | Admitting: Medical Genetics

## 2023-02-24 DIAGNOSIS — Z006 Encounter for examination for normal comparison and control in clinical research program: Secondary | ICD-10-CM

## 2023-02-27 ENCOUNTER — Other Ambulatory Visit: Payer: BC Managed Care – PPO

## 2023-03-01 NOTE — Progress Notes (Signed)
.  HPI: Connor [name] is a 37 y.o. male who presents to the [facility] pharmacy clinic for [place] administration.  Patient Active Problem List   Diagnosis Date Noted   Routine screening for STI (sexually transmitted infection) 01/03/2022   Upper respiratory infection, acute 01/18/2021   Sore throat 01/18/2021   Cough 01/18/2021   Tinea corporis 03/05/2020   Prediabetes 09/13/2018   GERD (gastroesophageal reflux disease) 09/13/2018   Obesity (BMI 30-39.9) 06/01/2018   Vitamin D deficiency 05/16/2016   Anxiety and depression 05/16/2016   Insomnia 06/28/2011   Human immunodeficiency virus (HIV) disease (HCC) 07/13/2009    Patient's Medications  New Prescriptions   No medications on file  Previous Medications   CABOTEGRAVIR & RILPIVIRINE ER (CABENUVA) 600 & 900 MG/3ML INJECTION    Inject 1 kit into the muscle every 2 (two) months.   DOXYCYCLINE (VIBRA-TABS) 100 MG TABLET    Take 2 tablets (200 mg total) by mouth after sex to prevent STIs.   ESCITALOPRAM (LEXAPRO) 20 MG TABLET    Take 1 tablet (20 mg total) by mouth daily.   MONTELUKAST (SINGULAIR) 10 MG TABLET    Take 1 tablet (10 mg total) by mouth at bedtime.   ROSUVASTATIN (CRESTOR) 20 MG TABLET    Take 1 tablet (20 mg total) by mouth daily.  Modified Medications   No medications on file  Discontinued Medications   No medications on file    Allergies: No Known Allergies  Labs: Lab Results  Component Value Date   HIV1RNAQUANT <20 (H) 11/07/2022   HIV1RNAQUANT <20 DETECTED (A) 09/05/2022   HIV1RNAQUANT <20 05/20/2022   CD4TABS 526 05/20/2022   CD4TABS 567 01/03/2022   CD4TABS 713 01/11/2021    RPR and STI Lab Results  Component Value Date   LABRPR REACTIVE (A) 11/07/2022   LABRPR REACTIVE (A) 09/05/2022   LABRPR REACTIVE (A) 07/11/2022   LABRPR REACTIVE (A) 01/03/2022   LABRPR REACTIVE (A) 09/06/2021   RPRTITER 1:2 (H) 11/07/2022   RPRTITER 1:2 (H) 09/05/2022   RPRTITER 1:2 (H) 07/11/2022   RPRTITER 1:4 (H)  01/03/2022   RPRTITER 1:4 (H) 09/06/2021    STI Results GC CT  11/07/2022  9:53 AM Negative    Negative    Negative  Negative    Negative    Negative   09/05/2022  2:16 PM Negative    Negative    Negative  Negative    Negative    Negative   08/08/2022  9:18 AM Negative  Negative   07/11/2022  9:40 AM Negative    Positive    Negative  Negative    Negative    Negative   01/03/2022  9:06 AM Negative    Negative    Negative  Negative    Negative    Negative   09/06/2021  9:14 AM Negative  Negative   03/08/2021  9:16 AM Negative  Negative   11/16/2020 10:53 AM Negative  Negative   05/07/2019  3:08 PM Negative  Negative   11/20/2017 12:00 AM Negative  Negative   07/19/2016 12:00 AM Negative  Negative   09/28/2015 12:00 AM Negative  Negative     Hepatitis B Lab Results  Component Value Date   HEPBSAB REACTIVE (A) 09/06/2021   HEPBSAG NEG 07/13/2009   HEPBCAB NEG 07/13/2009   Hepatitis C No results found for: "HEPCAB", "HCVRNAPCRQN" Hepatitis A Lab Results  Component Value Date   HAV POS (A) 07/13/2009   Lipids: Lab Results  Component Value  Date   CHOL 151 09/05/2022   TRIG 66 09/05/2022   HDL 49 09/05/2022   CHOLHDL 3.1 09/05/2022   VLDL 32.4 07/27/2017   LDLCALC 87 09/05/2022    TARGET DATE: The 24th  Assessment: Connor [name] presents today for his maintenance Cabenuva injections. Past injections were tolerated well without issues. Last HIV RNA was undetectable in July; will update today. Agrees to full STI testing today with RPR and oral/urine/rectal cytologies. Also agrees to receive the annual flu and 2024-2025 COVID vaccines today.  Administered cabotegravir 600mg /34mL in left upper outer quadrant of the gluteal muscle. Administered rilpivirine 900 mg/28mL in the right upper outer quadrant of the gluteal muscle. No issues with injections. He will follow up in 2 months for next set of injections.  Plan: - Cabenuva injections administered - HIV RNA, CD4, RPR,  and urine/rectal/pharyngeal cytologies for GC/chlamydia  - Administered the annual flu vaccine today - Administered the 2024-2025 COVID vaccine today  - Next injections scheduled for 05/08/23 with Dr. Daiva Eves and 07/03/23 and 09/04/23 with me - Call with any issues or questions  Riyana Biel L. Devynn Hessler, PharmD, BCIDP, AAHIVP, CPP Clinical Pharmacist Practitioner Infectious Diseases Clinical Pharmacist [facility]

## 2023-03-05 ENCOUNTER — Other Ambulatory Visit: Payer: Self-pay | Admitting: Family Medicine

## 2023-03-05 ENCOUNTER — Other Ambulatory Visit: Payer: Self-pay | Admitting: Infectious Disease

## 2023-03-05 DIAGNOSIS — F419 Anxiety disorder, unspecified: Secondary | ICD-10-CM

## 2023-03-05 DIAGNOSIS — F32A Depression, unspecified: Secondary | ICD-10-CM

## 2023-03-06 ENCOUNTER — Other Ambulatory Visit (HOSPITAL_COMMUNITY): Payer: Self-pay

## 2023-03-06 ENCOUNTER — Other Ambulatory Visit: Payer: Self-pay

## 2023-03-06 ENCOUNTER — Ambulatory Visit (INDEPENDENT_AMBULATORY_CARE_PROVIDER_SITE_OTHER): Payer: BC Managed Care – PPO | Admitting: Pharmacist

## 2023-03-06 ENCOUNTER — Other Ambulatory Visit (HOSPITAL_COMMUNITY)
Admission: RE | Admit: 2023-03-06 | Discharge: 2023-03-06 | Disposition: A | Discharge status: Home / Self Care | Payer: BC Managed Care – PPO | Source: Ambulatory Visit | Attending: Infectious Disease | Admitting: Infectious Disease

## 2023-03-06 DIAGNOSIS — B20 Human immunodeficiency virus [HIV] disease: Secondary | ICD-10-CM | POA: Diagnosis not present

## 2023-03-06 DIAGNOSIS — Z23 Encounter for immunization: Secondary | ICD-10-CM | POA: Diagnosis not present

## 2023-03-06 DIAGNOSIS — Z113 Encounter for screening for infections with a predominantly sexual mode of transmission: Secondary | ICD-10-CM | POA: Diagnosis present

## 2023-03-06 MED ORDER — CABOTEGRAVIR & RILPIVIRINE ER 600 & 900 MG/3ML IM SUER
1.0000 | Freq: Once | INTRAMUSCULAR | Status: AC
  Administered 2023-03-06: 1 via INTRAMUSCULAR

## 2023-03-06 MED ORDER — ESCITALOPRAM OXALATE 20 MG PO TABS
20.0000 mg | ORAL_TABLET | Freq: Every day | ORAL | 1 refills | Status: DC
  Filled 2023-03-06: qty 30, 30d supply, fill #0
  Filled 2023-06-14: qty 30, 30d supply, fill #1

## 2023-03-06 MED ORDER — ROSUVASTATIN CALCIUM 20 MG PO TABS
20.0000 mg | ORAL_TABLET | Freq: Every day | ORAL | 5 refills | Status: DC
  Filled 2023-03-06: qty 30, 30d supply, fill #0
  Filled 2023-08-10: qty 30, 30d supply, fill #1
  Filled 2023-10-25: qty 30, 30d supply, fill #2

## 2023-03-07 LAB — URINE CYTOLOGY ANCILLARY ONLY
Chlamydia: NEGATIVE
Comment: NEGATIVE
Comment: NORMAL
Neisseria Gonorrhea: NEGATIVE

## 2023-03-07 LAB — CYTOLOGY, (ORAL, ANAL, URETHRAL) ANCILLARY ONLY
Chlamydia: NEGATIVE
Chlamydia: NEGATIVE
Comment: NEGATIVE
Comment: NEGATIVE
Comment: NORMAL
Comment: NORMAL
Neisseria Gonorrhea: NEGATIVE
Neisseria Gonorrhea: NEGATIVE

## 2023-03-08 LAB — RPR TITER: RPR Titer: 1:2 {titer} — ABNORMAL HIGH

## 2023-03-08 LAB — T-HELPER CELLS (CD4) COUNT (NOT AT ARMC)
CD4 % Helper T Cell: 29 % — ABNORMAL LOW (ref 33–65)
CD4 T Cell Abs: 698 /uL (ref 400–1790)

## 2023-03-08 LAB — HIV-1 RNA QUANT-NO REFLEX-BLD
HIV 1 RNA Quant: NOT DETECTED {copies}/mL
HIV-1 RNA Quant, Log: NOT DETECTED {Log_copies}/mL

## 2023-03-08 LAB — T PALLIDUM AB: T Pallidum Abs: POSITIVE — AB

## 2023-03-08 LAB — RPR: RPR Ser Ql: REACTIVE — AB

## 2023-03-27 ENCOUNTER — Other Ambulatory Visit (HOSPITAL_COMMUNITY): Payer: BC Managed Care – PPO | Attending: Medical Genetics

## 2023-05-07 NOTE — Progress Notes (Unsigned)
Subjective:  Chief complaint follow-up for HIV disease on medications   Patient ID: Connor [name], male    DOB: May 09, 1985, 38 y.o.   MRN: [id number]  HPI  Discussed the use of AI scribe software for clinical note transcription with the patient, who gave verbal consent to proceed.  History of Present Illness   Connor [name], a patient with a history of HIV, presents for a routine follow-up. They have been on Cabenuva for approximately two years and have had undetectable viral loads. They have not reported any new symptoms or concerns. They received their COVID and flu vaccines in November. They also had a normal anal Pap smear last year, which will be due for a repeat next year. They had a pneumonia shot in 2021, but it is unclear if it was the Prevnar 20 vaccine. If not, they will need to receive it next year. They also had a low vitamin D level at some point, which will be rechecked. They had STI testing for gonorrhea, but do not feel the need for a retest at this time.       Past Medical History:  Diagnosis Date   Anxiety    Cough 01/18/2021   Depression    History of syphilis    HIV infection (HCC)    Routine screening for STI (sexually transmitted infection) 01/03/2022   Sore throat 01/18/2021   Ulcerative colitis (HCC)    Upper respiratory infection, acute 01/18/2021   Vitamin D deficiency     No past surgical history on file.  Family History  Problem Relation Age of Onset   Hypertension Mother    Alcohol abuse Father    Hypertension Father    Diabetes Maternal Grandmother    Stroke Paternal Grandfather    Cancer Neg Hx       Social History   Socioeconomic History   Marital status: Single    Spouse name: Not on file   Number of children: Not on file   Years of education: Not on file   Highest education level: Not on file  Occupational History   Not on file  Tobacco Use   Smoking status: Never    Passive exposure: Never   Smokeless tobacco: Never  Vaping Use    Vaping status: Never Used  Substance and Sexual Activity   Alcohol use: No    Alcohol/week: 0.0 standard drinks of alcohol   Drug use: No   Sexual activity: Yes    Partners: Female, Male    Birth control/protection: Condom    Comment: refused condoms  Other Topics Concern   Not on file  Social History Narrative   Not on file   Social Drivers of Health   Financial Resource Strain: Not on file  Food Insecurity: Not on file  Transportation Needs: Not on file  Physical Activity: Not on file  Stress: Not on file  Social Connections: Unknown (09/02/2021)   Received from [facility]   Social Network    Social Network: Not on file    No Known Allergies   Current Outpatient Medications:    cabotegravir & rilpivirine ER (CABENUVA) 600 & 900 MG/3ML injection, Inject 1 kit into the muscle every 2 (two) months., Disp: 6 mL, Rfl: 5   doxycycline (VIBRA-TABS) 100 MG tablet, Take 2 tablets (200 mg total) by mouth after sex to prevent STIs., Disp: 60 tablet, Rfl: 5   escitalopram (LEXAPRO) 20 MG tablet, Take 1 tablet (20 mg total) by mouth daily.,  Disp: 30 tablet, Rfl: 1   montelukast (SINGULAIR) 10 MG tablet, Take 1 tablet (10 mg total) by mouth at bedtime., Disp: 30 tablet, Rfl: 2   rosuvastatin (CRESTOR) 20 MG tablet, Take 1 tablet (20 mg total) by mouth daily., Disp: 30 tablet, Rfl: 5    Review of Systems  Constitutional:  Negative for activity change, appetite change, chills, diaphoresis, fatigue, fever and unexpected weight change.  HENT:  Negative for congestion, rhinorrhea, sinus pressure, sneezing, sore throat and trouble swallowing.   Eyes:  Negative for photophobia and visual disturbance.  Respiratory:  Negative for cough, chest tightness, shortness of breath, wheezing and stridor.   Cardiovascular:  Negative for chest pain, palpitations and leg swelling.  Gastrointestinal:  Negative for abdominal distention, abdominal pain, anal bleeding, blood in stool,  constipation, diarrhea, nausea and vomiting.  Genitourinary:  Negative for difficulty urinating, dysuria, flank pain and hematuria.  Musculoskeletal:  Negative for arthralgias, back pain, gait problem, joint swelling and myalgias.  Skin:  Negative for color change, pallor, rash and wound.  Neurological:  Negative for dizziness, tremors, weakness and light-headedness.  Hematological:  Negative for adenopathy. Does not bruise/bleed easily.  Psychiatric/Behavioral:  Negative for agitation, behavioral problems, confusion, decreased concentration, dysphoric mood and sleep disturbance.        Objective:   Physical Exam Constitutional:      Appearance: He is well-developed.  HENT:     Head: Normocephalic and atraumatic.  Eyes:     Conjunctiva/sclera: Conjunctivae normal.  Cardiovascular:     Rate and Rhythm: Normal rate and regular rhythm.  Pulmonary:     Effort: Pulmonary effort is normal. No respiratory distress.     Breath sounds: No wheezing.  Abdominal:     General: There is no distension.     Palpations: Abdomen is soft.  Musculoskeletal:        General: No tenderness. Normal range of motion.     Cervical back: Normal range of motion and neck supple.  Skin:    General: Skin is warm and dry.     Coloration: Skin is not pale.     Findings: No erythema or rash.  Neurological:     General: No focal deficit present.     Mental Status: He is alert and oriented to person, place, and time.  Psychiatric:        Mood and Affect: Mood normal.        Behavior: Behavior normal.        Thought Content: Thought content normal.        Judgment: Judgment normal.           Assessment & Plan:   Assessment and Plan    HIV Stable on Cabenuva for 2 years with undetectable viral load. Discussed the rare risk of virological failure despite on-time injections and the importance of regular monitoring. -Continue Cabenuva injections as scheduled. -Check viral load today.  Vitamin D  deficiency History of low levels. -Check Vitamin D level today.  Pneumococcal Vaccination Unclear if patient received Prevnar 20 in 2021. -If not Prevnar 20, administer in 2026.  Anal Pap Smear Last smear was normal. -Repeat anal Pap smear at 1-year mark.  Potential participation in clinical trial Discussed upcoming trial for ultra-long-acting Cabenuva (every 4 months). Patient expressed interest. -If patient is eligible, consider reaching out for participation in the trial.  General Health Maintenance -Continue current vaccination schedule. -Consider STI testing if indicated.

## 2023-05-08 ENCOUNTER — Ambulatory Visit (INDEPENDENT_AMBULATORY_CARE_PROVIDER_SITE_OTHER): Payer: BC Managed Care – PPO | Admitting: Infectious Disease

## 2023-05-08 ENCOUNTER — Other Ambulatory Visit: Payer: Self-pay

## 2023-05-08 ENCOUNTER — Encounter: Payer: Self-pay | Admitting: Infectious Disease

## 2023-05-08 VITALS — BP 139/82 | HR 82 | Resp 16 | Ht 71.0 in | Wt 249.0 lb

## 2023-05-08 DIAGNOSIS — B20 Human immunodeficiency virus [HIV] disease: Secondary | ICD-10-CM

## 2023-05-08 DIAGNOSIS — Z7185 Encounter for immunization safety counseling: Secondary | ICD-10-CM

## 2023-05-08 DIAGNOSIS — Z113 Encounter for screening for infections with a predominantly sexual mode of transmission: Secondary | ICD-10-CM

## 2023-05-08 DIAGNOSIS — E559 Vitamin D deficiency, unspecified: Secondary | ICD-10-CM

## 2023-05-08 DIAGNOSIS — F419 Anxiety disorder, unspecified: Secondary | ICD-10-CM

## 2023-05-08 MED ORDER — CABOTEGRAVIR & RILPIVIRINE ER 600 & 900 MG/3ML IM SUER
1.0000 | Freq: Once | INTRAMUSCULAR | Status: AC
  Administered 2023-05-08: 1 via INTRAMUSCULAR

## 2023-05-09 ENCOUNTER — Other Ambulatory Visit (HOSPITAL_COMMUNITY): Payer: Self-pay

## 2023-05-13 LAB — HIV RNA, RTPCR W/R GT (RTI, PI,INT)
HIV 1 RNA Quant: <20 {copies}/mL — AB
HIV-1 RNA Quant, Log: <1.3 {Log} — AB

## 2023-05-13 LAB — VITAMIN D 25 HYDROXY (VIT D DEFICIENCY, FRACTURES): Vit D, 25-Hydroxy: 43 ng/mL (ref 30–100)

## 2023-05-15 ENCOUNTER — Other Ambulatory Visit: Payer: Self-pay

## 2023-05-15 ENCOUNTER — Other Ambulatory Visit (HOSPITAL_COMMUNITY): Payer: Self-pay

## 2023-05-15 MED ORDER — ATOMOXETINE HCL 40 MG PO CAPS
40.0000 mg | ORAL_CAPSULE | Freq: Every day | ORAL | 1 refills | Status: AC
  Filled 2023-05-15 (×2): qty 30, 30d supply, fill #0
  Filled 2023-06-14: qty 30, 30d supply, fill #1

## 2023-06-15 ENCOUNTER — Other Ambulatory Visit (HOSPITAL_COMMUNITY): Payer: Self-pay

## 2023-06-28 NOTE — Progress Notes (Signed)
 HPI: Connor [name] is a 38 y.o. male who presents to the [facility] pharmacy clinic for [place] administration.  Patient Active Problem List   Diagnosis Date Noted   Routine screening for STI (sexually transmitted infection) 01/03/2022   Upper respiratory infection, acute 01/18/2021   Sore throat 01/18/2021   Cough 01/18/2021   Tinea corporis 03/05/2020   Prediabetes 09/13/2018   GERD (gastroesophageal reflux disease) 09/13/2018   Obesity (BMI 30-39.9) 06/01/2018   Vitamin D deficiency 05/16/2016   Anxiety and depression 05/16/2016   Insomnia 06/28/2011   Human immunodeficiency virus (HIV) disease (HCC) 07/13/2009    Patient's Medications  New Prescriptions   No medications on file  Previous Medications   ATOMOXETINE (STRATTERA) 40 MG CAPSULE    Take 1 capsule (40 mg total) by mouth daily.   CABOTEGRAVIR & RILPIVIRINE ER (CABENUVA) 600 & 900 MG/3ML INJECTION    Inject 1 kit into the muscle every 2 (two) months.   DOXYCYCLINE (VIBRA-TABS) 100 MG TABLET    Take 2 tablets (200 mg total) by mouth after sex to prevent STIs.   ESCITALOPRAM (LEXAPRO) 20 MG TABLET    Take 1 tablet (20 mg total) by mouth daily.   MONTELUKAST (SINGULAIR) 10 MG TABLET    Take 1 tablet (10 mg total) by mouth at bedtime.   ROSUVASTATIN (CRESTOR) 20 MG TABLET    Take 1 tablet (20 mg total) by mouth daily.  Modified Medications   No medications on file  Discontinued Medications   No medications on file    Allergies: No Known Allergies  Labs: Lab Results  Component Value Date   HIV1RNAQUANT <20 DETECTED (A) 05/08/2023   HIV1RNAQUANT Not Detected 03/06/2023   HIV1RNAQUANT <20 (H) 11/07/2022   CD4TABS 698 03/06/2023   CD4TABS 526 05/20/2022   CD4TABS 567 01/03/2022    RPR and STI Lab Results  Component Value Date   LABRPR REACTIVE (A) 03/06/2023   LABRPR REACTIVE (A) 11/07/2022   LABRPR REACTIVE (A) 09/05/2022   LABRPR REACTIVE (A) 07/11/2022   LABRPR REACTIVE (A) 01/03/2022   RPRTITER 1:2  (H) 03/06/2023   RPRTITER 1:2 (H) 11/07/2022   RPRTITER 1:2 (H) 09/05/2022   RPRTITER 1:2 (H) 07/11/2022   RPRTITER 1:4 (H) 01/03/2022    STI Results GC CT  03/06/2023  9:31 AM Negative    Negative    Negative  Negative    Negative    Negative   11/07/2022  9:53 AM Negative    Negative    Negative  Negative    Negative    Negative   09/05/2022  2:16 PM Negative    Negative    Negative  Negative    Negative    Negative   08/08/2022  9:18 AM Negative  Negative   07/11/2022  9:40 AM Negative    Positive    Negative  Negative    Negative    Negative   01/03/2022  9:06 AM Negative    Negative    Negative  Negative    Negative    Negative   09/06/2021  9:14 AM Negative  Negative   03/08/2021  9:16 AM Negative  Negative   11/16/2020 10:53 AM Negative  Negative   05/07/2019  3:08 PM Negative  Negative   11/20/2017 12:00 AM Negative  Negative   07/19/2016 12:00 AM Negative  Negative   09/28/2015 12:00 AM Negative  Negative     Hepatitis B Lab Results  Component Value Date   HEPBSAB REACTIVE (  A) 09/06/2021   HEPBSAG NEG 07/13/2009   HEPBCAB NEG 07/13/2009   Hepatitis C No results found for: "HEPCAB", "HCVRNAPCRQN" Hepatitis A Lab Results  Component Value Date   HAV POS (A) 07/13/2009   Lipids: Lab Results  Component Value Date   CHOL 151 09/05/2022   TRIG 66 09/05/2022   HDL 49 09/05/2022   CHOLHDL 3.1 09/05/2022   VLDL 65.7 07/27/2017   LDLCALC 87 09/05/2022    TARGET DATE: The 24th  Assessment: Connor [name] presents today for his maintenance Cabenuva injections. Past injections were tolerated well without issues. Last HIV RNA was undetectable in January. Last STI testing was in November; requesting testing today. A little troubled by his HIV RNA results saying "<20 detected". Explained that this still meant undetectable but requesting a recheck today so will order that as well.   Administered cabotegravir 600mg /81mL in left upper outer quadrant of the  gluteal muscle. Administered rilpivirine 900 mg/53mL in the right upper outer quadrant of the gluteal muscle. No issues with injections. He will follow up in 2 months for next set of injections.  Plan: - Cabenuva injections administered - HIV RNA, RPR, and urine/rectal/pharyngeal cytologies for GC/chlamydia  - Next injections scheduled for 09/04/23 and 11/06/23 with me - Call with any issues or questions  Connor [name], PharmD, BCIDP, AAHIVP, CPP Clinical Pharmacist Practitioner Infectious Diseases Clinical Pharmacist [facility]

## 2023-07-03 ENCOUNTER — Ambulatory Visit (INDEPENDENT_AMBULATORY_CARE_PROVIDER_SITE_OTHER): Payer: BC Managed Care – PPO | Admitting: Pharmacist

## 2023-07-03 ENCOUNTER — Other Ambulatory Visit: Payer: Self-pay

## 2023-07-03 DIAGNOSIS — B20 Human immunodeficiency virus [HIV] disease: Secondary | ICD-10-CM

## 2023-07-03 DIAGNOSIS — Z113 Encounter for screening for infections with a predominantly sexual mode of transmission: Secondary | ICD-10-CM

## 2023-07-03 MED ORDER — CABOTEGRAVIR & RILPIVIRINE ER 600 & 900 MG/3ML IM SUER
1.0000 | Freq: Once | INTRAMUSCULAR | Status: AC
  Administered 2023-07-03: 1 via INTRAMUSCULAR

## 2023-07-04 LAB — CT/NG RNA, TMA RECTAL
Chlamydia Trachomatis RNA: NOT DETECTED
Neisseria Gonorrhoeae RNA: NOT DETECTED

## 2023-07-04 LAB — GC/CHLAMYDIA PROBE, AMP (THROAT)
Chlamydia trachomatis RNA: NOT DETECTED
Neisseria gonorrhoeae RNA: NOT DETECTED

## 2023-07-04 LAB — C. TRACHOMATIS/N. GONORRHOEAE RNA
C. trachomatis RNA, TMA: NOT DETECTED
N. gonorrhoeae RNA, TMA: NOT DETECTED

## 2023-07-05 LAB — RPR TITER: RPR Titer: 1:4 {titer} — ABNORMAL HIGH

## 2023-07-05 LAB — T PALLIDUM AB: T Pallidum Abs: POSITIVE — AB

## 2023-07-05 LAB — HIV-1 RNA QUANT-NO REFLEX-BLD
HIV 1 RNA Quant: <20 {copies}/mL — AB
HIV-1 RNA Quant, Log: <1.3 {Log_copies}/mL — AB

## 2023-07-05 LAB — RPR: RPR Ser Ql: REACTIVE — AB

## 2023-07-16 ENCOUNTER — Telehealth: Admitting: Nurse Practitioner

## 2023-07-16 DIAGNOSIS — M545 Low back pain, unspecified: Secondary | ICD-10-CM | POA: Diagnosis not present

## 2023-07-16 MED ORDER — METHOCARBAMOL 750 MG PO TABS: 750.0000 mg | ORAL_TABLET | Freq: Three times a day (TID) | ORAL | 0 refills | Status: DC | PRN

## 2023-07-16 MED ORDER — NAPROXEN 500 MG PO TABS: 500.0000 mg | ORAL_TABLET | Freq: Two times a day (BID) | ORAL | 0 refills | Status: AC

## 2023-07-16 NOTE — Progress Notes (Signed)
 I have spent 5 minutes in review of e-visit questionnaire, review and updating patient chart, medical decision making and response to patient.   Claiborne Rigg, NP

## 2023-07-16 NOTE — Progress Notes (Signed)
 E-Visit for Back Pain   We are sorry that you are not feeling well.  Here is how we plan to help!  Based on what you have shared with me it looks like you mostly have acute back pain.  Acute back pain is defined as musculoskeletal pain that can resolve in 1-3 weeks with conservative treatment.  I have prescribed Naprosyn 500 mg take one by mouth twice a day non-steroid anti-inflammatory (NSAID) as well as methocarbamol which is a muscle relaxant.  Some patients experience stomach irritation or in increased heartburn with anti-inflammatory drugs.  Please keep in mind that muscle relaxer's can cause fatigue and should not be taken while at work or driving.  Back pain is very common.  The pain often gets better over time.  The cause of back pain is usually not dangerous.  Most people can learn to manage their back pain on their own.  Home Care Stay active.  Start with short walks on flat ground if you can.  Try to walk farther each day. Do not sit, drive or stand in one place for more than 30 minutes.  Do not stay in bed. Do not avoid exercise or work.  Activity can help your back heal faster. Be careful when you bend or lift an object.  Bend at your knees, keep the object close to you, and do not twist. Sleep on a firm mattress.  Lie on your side, and bend your knees.  If you lie on your back, put a pillow under your knees. Only take medicines as told by your doctor. Put ice on the injured area. Put ice in a plastic bag Place a towel between your skin and the bag Leave the ice on for 15-20 minutes, 3-4 times a day for the first 2-3 days. 210 After that, you can switch between ice and heat packs. Ask your doctor about back exercises or massage. Avoid feeling anxious or stressed.  Find good ways to deal with stress, such as exercise.  Get Help Right Way If: Your pain does not go away with rest or medicine. Your pain does not go away in 1 week. You have new problems. You do not feel well. The  pain spreads into your legs. You cannot control when you poop (bowel movement) or pee (urinate) You feel sick to your stomach (nauseous) or throw up (vomit) You have belly (abdominal) pain. You feel like you may pass out (faint). If you develop a fever.  Make Sure you: Understand these instructions. Will watch your condition Will get help right away if you are not doing well or get worse.  Your e-visit answers were reviewed by a board certified advanced clinical practitioner to complete your personal care plan.  Depending on the condition, your plan could have included both over the counter or prescription medications.  If there is a problem please reply  once you have received a response from your provider.  Your safety is important to [place].  If you have drug allergies check your prescription carefully.    You can use MyChart to ask questions about today's visit, request a non-urgent call back, or ask for a work or school excuse for 24 hours related to this e-Visit. If it has been greater than 24 hours you will need to follow up with your provider, or enter a new e-Visit to address those concerns.  You will get an e-mail in the next two days asking about your experience.  I hope that your  e-visit has been valuable and will speed your recovery. Thank you for using e-visits.

## 2023-07-26 ENCOUNTER — Encounter: Payer: Self-pay | Admitting: Family Medicine

## 2023-08-01 ENCOUNTER — Ambulatory Visit

## 2023-08-01 ENCOUNTER — Encounter: Payer: Self-pay | Admitting: Family Medicine

## 2023-08-01 ENCOUNTER — Ambulatory Visit: Admitting: Family Medicine

## 2023-08-01 VITALS — BP 135/82 | HR 69 | Temp 98.1°F | Resp 18 | Ht 71.0 in | Wt 237.0 lb

## 2023-08-01 DIAGNOSIS — M545 Low back pain, unspecified: Secondary | ICD-10-CM | POA: Diagnosis not present

## 2023-08-01 MED ORDER — MELOXICAM 15 MG PO TABS: 15.0000 mg | ORAL_TABLET | Freq: Every day | ORAL | 1 refills | Status: AC | PRN

## 2023-08-01 MED ORDER — BACLOFEN 10 MG PO TABS: 10.0000 mg | ORAL_TABLET | Freq: Three times a day (TID) | ORAL | 0 refills | Status: AC | PRN

## 2023-08-01 NOTE — Progress Notes (Signed)
 Established Patient Office Visit  Subjective   Patient ID: AMMAR MOFFATT, male    DOB: 01-16-1986  Age: 38 y.o. MRN: [id number]  Chief Complaint  Patient presents with   Back Pain    Patient states that he has been having lower right sided back pain for about two weeks, He states that he did go to urgent care and was prescribed a steroid and states that the pain has eased up since then, but it still is there.     Back Pain  Back pain Started 2 weeks ago, felt different this time than previous episodes as it felt like someone was pushing on his back. He  reports it's on the right lower side and down to his buttock. Pain worse with bending and moving certain ways. He went to urgent care and they gave him Steroid dose pack and mobic. The first time he got Naproxen and Flexeril which didn't help. He's been doing stretches at home.   Review of Systems  Musculoskeletal:  Positive for back pain.  All other systems reviewed and are negative.    Objective:     BP 135/82   Pulse 69   Temp 98.1 F (36.7 C) (Oral)   Resp 18   Ht 5\' 11"  (1.803 m)   Wt 237 lb (107.5 kg)   SpO2 99%   BMI 33.05 kg/m  BP Readings from Last 3 Encounters:  08/01/23 135/82  05/08/23 139/82  10/11/22 108/69      Physical Exam Vitals and nursing note reviewed.  Constitutional:      Appearance: Normal appearance. He is normal weight.  HENT:     Head: Normocephalic and atraumatic.     Right Ear: External ear normal.     Left Ear: External ear normal.     Nose: Nose normal.     Mouth/Throat:     Mouth: Mucous membranes are moist.     Pharynx: Oropharynx is clear.  Eyes:     Conjunctiva/sclera: Conjunctivae normal.     Pupils: Pupils are equal, round, and reactive to light.  Cardiovascular:     Rate and Rhythm: Normal rate.  Pulmonary:     Effort: Pulmonary effort is normal.  Musculoskeletal:        General: Normal range of motion.     Cervical back: Normal range of motion.     Comments:  ROM full of lumbar spine with pulling sensation of right lower back  Skin:    General: Skin is warm.     Capillary Refill: Capillary refill takes less than 2 seconds.  Neurological:     General: No focal deficit present.     Mental Status: He is alert and oriented to person, place, and time. Mental status is at baseline.  Psychiatric:        Mood and Affect: Mood normal.        Behavior: Behavior normal.        Thought Content: Thought content normal.        Judgment: Judgment normal.    No results found for any visits on 08/01/23.     The ASCVD Risk score (Arnett DK, et al., 2019) failed to calculate for the following reasons:   The 2019 ASCVD risk score is only valid for ages 28 to 78    Assessment & Plan:   Problem List Items Addressed This Visit   None  Acute right-sided low back pain without sciatica -     DG Lumbar  Spine Complete; Future -     Meloxicam; Take 1 tablet (15 mg total) by mouth daily as needed for pain.  Dispense: 30 tablet; Refill: 1 -     Baclofen; Take 1 tablet (10 mg total) by mouth 3 (three) times daily as needed for muscle spasms.  Dispense: 30 each; Refill: 0   Pt with continued low right back pain. Refilled Mobic 15mg  daily prn and added Baclofen 10mg  TID Send for xrays Continue stretches at home. No follow-ups on file.    Manette Section, MD

## 2023-08-10 ENCOUNTER — Other Ambulatory Visit: Payer: Self-pay | Admitting: Family Medicine

## 2023-08-10 ENCOUNTER — Other Ambulatory Visit (HOSPITAL_COMMUNITY): Payer: Self-pay

## 2023-08-10 DIAGNOSIS — F32A Depression, unspecified: Secondary | ICD-10-CM

## 2023-08-11 ENCOUNTER — Other Ambulatory Visit (HOSPITAL_COMMUNITY): Payer: Self-pay

## 2023-08-11 ENCOUNTER — Other Ambulatory Visit: Payer: Self-pay

## 2023-08-11 MED ORDER — ESCITALOPRAM OXALATE 20 MG PO TABS
20.0000 mg | ORAL_TABLET | Freq: Every day | ORAL | 1 refills | Status: AC
  Filled 2023-08-11: qty 30, 30d supply, fill #0
  Filled 2023-10-25: qty 30, 30d supply, fill #1

## 2023-08-18 ENCOUNTER — Other Ambulatory Visit (HOSPITAL_COMMUNITY): Payer: Self-pay

## 2023-08-18 ENCOUNTER — Other Ambulatory Visit: Payer: Self-pay | Admitting: Family Medicine

## 2023-08-21 ENCOUNTER — Other Ambulatory Visit: Payer: Self-pay

## 2023-08-21 NOTE — Telephone Encounter (Signed)
I never prescribed this for the patient.

## 2023-08-27 ENCOUNTER — Other Ambulatory Visit (HOSPITAL_COMMUNITY): Payer: Self-pay

## 2023-08-30 ENCOUNTER — Other Ambulatory Visit (HOSPITAL_COMMUNITY): Payer: Self-pay

## 2023-09-01 NOTE — Progress Notes (Signed)
 HPI: Connor [name] is a 38 y.o. male who presents to the [facility] pharmacy clinic for Cabenuva  administration.  Patient Active Problem List   Diagnosis Date Noted   Routine screening for STI (sexually transmitted infection) 01/03/2022   Upper respiratory infection, acute 01/18/2021   Sore throat 01/18/2021   Cough 01/18/2021   Tinea corporis 03/05/2020   Prediabetes 09/13/2018   GERD (gastroesophageal reflux disease) 09/13/2018   Obesity (BMI 30-39.9) 06/01/2018   Vitamin D  deficiency 05/16/2016   Anxiety and depression 05/16/2016   Insomnia 06/28/2011   Human immunodeficiency virus (HIV) disease (HCC) 07/13/2009    Patient's Medications  New Prescriptions   No medications on file  Previous Medications   ATOMOXETINE  (STRATTERA ) 40 MG CAPSULE    Take 1 capsule (40 mg total) by mouth daily.   BACLOFEN  (LIORESAL ) 10 MG TABLET    Take 1 tablet (10 mg total) by mouth 3 (three) times daily as needed for muscle spasms.   CABOTEGRAVIR  & RILPIVIRINE  ER (CABENUVA ) 600 & 900 MG/3ML INJECTION    Inject 1 kit into the muscle every 2 (two) months.   DOXYCYCLINE  (VIBRA -TABS) 100 MG TABLET    Take 2 tablets (200 mg total) by mouth after sex to prevent STIs.   ESCITALOPRAM  (LEXAPRO ) 20 MG TABLET    Take 1 tablet (20 mg total) by mouth daily.   MELOXICAM  (MOBIC ) 15 MG TABLET    Take 1 tablet (15 mg total) by mouth daily as needed for pain.   MONTELUKAST  (SINGULAIR ) 10 MG TABLET    Take 1 tablet (10 mg total) by mouth at bedtime.   NAPROXEN  (NAPROSYN ) 500 MG TABLET    Take 1 tablet (500 mg total) by mouth 2 (two) times daily with a meal.   ROSUVASTATIN  (CRESTOR ) 20 MG TABLET    Take 1 tablet (20 mg total) by mouth daily.  Modified Medications   No medications on file  Discontinued Medications   No medications on file    Allergies: No Known Allergies  Labs: Lab Results  Component Value Date   HIV1RNAQUANT <20 DETECTED (A) 07/03/2023   HIV1RNAQUANT <20 DETECTED (A) 05/08/2023    HIV1RNAQUANT Not Detected 03/06/2023   CD4TABS 698 03/06/2023   CD4TABS 526 05/20/2022   CD4TABS 567 01/03/2022    RPR and STI Lab Results  Component Value Date   LABRPR REACTIVE (A) 07/03/2023   LABRPR REACTIVE (A) 03/06/2023   LABRPR REACTIVE (A) 11/07/2022   LABRPR REACTIVE (A) 09/05/2022   LABRPR REACTIVE (A) 07/11/2022   RPRTITER 1:4 (H) 07/03/2023   RPRTITER 1:2 (H) 03/06/2023   RPRTITER 1:2 (H) 11/07/2022   RPRTITER 1:2 (H) 09/05/2022   RPRTITER 1:2 (H) 07/11/2022    STI Results GC CT  03/06/2023  9:31 AM Negative    Negative    Negative  Negative    Negative    Negative   11/07/2022  9:53 AM Negative    Negative    Negative  Negative    Negative    Negative   09/05/2022  2:16 PM Negative    Negative    Negative  Negative    Negative    Negative   08/08/2022  9:18 AM Negative  Negative   07/11/2022  9:40 AM Negative    Positive    Negative  Negative    Negative    Negative   01/03/2022  9:06 AM Negative    Negative    Negative  Negative    Negative  Negative   09/06/2021  9:14 AM Negative  Negative   03/08/2021  9:16 AM Negative  Negative   11/16/2020 10:53 AM Negative  Negative   05/07/2019  3:08 PM Negative  Negative   11/20/2017 12:00 AM Negative  Negative   07/19/2016 12:00 AM Negative  Negative   09/28/2015 12:00 AM Negative  Negative     Hepatitis B Lab Results  Component Value Date   HEPBSAB REACTIVE (A) 09/06/2021   HEPBSAG NEG 07/13/2009   HEPBCAB NEG 07/13/2009   Hepatitis C No results found for: "HEPCAB", "HCVRNAPCRQN" Hepatitis A Lab Results  Component Value Date   HAV POS (A) 07/13/2009   Lipids: Lab Results  Component Value Date   CHOL 151 09/05/2022   TRIG 66 09/05/2022   HDL 49 09/05/2022   CHOLHDL 3.1 09/05/2022   VLDL 16.1 07/27/2017   LDLCALC 87 09/05/2022    TARGET DATE: The 24th  Assessment: Connor [name] presents today for his maintenance Cabenuva  injections. Past injections were tolerated well without  issues. Last HIV RNA was <20 in March. Last STI screening was negative in March. Doing well with no issues today. Agrees to full STI testing today.  Administered cabotegravir  600mg /56mL in left upper outer quadrant of the gluteal muscle. Administered rilpivirine  900 mg/3mL in the right upper outer quadrant of the gluteal muscle. No issues with injections. He will follow up in 2 months for next set of injections.  Discussed eligibility for Shingrix  vaccine and he agreed to receive this today.  Plan: - Cabenuva  injections administered - Administered Shingrix  vaccine dose 1/2 today  - Urine/oral/pharyngeal cytologies and RPR today  - Next injections scheduled for 11/06/23 and 01/08/24 with Cassie - Call with any issues or questions  Georga Killings, PharmD PGY-1 Pharmacy Resident

## 2023-09-04 ENCOUNTER — Other Ambulatory Visit: Payer: Self-pay

## 2023-09-04 ENCOUNTER — Ambulatory Visit (INDEPENDENT_AMBULATORY_CARE_PROVIDER_SITE_OTHER): Payer: Self-pay | Admitting: Pharmacist

## 2023-09-04 DIAGNOSIS — Z113 Encounter for screening for infections with a predominantly sexual mode of transmission: Secondary | ICD-10-CM

## 2023-09-04 DIAGNOSIS — B20 Human immunodeficiency virus [HIV] disease: Secondary | ICD-10-CM

## 2023-09-04 DIAGNOSIS — Z23 Encounter for immunization: Secondary | ICD-10-CM

## 2023-09-04 MED ORDER — CABOTEGRAVIR & RILPIVIRINE ER 600 & 900 MG/3ML IM SUER
1.0000 | Freq: Once | INTRAMUSCULAR | Status: AC
Start: 2023-09-04 — End: 2023-09-04
  Administered 2023-09-04: 1 via INTRAMUSCULAR

## 2023-09-05 LAB — C. TRACHOMATIS/N. GONORRHOEAE RNA
C. trachomatis RNA, TMA: NOT DETECTED
N. gonorrhoeae RNA, TMA: NOT DETECTED

## 2023-09-05 LAB — GC/CHLAMYDIA PROBE, AMP (THROAT)
Chlamydia trachomatis RNA: NOT DETECTED
Neisseria gonorrhoeae RNA: NOT DETECTED

## 2023-09-05 LAB — CT/NG RNA, TMA RECTAL
Chlamydia Trachomatis RNA: NOT DETECTED
Neisseria Gonorrhoeae RNA: NOT DETECTED

## 2023-09-06 LAB — RPR TITER: RPR Titer: 1:2 {titer} — ABNORMAL HIGH

## 2023-09-06 LAB — T PALLIDUM AB: T Pallidum Abs: POSITIVE — AB

## 2023-09-06 LAB — RPR: RPR Ser Ql: REACTIVE — AB

## 2023-10-06 ENCOUNTER — Other Ambulatory Visit (HOSPITAL_COMMUNITY): Payer: Self-pay

## 2023-10-25 ENCOUNTER — Encounter: Payer: Self-pay | Admitting: Family Medicine

## 2023-10-25 ENCOUNTER — Other Ambulatory Visit: Payer: Self-pay

## 2023-10-25 ENCOUNTER — Other Ambulatory Visit (HOSPITAL_COMMUNITY): Payer: Self-pay

## 2023-11-01 NOTE — Progress Notes (Signed)
 HPI: Connor [name] is a 38 y.o. male who presents to the [facility] pharmacy clinic for Cabenuva  administration.  Patient Active Problem List   Diagnosis Date Noted   Routine screening for STI (sexually transmitted infection) 01/03/2022   Upper respiratory infection, acute 01/18/2021   Sore throat 01/18/2021   Cough 01/18/2021   Tinea corporis 03/05/2020   Prediabetes 09/13/2018   GERD (gastroesophageal reflux disease) 09/13/2018   Obesity (BMI 30-39.9) 06/01/2018   Vitamin D  deficiency 05/16/2016   Anxiety and depression 05/16/2016   Insomnia 06/28/2011   Human immunodeficiency virus (HIV) disease (HCC) 07/13/2009    Patient's Medications  New Prescriptions   No medications on file  Previous Medications   ATOMOXETINE  (STRATTERA ) 40 MG CAPSULE    Take 1 capsule (40 mg total) by mouth daily.   BACLOFEN  (LIORESAL ) 10 MG TABLET    Take 1 tablet (10 mg total) by mouth 3 (three) times daily as needed for muscle spasms.   CABOTEGRAVIR  & RILPIVIRINE  ER (CABENUVA ) 600 & 900 MG/3ML INJECTION    Inject 1 kit into the muscle every 2 (two) months.   DOXYCYCLINE  (VIBRA -TABS) 100 MG TABLET    Take 2 tablets (200 mg total) by mouth after sex to prevent STIs.   ESCITALOPRAM  (LEXAPRO ) 20 MG TABLET    Take 1 tablet (20 mg total) by mouth daily.   MELOXICAM  (MOBIC ) 15 MG TABLET    Take 1 tablet (15 mg total) by mouth daily as needed for pain.   MONTELUKAST  (SINGULAIR ) 10 MG TABLET    Take 1 tablet (10 mg total) by mouth at bedtime.   NAPROXEN  (NAPROSYN ) 500 MG TABLET    Take 1 tablet (500 mg total) by mouth 2 (two) times daily with a meal.   ROSUVASTATIN  (CRESTOR ) 20 MG TABLET    Take 1 tablet (20 mg total) by mouth daily.  Modified Medications   No medications on file  Discontinued Medications   No medications on file    Allergies: No Known Allergies  Labs: Lab Results  Component Value Date   HIV1RNAQUANT <20 DETECTED (A) 07/03/2023   HIV1RNAQUANT <20 DETECTED (A) 05/08/2023    HIV1RNAQUANT Not Detected 03/06/2023   CD4TABS 698 03/06/2023   CD4TABS 526 05/20/2022   CD4TABS 567 01/03/2022    RPR and STI Lab Results  Component Value Date   LABRPR REACTIVE (A) 09/04/2023   LABRPR REACTIVE (A) 07/03/2023   LABRPR REACTIVE (A) 03/06/2023   LABRPR REACTIVE (A) 11/07/2022   LABRPR REACTIVE (A) 09/05/2022   RPRTITER 1:2 (H) 09/04/2023   RPRTITER 1:4 (H) 07/03/2023   RPRTITER 1:2 (H) 03/06/2023   RPRTITER 1:2 (H) 11/07/2022   RPRTITER 1:2 (H) 09/05/2022    STI Results GC CT  03/06/2023  9:31 AM Negative    Negative    Negative  Negative    Negative    Negative   11/07/2022  9:53 AM Negative    Negative    Negative  Negative    Negative    Negative   09/05/2022  2:16 PM Negative    Negative    Negative  Negative    Negative    Negative   08/08/2022  9:18 AM Negative  Negative   07/11/2022  9:40 AM Negative    Positive    Negative  Negative    Negative    Negative   01/03/2022  9:06 AM Negative    Negative    Negative  Negative    Negative  Negative   09/06/2021  9:14 AM Negative  Negative   03/08/2021  9:16 AM Negative  Negative   11/16/2020 10:53 AM Negative  Negative   05/07/2019  3:08 PM Negative  Negative   11/20/2017 12:00 AM Negative  Negative   07/19/2016 12:00 AM Negative  Negative   09/28/2015 12:00 AM Negative  Negative     Hepatitis B Lab Results  Component Value Date   HEPBSAB REACTIVE (A) 09/06/2021   HEPBSAG NEG 07/13/2009   HEPBCAB NEG 07/13/2009   Hepatitis C No results found for: HEPCAB, HCVRNAPCRQN Hepatitis A Lab Results  Component Value Date   HAV POS (A) 07/13/2009   Lipids: Lab Results  Component Value Date   CHOL 151 09/05/2022   TRIG 66 09/05/2022   HDL 49 09/05/2022   CHOLHDL 3.1 09/05/2022   VLDL 85.7 07/27/2017   LDLCALC 87 09/05/2022    TARGET DATE: The 24th  Assessment: Connor [name] presents today for his maintenance Cabenuva  injections. Past injections were tolerated well without  issues. Last HIV RNA was undetectable in March. Doing well with no issues today. Due for 2nd Shingles vaccine today and agrees to receiving this. Due for his next visit with Dr. Fleeta Rothman. Requesting STI testing today; last tested in May and was negative.   Administered cabotegravir  600mg /35mL in left upper outer quadrant of the gluteal muscle. Administered rilpivirine  900 mg/3mL in the right upper outer quadrant of the gluteal muscle. No issues with injections. He will follow up in 2 months for next set of injections.  Plan: - Cabenuva  injections administered - RPR and urine/rectal/pharyngeal cytologies for GC/chlamydia  - Shingrix  #2/2 today - Next injections scheduled for 01/15/24 with Dr. Fleeta Rothman and 03/11/24 with me - Call with any issues or questions  Connor [name], PharmD, BCIDP, AAHIVP, CPP Clinical Pharmacist Practitioner - Infectious Diseases Clinical Pharmacist Lead - Specialty Pharmacy [facility] for Infectious Disease

## 2023-11-06 ENCOUNTER — Other Ambulatory Visit (HOSPITAL_COMMUNITY)
Admission: RE | Admit: 2023-11-06 | Discharge: 2023-11-06 | Disposition: A | Discharge status: Home / Self Care | Source: Ambulatory Visit | Attending: Infectious Disease | Admitting: Infectious Disease

## 2023-11-06 ENCOUNTER — Ambulatory Visit: Admitting: Pharmacist

## 2023-11-06 ENCOUNTER — Encounter: Payer: Self-pay | Admitting: Pharmacist

## 2023-11-06 ENCOUNTER — Other Ambulatory Visit: Payer: Self-pay

## 2023-11-06 DIAGNOSIS — Z23 Encounter for immunization: Secondary | ICD-10-CM

## 2023-11-06 DIAGNOSIS — B20 Human immunodeficiency virus [HIV] disease: Secondary | ICD-10-CM | POA: Diagnosis not present

## 2023-11-06 DIAGNOSIS — Z113 Encounter for screening for infections with a predominantly sexual mode of transmission: Secondary | ICD-10-CM | POA: Diagnosis present

## 2023-11-06 MED ORDER — CABOTEGRAVIR & RILPIVIRINE ER 600 & 900 MG/3ML IM SUER
1.0000 | Freq: Once | INTRAMUSCULAR | Status: AC
  Administered 2023-11-06: 1 via INTRAMUSCULAR

## 2023-11-07 LAB — CYTOLOGY, (ORAL, ANAL, URETHRAL) ANCILLARY ONLY
Chlamydia: NEGATIVE
Chlamydia: NEGATIVE
Comment: NEGATIVE
Comment: NEGATIVE
Comment: NORMAL
Comment: NORMAL
Neisseria Gonorrhea: NEGATIVE
Neisseria Gonorrhea: NEGATIVE

## 2023-11-07 LAB — URINE CYTOLOGY ANCILLARY ONLY
Chlamydia: NEGATIVE
Comment: NEGATIVE
Comment: NORMAL
Neisseria Gonorrhea: NEGATIVE

## 2023-11-08 ENCOUNTER — Ambulatory Visit: Payer: Self-pay | Admitting: Pharmacist

## 2023-11-09 LAB — RPR: RPR Ser Ql: REACTIVE — AB

## 2023-11-09 LAB — RPR TITER: RPR Titer: 1:2 {titer} — ABNORMAL HIGH

## 2023-11-09 LAB — T PALLIDUM AB: T Pallidum Abs: POSITIVE — AB

## 2023-12-12 ENCOUNTER — Other Ambulatory Visit (HOSPITAL_COMMUNITY): Payer: Self-pay

## 2023-12-19 ENCOUNTER — Other Ambulatory Visit (HOSPITAL_COMMUNITY): Payer: Self-pay

## 2023-12-25 ENCOUNTER — Other Ambulatory Visit (HOSPITAL_COMMUNITY): Payer: Self-pay

## 2023-12-26 ENCOUNTER — Other Ambulatory Visit (HOSPITAL_COMMUNITY): Payer: Self-pay

## 2024-01-08 ENCOUNTER — Ambulatory Visit: Admitting: Pharmacist

## 2024-01-14 NOTE — Progress Notes (Unsigned)
 Subjective:  Chief complaint: follow-up for HIV disease on medications   Patient ID: Connor [name], male    DOB: June 07, 1985, 38 y.o.   MRN: [id number]  HPI  Past Medical History:  Diagnosis Date   Anxiety    Cough 01/18/2021   Depression    History of syphilis    HIV infection (HCC)    Routine screening for STI (sexually transmitted infection) 01/03/2022   Sore throat 01/18/2021   Ulcerative colitis (HCC)    Upper respiratory infection, acute 01/18/2021   Vitamin D  deficiency     No past surgical history on file.  Family History  Problem Relation Age of Onset   Hypertension Mother    Alcohol abuse Father    Hypertension Father    Diabetes Maternal Grandmother    Stroke Paternal Grandfather    Cancer Neg Hx       Social History   Socioeconomic History   Marital status: Single    Spouse name: Not on file   Number of children: Not on file   Years of education: Not on file   Highest education level: Associate degree: occupational, Scientist, product/process development, or vocational program  Occupational History   Not on file  Tobacco Use   Smoking status: Never    Passive exposure: Never   Smokeless tobacco: Never  Vaping Use   Vaping status: Never Used  Substance and Sexual Activity   Alcohol use: No    Alcohol/week: 0.0 standard drinks of alcohol   Drug use: No   Sexual activity: Yes    Partners: Female, Male    Birth control/protection: Condom    Comment: refused condoms  Other Topics Concern   Not on file  Social History Narrative   Not on file   Social Drivers of Health   Financial Resource Strain: Low Risk  (08/01/2023)   Overall Financial Resource Strain (CARDIA)    Difficulty of Paying Living Expenses: Not very hard  Food Insecurity: No Food Insecurity (08/01/2023)   Hunger Vital Sign    Worried About Running Out of Food in the Last Year: Never true    Ran Out of Food in the Last Year: Never true  Transportation Needs: No Transportation Needs (08/01/2023)   PRAPARE -  Administrator, Civil Service (Medical): No    Lack of Transportation (Non-Medical): No  Physical Activity: Unknown (08/01/2023)   Exercise Vital Sign    Days of Exercise per Week: 0 days    Minutes of Exercise per Session: Not on file  Stress: Stress Concern Present (08/01/2023)   [employer] of Occupational Health - Occupational Stress Questionnaire    Feeling of Stress : To some extent  Social Connections: Moderately Integrated (08/01/2023)   Social Connection and Isolation Panel    Frequency of Communication with Friends and Family: Twice a week    Frequency of Social Gatherings with Friends and Family: Once a week    Attends Religious Services: 1 to 4 times per year    Active Member of [employer] or Organizations: No    Attends Engineer, structural: Not on file    Marital Status: Married    No Known Allergies   Current Outpatient Medications:    atomoxetine  (STRATTERA ) 40 MG capsule, Take 1 capsule (40 mg total) by mouth daily., Disp: 30 capsule, Rfl: 1   baclofen  (LIORESAL ) 10 MG tablet, Take 1 tablet (10 mg total) by mouth 3 (three) times daily as needed for muscle spasms., Disp: 30  each, Rfl: 0   cabotegravir  & rilpivirine  ER (CABENUVA ) 600 & 900 MG/3ML injection, Inject 1 kit into the muscle every 2 (two) months., Disp: 6 mL, Rfl: 5   doxycycline  (VIBRA -TABS) 100 MG tablet, Take 2 tablets (200 mg total) by mouth after sex to prevent STIs., Disp: 60 tablet, Rfl: 5   escitalopram  (LEXAPRO ) 20 MG tablet, Take 1 tablet (20 mg total) by mouth daily., Disp: 90 tablet, Rfl: 1   meloxicam  (MOBIC ) 15 MG tablet, Take 1 tablet (15 mg total) by mouth daily as needed for pain., Disp: 30 tablet, Rfl: 1   montelukast  (SINGULAIR ) 10 MG tablet, Take 1 tablet (10 mg total) by mouth at bedtime., Disp: 30 tablet, Rfl: 2   naproxen  (NAPROSYN ) 500 MG tablet, Take 1 tablet (500 mg total) by mouth 2 (two) times daily with a meal. (Patient not taking: Reported on 08/01/2023), Disp: 60  tablet, Rfl: 0   rosuvastatin  (CRESTOR ) 20 MG tablet, Take 1 tablet (20 mg total) by mouth daily., Disp: 30 tablet, Rfl: 5   Review of Systems     Objective:   Physical Exam        Assessment & Plan:

## 2024-01-15 ENCOUNTER — Other Ambulatory Visit: Payer: Self-pay

## 2024-01-15 ENCOUNTER — Other Ambulatory Visit (HOSPITAL_COMMUNITY)
Admission: RE | Admit: 2024-01-15 | Discharge: 2024-01-15 | Disposition: A | Discharge status: Home / Self Care | Source: Ambulatory Visit | Attending: Infectious Disease | Admitting: Infectious Disease

## 2024-01-15 ENCOUNTER — Other Ambulatory Visit (HOSPITAL_COMMUNITY): Payer: Self-pay

## 2024-01-15 ENCOUNTER — Encounter: Payer: Self-pay | Admitting: Infectious Disease

## 2024-01-15 ENCOUNTER — Ambulatory Visit (INDEPENDENT_AMBULATORY_CARE_PROVIDER_SITE_OTHER): Admitting: Infectious Disease

## 2024-01-15 ENCOUNTER — Other Ambulatory Visit (HOSPITAL_COMMUNITY)
Admission: RE | Admit: 2024-01-15 | Discharge: 2024-01-15 | Disposition: A | Source: Ambulatory Visit | Attending: Infectious Disease | Admitting: Infectious Disease

## 2024-01-15 VITALS — BP 131/77 | HR 71 | Temp 98.1°F | Ht 71.0 in | Wt 235.0 lb

## 2024-01-15 DIAGNOSIS — Z1212 Encounter for screening for malignant neoplasm of rectum: Secondary | ICD-10-CM | POA: Diagnosis present

## 2024-01-15 DIAGNOSIS — Z113 Encounter for screening for infections with a predominantly sexual mode of transmission: Secondary | ICD-10-CM | POA: Insufficient documentation

## 2024-01-15 DIAGNOSIS — B20 Human immunodeficiency virus [HIV] disease: Secondary | ICD-10-CM | POA: Insufficient documentation

## 2024-01-15 DIAGNOSIS — K5100 Ulcerative (chronic) pancolitis without complications: Secondary | ICD-10-CM

## 2024-01-15 DIAGNOSIS — Z79899 Other long term (current) drug therapy: Secondary | ICD-10-CM

## 2024-01-15 DIAGNOSIS — Z23 Encounter for immunization: Secondary | ICD-10-CM

## 2024-01-15 MED ORDER — DOXYCYCLINE HYCLATE 100 MG PO TABS: 200.0000 mg | ORAL_TABLET | ORAL | 5 refills | Status: AC

## 2024-01-15 MED ORDER — ROSUVASTATIN CALCIUM 20 MG PO TABS
20.0000 mg | ORAL_TABLET | Freq: Every day | ORAL | 11 refills | Status: AC
  Filled 2024-01-15: qty 30, 30d supply, fill #0

## 2024-01-15 MED ORDER — CABOTEGRAVIR & RILPIVIRINE ER 600 & 900 MG/3ML IM SUER
1.0000 | Freq: Once | INTRAMUSCULAR | Status: AC
  Administered 2024-01-15: 1 via INTRAMUSCULAR

## 2024-01-16 LAB — CYTOLOGY, (ORAL, ANAL, URETHRAL) ANCILLARY ONLY
Chlamydia: NEGATIVE
Chlamydia: NEGATIVE
Comment: NEGATIVE
Comment: NEGATIVE
Comment: NORMAL
Comment: NORMAL
Neisseria Gonorrhea: NEGATIVE
Neisseria Gonorrhea: POSITIVE — AB

## 2024-01-16 LAB — URINE CYTOLOGY ANCILLARY ONLY
Chlamydia: NEGATIVE
Comment: NEGATIVE
Comment: NORMAL
Neisseria Gonorrhea: NEGATIVE

## 2024-01-17 ENCOUNTER — Other Ambulatory Visit: Payer: Self-pay

## 2024-01-17 ENCOUNTER — Ambulatory Visit (INDEPENDENT_AMBULATORY_CARE_PROVIDER_SITE_OTHER)

## 2024-01-17 ENCOUNTER — Ambulatory Visit: Payer: Self-pay

## 2024-01-17 DIAGNOSIS — A549 Gonococcal infection, unspecified: Secondary | ICD-10-CM

## 2024-01-17 DIAGNOSIS — R85619 Unspecified abnormal cytological findings in specimens from anus: Secondary | ICD-10-CM

## 2024-01-17 LAB — CYTOLOGY - PAP
Comment: NEGATIVE
Diagnosis: UNDETERMINED — AB
High risk HPV: POSITIVE — AB

## 2024-01-17 MED ORDER — CEFTRIAXONE SODIUM 500 MG IJ SOLR
500.0000 mg | Freq: Once | INTRAMUSCULAR | Status: AC
  Administered 2024-01-17: 500 mg via INTRAMUSCULAR

## 2024-01-19 ENCOUNTER — Other Ambulatory Visit (HOSPITAL_COMMUNITY): Payer: Self-pay

## 2024-01-19 LAB — COMPLETE METABOLIC PANEL WITHOUT GFR
AG Ratio: 1.6 (calc) (ref 1.0–2.5)
ALT: 11 U/L (ref 9–46)
AST: 13 U/L (ref 10–40)
Albumin: 4.4 g/dL (ref 3.6–5.1)
Alkaline phosphatase (APISO): 97 U/L (ref 36–130)
BUN: 15 mg/dL (ref 7–25)
CO2: 31 mmol/L (ref 20–32)
Calcium: 9.8 mg/dL (ref 8.6–10.3)
Chloride: 105 mmol/L (ref 98–110)
Creat: 0.92 mg/dL (ref 0.60–1.29)
Globulin: 2.7 g/dL (ref 1.9–3.7)
Glucose, Bld: 97 mg/dL (ref 65–99)
Potassium: 4.1 mmol/L (ref 3.5–5.3)
Sodium: 142 mmol/L (ref 135–146)
Total Bilirubin: 0.3 mg/dL (ref 0.2–1.2)
Total Protein: 7.1 g/dL (ref 6.1–8.1)

## 2024-01-19 LAB — RPR: RPR Ser Ql: REACTIVE — AB

## 2024-01-19 LAB — CBC WITH DIFFERENTIAL/PLATELET
Absolute Lymphocytes: 2673 {cells}/uL (ref 850–3900)
Absolute Monocytes: 683 {cells}/uL (ref 200–950)
Basophils Absolute: 50 {cells}/uL (ref 0–200)
Basophils Relative: 0.5 %
Eosinophils Absolute: 307 {cells}/uL (ref 15–500)
Eosinophils Relative: 3.1 %
HCT: 40 % (ref 38.5–50.0)
Hemoglobin: 12.8 g/dL — ABNORMAL LOW (ref 13.2–17.1)
MCH: 28.4 pg (ref 27.0–33.0)
MCHC: 32 g/dL (ref 32.0–36.0)
MCV: 88.9 fL (ref 80.0–100.0)
MPV: 11.9 fL (ref 7.5–12.5)
Monocytes Relative: 6.9 %
Neutro Abs: 6188 {cells}/uL (ref 1500–7800)
Neutrophils Relative %: 62.5 %
Platelets: 192 Thousand/uL (ref 140–400)
RBC: 4.5 Million/uL (ref 4.20–5.80)
RDW: 12.8 % (ref 11.0–15.0)
Total Lymphocyte: 27 %
WBC: 9.9 Thousand/uL (ref 3.8–10.8)

## 2024-01-19 LAB — LIPID PANEL
Cholesterol: 200 mg/dL — ABNORMAL HIGH (ref <200)
HDL: 56 mg/dL (ref >=40)
LDL Cholesterol (Calc): 128 mg/dL — ABNORMAL HIGH
Non-HDL Cholesterol (Calc): 144 mg/dL — ABNORMAL HIGH (ref <130)
Total CHOL/HDL Ratio: 3.6 (calc) (ref <5.0)
Triglycerides: 72 mg/dL (ref <150)

## 2024-01-19 LAB — HIV-1 RNA QUANT-NO REFLEX-BLD
HIV 1 RNA Quant: NOT DETECTED {copies}/mL
HIV-1 RNA Quant, Log: NOT DETECTED {Log_copies}/mL

## 2024-01-19 LAB — T PALLIDUM AB: T Pallidum Abs: POSITIVE — AB

## 2024-01-19 LAB — RPR TITER: RPR Titer: 1:4 {titer} — ABNORMAL HIGH

## 2024-01-28 ENCOUNTER — Other Ambulatory Visit: Payer: Self-pay | Admitting: Medical Genetics

## 2024-01-28 DIAGNOSIS — Z006 Encounter for examination for normal comparison and control in clinical research program: Secondary | ICD-10-CM

## 2024-02-02 ENCOUNTER — Other Ambulatory Visit (HOSPITAL_COMMUNITY): Payer: Self-pay

## 2024-02-05 ENCOUNTER — Other Ambulatory Visit (HOSPITAL_COMMUNITY): Payer: Self-pay

## 2024-02-05 ENCOUNTER — Telehealth: Payer: Self-pay

## 2024-02-05 NOTE — Telephone Encounter (Signed)
 Pharmacy Patient Advocate Encounter- Cabenuva  BIV-Pharmacy Benefit:  PA was submitted to [facility] and has been approved through: 02/02/24-02/01/25 Authorization# [place] [employer] Welfare fund [id number]  Please send prescription to Specialty Pharmacy: [facility]: [phone]  Estimated Copay is: 200.00  Copay Card

## 2024-02-26 ENCOUNTER — Other Ambulatory Visit: Payer: Self-pay

## 2024-02-26 ENCOUNTER — Other Ambulatory Visit: Payer: Self-pay | Admitting: Pharmacist

## 2024-02-26 DIAGNOSIS — B20 Human immunodeficiency virus [HIV] disease: Secondary | ICD-10-CM

## 2024-02-26 MED ORDER — CABOTEGRAVIR & RILPIVIRINE ER 600 & 900 MG/3ML IM SUER
1.0000 | INTRAMUSCULAR | 5 refills | Status: AC
  Filled 2024-02-26: qty 6, 60d supply, fill #0
  Filled 2024-04-30: qty 6, 60d supply, fill #1

## 2024-02-26 NOTE — Progress Notes (Signed)
 Specialty Pharmacy Initial Fill Coordination Note  Connor [name] is a 38 y.o. male contacted today regarding initial fill of specialty medication(s) Cabotegravir  & Rilpivirine  (CABENUVA )   Patient requested Courier to Provider Office   Delivery date: 03/04/24   Verified address: [address] [place] [address]   Medication will be filled on 03/01/24.   Patient is aware of 0.00 copayment.

## 2024-02-29 ENCOUNTER — Other Ambulatory Visit: Payer: Self-pay

## 2024-03-10 NOTE — Progress Notes (Unsigned)
 HPI: Connor [name] is a 38 y.o. male who presents to the [facility] pharmacy clinic for Cabenuva  administration.  Referring ID Provider: Dr. Fleeta Rothman   Patient Active Problem List   Diagnosis Date Noted   Routine screening for STI (sexually transmitted infection) 01/03/2022   Upper respiratory infection, acute 01/18/2021   Sore throat 01/18/2021   Cough 01/18/2021   Tinea corporis 03/05/2020   Prediabetes 09/13/2018   GERD (gastroesophageal reflux disease) 09/13/2018   Obesity (BMI 30-39.9) 06/01/2018   Vitamin D  deficiency 05/16/2016   Anxiety and depression 05/16/2016   Insomnia 06/28/2011   Human immunodeficiency virus (HIV) disease (HCC) 07/13/2009    Patient's Medications  New Prescriptions   No medications on file  Previous Medications   ATOMOXETINE  (STRATTERA ) 40 MG CAPSULE    Take 1 capsule (40 mg total) by mouth daily.   BACLOFEN  (LIORESAL ) 10 MG TABLET    Take 1 tablet (10 mg total) by mouth 3 (three) times daily as needed for muscle spasms.   CABOTEGRAVIR  & RILPIVIRINE  ER (CABENUVA ) 600 & 900 MG/3ML INJECTION    Inject 1 kit into the muscle every 2 (two) months.   DOXYCYCLINE  (VIBRA -TABS) 100 MG TABLET    Take 2 tablets (200 mg total) by mouth after sex to prevent STIs.   ESCITALOPRAM  (LEXAPRO ) 20 MG TABLET    Take 1 tablet (20 mg total) by mouth daily.   MELOXICAM  (MOBIC ) 15 MG TABLET    Take 1 tablet (15 mg total) by mouth daily as needed for pain.   MONTELUKAST  (SINGULAIR ) 10 MG TABLET    Take 1 tablet (10 mg total) by mouth at bedtime.   NAPROXEN  (NAPROSYN ) 500 MG TABLET    Take 1 tablet (500 mg total) by mouth 2 (two) times daily with a meal.   ROSUVASTATIN  (CRESTOR ) 20 MG TABLET    Take 1 tablet (20 mg total) by mouth daily.  Modified Medications   No medications on file  Discontinued Medications   No medications on file    Allergies: No Known Allergies  Labs: Lab Results  Component Value Date   HIV1RNAQUANT NOT DETECTED 01/15/2024   HIV1RNAQUANT <20  DETECTED (A) 07/03/2023   HIV1RNAQUANT <20 DETECTED (A) 05/08/2023   CD4TABS 698 03/06/2023   CD4TABS 526 05/20/2022   CD4TABS 567 01/03/2022    RPR and STI Lab Results  Component Value Date   LABRPR REACTIVE (A) 01/15/2024   LABRPR REACTIVE (A) 11/06/2023   LABRPR REACTIVE (A) 09/04/2023   LABRPR REACTIVE (A) 07/03/2023   LABRPR REACTIVE (A) 03/06/2023   RPRTITER 1:4 (H) 01/15/2024   RPRTITER 1:2 (H) 11/06/2023   RPRTITER 1:2 (H) 09/04/2023   RPRTITER 1:4 (H) 07/03/2023   RPRTITER 1:2 (H) 03/06/2023    STI Results GC CT  01/15/2024  8:59 AM Negative    Positive    Negative  Negative    Negative    Negative   11/06/2023  9:15 AM Negative    Negative    Negative  Negative    Negative    Negative   03/06/2023  9:31 AM Negative    Negative    Negative  Negative    Negative    Negative   11/07/2022  9:53 AM Negative    Negative    Negative  Negative    Negative    Negative   09/05/2022  2:16 PM Negative    Negative    Negative  Negative    Negative    Negative  08/08/2022  9:18 AM Negative  Negative   07/11/2022  9:40 AM Negative    Positive    Negative  Negative    Negative    Negative   01/03/2022  9:06 AM Negative    Negative    Negative  Negative    Negative    Negative   09/06/2021  9:14 AM Negative  Negative   03/08/2021  9:16 AM Negative  Negative   11/16/2020 10:53 AM Negative  Negative   05/07/2019  3:08 PM Negative  Negative   11/20/2017 12:00 AM Negative  Negative   07/19/2016 12:00 AM Negative  Negative   09/28/2015 12:00 AM Negative  Negative     Hepatitis B Lab Results  Component Value Date   HEPBSAB REACTIVE (A) 09/06/2021   HEPBSAG NEG 07/13/2009   HEPBCAB NEG 07/13/2009   Hepatitis C No results found for: HEPCAB, HCVRNAPCRQN Hepatitis A Lab Results  Component Value Date   HAV POS (A) 07/13/2009   Lipids: Lab Results  Component Value Date   CHOL 200 (H) 01/15/2024   TRIG 72 01/15/2024   HDL 56 01/15/2024   CHOLHDL  3.6 01/15/2024   VLDL 14.2 07/27/2017   LDLCALC 128 (H) 01/15/2024    Target Date: 24th  Assessment: Rayquon presents today for his maintenance Cabenuva  injections. Past injections were tolerated well without issues. Last HIV RNA was undetectable on 01/15/2024. Doing well with no issues today.  Lab work:  ***Oral/urine/rectal cytologies for [employer], RPR ***   Eligible vaccinations: Covid ***  Cabenuva : Administered cabotegravir  600mg /86mL in left upper outer quadrant of the gluteal muscle. Administered rilpivirine  900 mg/3mL in the right upper outer quadrant of the gluteal muscle. No issues with injections. Leona will follow up in 2 months for next set of injections.  Plan: - Cabenuva  injections administered - Oral/urine/rectal cytologies for GC/Chlamydia, RPR ***  - Next injections scheduled for 05/06/2024 with Cassie, then 07/08/2024 with Dr. Fleeta Rothman  - Call with any issues or questions

## 2024-03-11 ENCOUNTER — Other Ambulatory Visit (HOSPITAL_COMMUNITY)
Admission: RE | Admit: 2024-03-11 | Discharge: 2024-03-11 | Disposition: A | Discharge status: Home / Self Care | Source: Ambulatory Visit | Attending: Infectious Disease | Admitting: Infectious Disease

## 2024-03-11 ENCOUNTER — Ambulatory Visit: Payer: Self-pay | Admitting: Pharmacist

## 2024-03-11 ENCOUNTER — Other Ambulatory Visit: Payer: Self-pay

## 2024-03-11 DIAGNOSIS — Z113 Encounter for screening for infections with a predominantly sexual mode of transmission: Secondary | ICD-10-CM | POA: Insufficient documentation

## 2024-03-11 DIAGNOSIS — B20 Human immunodeficiency virus [HIV] disease: Secondary | ICD-10-CM

## 2024-03-11 MED ORDER — CABOTEGRAVIR & RILPIVIRINE ER 600 & 900 MG/3ML IM SUER
1.0000 | Freq: Once | INTRAMUSCULAR | Status: AC
  Administered 2024-03-11: 1 via INTRAMUSCULAR

## 2024-03-11 NOTE — Progress Notes (Signed)
 The 10-year ASCVD risk score (Arnett DK, et al., 2019) is: 3.1%   Values used to calculate the score:     Age: 38 years     Clincally relevant sex: Male     Is Non-Hispanic African American: Yes     Diabetic: No     Tobacco smoker: No     Systolic Blood Pressure: 131 mmHg     Is BP treated: No     HDL Cholesterol: 56 mg/dL     Total Cholesterol: 200 mg/dL  Duwaine Lowe, BSN, RN

## 2024-03-12 LAB — CYTOLOGY, (ORAL, ANAL, URETHRAL) ANCILLARY ONLY
Chlamydia: NEGATIVE
Chlamydia: NEGATIVE
Comment: NEGATIVE
Comment: NEGATIVE
Comment: NORMAL
Comment: NORMAL
Neisseria Gonorrhea: NEGATIVE
Neisseria Gonorrhea: NEGATIVE

## 2024-03-12 LAB — URINE CYTOLOGY ANCILLARY ONLY
Chlamydia: NEGATIVE
Comment: NEGATIVE
Comment: NORMAL
Neisseria Gonorrhea: NEGATIVE

## 2024-03-13 LAB — SYPHILIS: RPR W/REFLEX TO RPR TITER AND TREPONEMAL ANTIBODIES, TRADITIONAL SCREENING AND DIAGNOSIS ALGORITHM: RPR Ser Ql: REACTIVE — AB

## 2024-03-13 LAB — T PALLIDUM AB: T Pallidum Abs: POSITIVE — AB

## 2024-03-13 LAB — RPR TITER: RPR Titer: 1:2 {titer} — ABNORMAL HIGH

## 2024-04-01 ENCOUNTER — Other Ambulatory Visit: Payer: Self-pay | Admitting: General Surgery

## 2024-04-03 LAB — SURGICAL PATHOLOGY

## 2024-04-25 ENCOUNTER — Ambulatory Visit: Admitting: Family Medicine

## 2024-04-30 ENCOUNTER — Other Ambulatory Visit: Payer: Self-pay

## 2024-04-30 ENCOUNTER — Other Ambulatory Visit (HOSPITAL_COMMUNITY): Payer: Self-pay

## 2024-04-30 NOTE — Progress Notes (Signed)
 Specialty Pharmacy Refill Coordination Note  Connor [name] is a 39 y.o. male assessed today regarding refills of clinic administered specialty medication(s) Cabotegravir  & Rilpivirine  (CABENUVA )   Clinic requested Courier to Provider Office   Delivery date: 05/02/24   Verified address: [address] [place] [address]   Medication will be filled on 05/01/24.

## 2024-05-01 ENCOUNTER — Other Ambulatory Visit: Payer: Self-pay

## 2024-05-02 ENCOUNTER — Telehealth: Payer: Self-pay

## 2024-05-02 NOTE — Telephone Encounter (Signed)
 [facility] Patient Advocate Encounter  Patient's medications CABENUVA  have been couriered to [facility] from [facility] and will be administered at the patients appointment on 05/06/24.  Charmaine Sharps, CPhT Specialty Pharmacy Patient [facility] for Infectious Disease Phone: [phone] Fax:  [phone]

## 2024-05-05 NOTE — Progress Notes (Unsigned)
 "  HPI: Connor [name] is a 39 y.o. male who presents to the [facility] pharmacy clinic for Cabenuva  administration.  Referring ID Provider: Dr. Fleeta Rothman  Patient Active Problem List   Diagnosis Date Noted   Routine screening for STI (sexually transmitted infection) 01/03/2022   Upper respiratory infection, acute 01/18/2021   Sore throat 01/18/2021   Cough 01/18/2021   Tinea corporis 03/05/2020   Prediabetes 09/13/2018   GERD (gastroesophageal reflux disease) 09/13/2018   Obesity (BMI 30-39.9) 06/01/2018   Vitamin D  deficiency 05/16/2016   Anxiety and depression 05/16/2016   Insomnia 06/28/2011   Human immunodeficiency virus (HIV) disease (HCC) 07/13/2009    Patient's Medications  New Prescriptions   No medications on file  Previous Medications   ATOMOXETINE  (STRATTERA ) 40 MG CAPSULE    Take 1 capsule (40 mg total) by mouth daily.   BACLOFEN  (LIORESAL ) 10 MG TABLET    Take 1 tablet (10 mg total) by mouth 3 (three) times daily as needed for muscle spasms.   CABOTEGRAVIR  & RILPIVIRINE  ER (CABENUVA ) 600 & 900 MG/3ML INJECTION    Inject 1 kit into the muscle every 2 (two) months.   DOXYCYCLINE  (VIBRA -TABS) 100 MG TABLET    Take 2 tablets (200 mg total) by mouth after sex to prevent STIs.   ESCITALOPRAM  (LEXAPRO ) 20 MG TABLET    Take 1 tablet (20 mg total) by mouth daily.   MELOXICAM  (MOBIC ) 15 MG TABLET    Take 1 tablet (15 mg total) by mouth daily as needed for pain.   MONTELUKAST  (SINGULAIR ) 10 MG TABLET    Take 1 tablet (10 mg total) by mouth at bedtime.   NAPROXEN  (NAPROSYN ) 500 MG TABLET    Take 1 tablet (500 mg total) by mouth 2 (two) times daily with a meal.   ROSUVASTATIN  (CRESTOR ) 20 MG TABLET    Take 1 tablet (20 mg total) by mouth daily.  Modified Medications   No medications on file  Discontinued Medications   No medications on file    Allergies: Allergies[1]  Labs: Lab Results  Component Value Date   HIV1RNAQUANT NOT DETECTED 01/15/2024   HIV1RNAQUANT <20 DETECTED  (A) 07/03/2023   HIV1RNAQUANT <20 DETECTED (A) 05/08/2023   CD4TABS 698 03/06/2023   CD4TABS 526 05/20/2022   CD4TABS 567 01/03/2022    RPR and STI Lab Results  Component Value Date   LABRPR REACTIVE (A) 03/11/2024   LABRPR REACTIVE (A) 01/15/2024   LABRPR REACTIVE (A) 11/06/2023   LABRPR REACTIVE (A) 09/04/2023   LABRPR REACTIVE (A) 07/03/2023   RPRTITER 1:2 (H) 03/11/2024   RPRTITER 1:4 (H) 01/15/2024   RPRTITER 1:2 (H) 11/06/2023   RPRTITER 1:2 (H) 09/04/2023   RPRTITER 1:4 (H) 07/03/2023    STI Results GC CT  03/11/2024  9:17 AM Negative    Negative    Negative  Negative    Negative    Negative   01/15/2024  8:59 AM Negative    Positive    Negative  Negative    Negative    Negative   11/06/2023  9:15 AM Negative    Negative    Negative  Negative    Negative    Negative   03/06/2023  9:31 AM Negative    Negative    Negative  Negative    Negative    Negative   11/07/2022  9:53 AM Negative    Negative    Negative  Negative    Negative    Negative  09/05/2022  2:16 PM Negative    Negative    Negative  Negative    Negative    Negative   08/08/2022  9:18 AM Negative  Negative   07/11/2022  9:40 AM Negative    Positive    Negative  Negative    Negative    Negative   01/03/2022  9:06 AM Negative    Negative    Negative  Negative    Negative    Negative   09/06/2021  9:14 AM Negative  Negative   03/08/2021  9:16 AM Negative  Negative   11/16/2020 10:53 AM Negative  Negative   05/07/2019  3:08 PM Negative  Negative   11/20/2017 12:00 AM Negative  Negative   07/19/2016 12:00 AM Negative  Negative   09/28/2015 12:00 AM Negative  Negative     Hepatitis B Lab Results  Component Value Date   HEPBSAB REACTIVE (A) 09/06/2021   HEPBSAG NEG 07/13/2009   HEPBCAB NEG 07/13/2009   Hepatitis C No results found for: HEPCAB, HCVRNAPCRQN Hepatitis A Lab Results  Component Value Date   HAV POS (A) 07/13/2009   Lipids: Lab Results  Component Value  Date   CHOL 200 (H) 01/15/2024   TRIG 72 01/15/2024   HDL 56 01/15/2024   CHOLHDL 3.6 01/15/2024   VLDL 85.7 07/27/2017   LDLCALC 128 (H) 01/15/2024    Target Date: 24th  Assessment: Connor [name] presents today for his maintenance Cabenuva  injections. Past injections were tolerated well without issues. Last HIV RNA was undetectable in September. Doing well with no issues today.  Last lipid panel in September revealed slightly elevated total cholesterol at 200 and elevated LDL at 128. Connor [name] is prescribed a high-intensity statin, although fill history is only intermittent (last fill at end of September for 30-day supply). I asked if he has had any issues with it. He says he has not really been taking it. He hasn't had any side effects or issues accessing it, but it has been hard to remember to take it every single day. I told him I agreed that can be hard, but emphasized the importance of taking it every day as he is able due to his high cholesterol and HIV disease increasing the risk for cardiovascular complications. He expressed understanding.   Lab work:  Offered STI testing; accepts oral, rectal, urine, and RPR testing  Eligible vaccinations:  Offered COVID-19 vaccine; Connor [name] politely declines today.   Cabenuva : Administered cabotegravir  600mg /39mL in left upper outer quadrant of the gluteal muscle. Administered rilpivirine  900 mg/3mL in the right upper outer quadrant of the gluteal muscle. No issues with injections. Connor [name] will follow up in 2 months for next set of injections.  Plan: - Cabenuva  injections administered - STI testing today - Discussed importance of taking rosuvastatin  every day - Next injections scheduled for 3/23 with Dr. Fleeta Rothman, then 5/18 with Connor [name] - Call with any issues or questions  Connor [name], PharmD PGY1 Pharmacy Resident [facility] 05/05/2024 10:12 AM     [1] No Known Allergies  "

## 2024-05-06 ENCOUNTER — Other Ambulatory Visit: Payer: Self-pay

## 2024-05-06 ENCOUNTER — Ambulatory Visit: Payer: Self-pay | Admitting: Pharmacist

## 2024-05-06 ENCOUNTER — Other Ambulatory Visit (HOSPITAL_COMMUNITY)
Admission: RE | Admit: 2024-05-06 | Discharge: 2024-05-06 | Disposition: A | Discharge status: Home / Self Care | Source: Ambulatory Visit | Attending: Infectious Disease | Admitting: Infectious Disease

## 2024-05-06 DIAGNOSIS — B20 Human immunodeficiency virus [HIV] disease: Secondary | ICD-10-CM

## 2024-05-06 DIAGNOSIS — Z113 Encounter for screening for infections with a predominantly sexual mode of transmission: Secondary | ICD-10-CM

## 2024-05-06 MED ORDER — CABOTEGRAVIR & RILPIVIRINE ER 600 & 900 MG/3ML IM SUER
1.0000 | Freq: Once | INTRAMUSCULAR | Status: AC
  Administered 2024-05-06: 1 via INTRAMUSCULAR

## 2024-05-06 NOTE — Progress Notes (Signed)
 Connor [name]

## 2024-05-07 LAB — URINE CYTOLOGY ANCILLARY ONLY
Chlamydia: NEGATIVE
Comment: NEGATIVE
Comment: NORMAL
Neisseria Gonorrhea: NEGATIVE

## 2024-05-07 LAB — CYTOLOGY, (ORAL, ANAL, URETHRAL) ANCILLARY ONLY
Chlamydia: NEGATIVE
Chlamydia: NEGATIVE
Comment: NEGATIVE
Comment: NEGATIVE
Comment: NORMAL
Comment: NORMAL
Neisseria Gonorrhea: NEGATIVE
Neisseria Gonorrhea: NEGATIVE

## 2024-05-08 LAB — SYPHILIS: RPR W/REFLEX TO RPR TITER AND TREPONEMAL ANTIBODIES, TRADITIONAL SCREENING AND DIAGNOSIS ALGORITHM: RPR Ser Ql: REACTIVE — AB

## 2024-05-08 LAB — RPR TITER: RPR Titer: 1:2 {titer} — ABNORMAL HIGH

## 2024-05-08 LAB — T PALLIDUM AB: T Pallidum Abs: POSITIVE — AB

## 2024-07-08 ENCOUNTER — Ambulatory Visit: Payer: Self-pay | Admitting: Infectious Disease

## 2024-09-02 ENCOUNTER — Ambulatory Visit: Payer: Self-pay | Admitting: Pharmacist
